# Patient Record
Sex: Male | Born: 1945 | Race: White | Hispanic: No | Marital: Married | Smoking: Former smoker
Health system: Southern US, Community
[De-identification: ages and names within clinical notes are randomized; demographics above are authoritative.]

## PROBLEM LIST (undated history)

## (undated) DIAGNOSIS — E785 Hyperlipidemia, unspecified: Secondary | ICD-10-CM

## (undated) DIAGNOSIS — R519 Headache, unspecified: Secondary | ICD-10-CM

## (undated) DIAGNOSIS — I10 Essential (primary) hypertension: Secondary | ICD-10-CM

## (undated) DIAGNOSIS — G8929 Other chronic pain: Secondary | ICD-10-CM

## (undated) DIAGNOSIS — G4700 Insomnia, unspecified: Secondary | ICD-10-CM

## (undated) DIAGNOSIS — N189 Chronic kidney disease, unspecified: Secondary | ICD-10-CM

## (undated) DIAGNOSIS — H269 Unspecified cataract: Secondary | ICD-10-CM

## (undated) DIAGNOSIS — C61 Malignant neoplasm of prostate: Secondary | ICD-10-CM

## (undated) DIAGNOSIS — M542 Cervicalgia: Secondary | ICD-10-CM

## (undated) DIAGNOSIS — B019 Varicella without complication: Secondary | ICD-10-CM

## (undated) DIAGNOSIS — M1990 Unspecified osteoarthritis, unspecified site: Secondary | ICD-10-CM

## (undated) DIAGNOSIS — M549 Dorsalgia, unspecified: Secondary | ICD-10-CM

## (undated) DIAGNOSIS — G709 Myoneural disorder, unspecified: Secondary | ICD-10-CM

## (undated) DIAGNOSIS — C3490 Malignant neoplasm of unspecified part of unspecified bronchus or lung: Secondary | ICD-10-CM

## (undated) DIAGNOSIS — R51 Headache: Secondary | ICD-10-CM

## (undated) DIAGNOSIS — Z87442 Personal history of urinary calculi: Secondary | ICD-10-CM

## (undated) HISTORY — DX: Headache: R51

## (undated) HISTORY — DX: Unspecified osteoarthritis, unspecified site: M19.90

## (undated) HISTORY — DX: Varicella without complication: B01.9

## (undated) HISTORY — DX: Unspecified cataract: H26.9

## (undated) HISTORY — DX: Insomnia, unspecified: G47.00

## (undated) HISTORY — DX: Other chronic pain: G89.29

## (undated) HISTORY — PX: PROSTATECTOMY: SHX69

## (undated) HISTORY — DX: Dorsalgia, unspecified: M54.9

## (undated) HISTORY — DX: Essential (primary) hypertension: I10

## (undated) HISTORY — DX: Personal history of urinary calculi: Z87.442

## (undated) HISTORY — DX: Cervicalgia: M54.2

## (undated) HISTORY — DX: Myoneural disorder, unspecified: G70.9

## (undated) HISTORY — DX: Hyperlipidemia, unspecified: E78.5

## (undated) HISTORY — DX: Chronic kidney disease, unspecified: N18.9

## (undated) HISTORY — DX: Malignant neoplasm of prostate: C61

---
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
## 1972-02-26 HISTORY — PX: BACK SURGERY: SHX140

## 1997-06-09 ENCOUNTER — Other Ambulatory Visit: Admission: RE | Admit: 1997-06-09 | Discharge: 1997-06-09 | Payer: Self-pay | Admitting: Urology

## 1997-07-13 ENCOUNTER — Inpatient Hospital Stay (HOSPITAL_COMMUNITY): Admission: RE | Admit: 1997-07-13 | Discharge: 1997-07-20 | Payer: Self-pay | Admitting: Urology

## 1997-09-22 ENCOUNTER — Ambulatory Visit (HOSPITAL_COMMUNITY): Admission: RE | Admit: 1997-09-22 | Discharge: 1997-09-22 | Payer: Self-pay | Admitting: Urology

## 2000-02-26 HISTORY — PX: NECK SURGERY: SHX720

## 2001-02-05 ENCOUNTER — Encounter: Payer: Self-pay | Admitting: Neurosurgery

## 2001-02-09 ENCOUNTER — Encounter: Payer: Self-pay | Admitting: Neurosurgery

## 2001-02-09 ENCOUNTER — Inpatient Hospital Stay (HOSPITAL_COMMUNITY): Admission: RE | Admit: 2001-02-09 | Discharge: 2001-02-10 | Payer: Self-pay | Admitting: Neurosurgery

## 2007-10-09 ENCOUNTER — Encounter: Admission: RE | Admit: 2007-10-09 | Discharge: 2007-10-09 | Payer: Self-pay | Admitting: Nephrology

## 2010-07-13 NOTE — Op Note (Signed)
[place]  Patient:    Jerry Wong, Jerry Wong Visit Number: [id number] MRN: [id number]          Service Type: SUR Location: [id number] Attending Physician:  Barton Fanny Dictated by:   Hewitt Shorts, M.D. Proc. Date: 02/09/01 Admit Date:  02/09/2001                             Operative Report  PREOPERATIVE DIAGNOSIS:  Cervical myelopathy, spondylosis, degenerative disk disease and disk herniation.  POSTOPERATIVE DIAGNOSIS:  Cervical myelopathy, spondylosis, degenerative disk disease and disk herniation.  OPERATION PERFORMED:  C4-5 anterior cervical diskectomy and arthrodesis with iliac crest allograft and Trinica cervical plating.  SURGEON:  Hewitt Shorts, M.D.  ASSISTANT:  Payton Doughty, M.D.  ANESTHESIA:  General endotracheal.  INDICATIONS FOR PROCEDURE:  The patient is a 65 year old man who presented with myelopathy, quadriparesis with hyperreflexia and clonus.  MRI revealed significant compression of the cord at the C4-5 level with increased signal ____________ in the cord.  A decision was made to proceed with a single level anterior cervical diskectomy and arthrodesis.  DESCRIPTION OF PROCEDURE:  The patient was brought to the operating room and placed under general endotracheal anesthesia.  The patient was placed in 10 pounds of halter traction and the neck was prepped with Betadine soap and solution and draped in sterile fashion.  A horizontal was made in the left side of the neck.  The line of the incision was infiltrated with local anesthetic with epinephrine.  Incision was made with a Shaw scalpel at a temperature of 120.  Dissection was carried down through subcutaneous tissues and platysma.  Dissection was carried down to an avascular plane leaving the sternocleidomastoid, carotid artery and jugular vein laterally and trachea and esophagus medially.  The ventral aspect of the vertebral column was identified and a  localizing x-ray was taken.  The C4-5 disk space was identified and anterior osteophytic overgrowth was removed and the disk space entered. n The microscope was draped and brought into the field to provide additional magnification, illumination and visualization.  The remainder of the procedure was performed using microdissection and microsurgical technique.  There was significant spondylitic degeneration of the disk space.  Using a Micromax drill, the cauterized end plates of the vertebral bodies were removed. Posterior osteophytic overgrowth was removed using a Micromax drill and then a 2 mm Kerrison punch with a thin foot plate and posterior longitudinal ligament was removed and in the end, good decompression of the thecal sac and foramina was achieved.  The wound was irrigated with bacitracin solution throughout the procedure.  Once the decompression was completed, we proceeded with the arthrodesis.  A 7 mm wedge of iliac crest allograft was cut and shaped to size and positioned in the intervertebral disk space and countersunk.  A 24 mm Trinica cervical plate was selected and positioned over the fusion construct and secured to the vertebra with a pair of 4.2 x 16 mm screws in each vertebra. Each screw hole was drilled, tapped and the screw placed.  Once all four screws were secured, the locking system was secured and then the wound was irrigated with bacitracin solution, checked for hemostasis once again.  An x-ray was taken which showed the graft in position, the plate and screws in position and the overall alignment was good.  We then proceeded with closure. The platysma was closed with interrupted inverted 2-0  undyed Vicryl sutures. The subcutaneous and subcuticular layer were closed with interrupted inverted 3-0 undyed Vicryl sutures and the skin was approximated with Dermabond.  The patient tolerated the procedure well. Estimated blood loss for this procedure was less than 50 cc.    Sponge, needle and instrument counts were correct. Following surgery the patient was placed in a soft cervical collar.  His traction had been discontinued. He was reversed from anesthetic, extubated and transferred to recovery room for further care where he was noted to be moving all four extremities. Dictated by:   Hewitt Shorts, M.D. Attending Physician:  Barton Fanny DD:  02/09/01 TD:  02/09/01 Job: [id number]

## 2012-08-06 ENCOUNTER — Other Ambulatory Visit: Payer: Self-pay | Admitting: Neurology

## 2012-08-21 ENCOUNTER — Ambulatory Visit (INDEPENDENT_AMBULATORY_CARE_PROVIDER_SITE_OTHER): Payer: Medicare Other | Admitting: Nurse Practitioner

## 2012-08-21 ENCOUNTER — Encounter: Payer: Self-pay | Admitting: Nurse Practitioner

## 2012-08-21 VITALS — BP 111/70 | HR 72 | Ht 69.0 in | Wt 207.0 lb

## 2012-08-21 DIAGNOSIS — G8929 Other chronic pain: Secondary | ICD-10-CM | POA: Insufficient documentation

## 2012-08-21 DIAGNOSIS — M4802 Spinal stenosis, cervical region: Secondary | ICD-10-CM

## 2012-08-21 DIAGNOSIS — G43109 Migraine with aura, not intractable, without status migrainosus: Secondary | ICD-10-CM

## 2012-08-21 DIAGNOSIS — M542 Cervicalgia: Secondary | ICD-10-CM | POA: Insufficient documentation

## 2012-08-21 MED ORDER — TIZANIDINE HCL 4 MG PO CAPS: 4.0000 mg | ORAL_CAPSULE | Freq: Two times a day (BID) | ORAL | Status: DC

## 2012-08-21 MED ORDER — AMITRIPTYLINE HCL 75 MG PO TABS: 75.0000 mg | ORAL_TABLET | Freq: Every day | ORAL | Status: DC

## 2012-08-21 NOTE — Patient Instructions (Addendum)
Continue tizanidine 4 mg twice daily Continue amitriptyline 75 daily Continue neck exercises Followup yearly and when necessary

## 2012-08-21 NOTE — Progress Notes (Signed)
HPI: He returns for followup after last visit 04/04/11.  He has a history of long-standing headaches which have responded well to amitriptyline and Tizanidine.    He has a history of high blood pressure and gout.  He has  attempted to wean himself off his medications and severe headaches would return. Has history of neck surgery and back surgery in the past. Has occasional tingling and numbness that is very intermittent, difficulty walking but he is not using any type of assistive device. Headaches are well controlled, needs refills  ROS:  Negative  Physical Exam General: well developed, well nourished, seated, in no evident distress Head: head normocephalic and atraumatic. Oropharynx benign Neck: supple with no carotid  bruits Cardiovascular: regular rate and rhythm, no murmurs  Neurologic Exam Mental Status: Awake and fully alert. Oriented to place and time. Follows all commands. Speech and language normal.   Cranial Nerves: . Pupils equal, briskly reactive to light. Extraocular movements full without nystagmus. Visual fields full to confrontation. Hearing intact and symmetric to finger snap. Facial sensation intact. Face, tongue, palate move normally and symmetrically. Neck flexion and extension normal.  Motor: Normal bulk and tone. Normal strength in all tested extremity muscles.No focal weakness Sensory.: intact to touch and pinprick and vibratory.  Coordination: Rapid alternating movements normal in all extremities. Finger-to-nose and heel-to-shin performed accurately bilaterally. Gait and Station: Arises from chair without difficulty. Stance is normal. Gait demonstrates normal stride length and balance . Able to heel, toe and tandem walk without difficulty.  Reflexes: 1+ and symmetric. Toes downgoing.     ASSESSMENT: Headache, DDD, spinal stenosis     PLAN: Continue tizanidine 4 mg twice daily Continue amitriptyline 75 daily Continue neck exercises Followup yearly and when  necessary Nilda Riggs, GNP-BC APRN

## 2012-10-02 ENCOUNTER — Other Ambulatory Visit: Payer: Self-pay

## 2012-10-02 MED ORDER — AMITRIPTYLINE HCL 75 MG PO TABS: 75.0000 mg | ORAL_TABLET | Freq: Every day | ORAL | Status: DC

## 2012-10-02 NOTE — Telephone Encounter (Signed)
Per pharmacy, patient requests 90 day Rx.  

## 2012-11-30 ENCOUNTER — Other Ambulatory Visit: Payer: Self-pay | Admitting: Urology

## 2012-11-30 ENCOUNTER — Ambulatory Visit
Admission: RE | Admit: 2012-11-30 | Discharge: 2012-11-30 | Disposition: A | Discharge status: Home / Self Care | Payer: Medicare Other | Source: Ambulatory Visit | Attending: Urology | Admitting: Urology

## 2012-11-30 DIAGNOSIS — Z8546 Personal history of malignant neoplasm of prostate: Secondary | ICD-10-CM

## 2013-05-11 DIAGNOSIS — G894 Chronic pain syndrome: Secondary | ICD-10-CM | POA: Diagnosis not present

## 2013-05-11 DIAGNOSIS — Z79899 Other long term (current) drug therapy: Secondary | ICD-10-CM | POA: Diagnosis not present

## 2013-05-11 DIAGNOSIS — M961 Postlaminectomy syndrome, not elsewhere classified: Secondary | ICD-10-CM | POA: Diagnosis not present

## 2013-07-06 DIAGNOSIS — M961 Postlaminectomy syndrome, not elsewhere classified: Secondary | ICD-10-CM | POA: Diagnosis not present

## 2013-07-06 DIAGNOSIS — G894 Chronic pain syndrome: Secondary | ICD-10-CM | POA: Diagnosis not present

## 2013-07-29 ENCOUNTER — Other Ambulatory Visit: Payer: Self-pay

## 2013-07-29 MED ORDER — TIZANIDINE HCL 4 MG PO CAPS: 4.0000 mg | ORAL_CAPSULE | Freq: Two times a day (BID) | ORAL | Status: DC

## 2013-08-09 DIAGNOSIS — C61 Malignant neoplasm of prostate: Secondary | ICD-10-CM | POA: Diagnosis not present

## 2013-08-09 DIAGNOSIS — R972 Elevated prostate specific antigen [PSA]: Secondary | ICD-10-CM | POA: Diagnosis not present

## 2013-08-09 DIAGNOSIS — N200 Calculus of kidney: Secondary | ICD-10-CM | POA: Diagnosis not present

## 2013-08-18 ENCOUNTER — Other Ambulatory Visit: Payer: Self-pay

## 2013-08-18 MED ORDER — AMITRIPTYLINE HCL 75 MG PO TABS: 75.0000 mg | ORAL_TABLET | Freq: Every day | ORAL | Status: DC

## 2013-08-24 ENCOUNTER — Encounter: Payer: Self-pay | Admitting: Neurology

## 2013-08-24 ENCOUNTER — Ambulatory Visit (INDEPENDENT_AMBULATORY_CARE_PROVIDER_SITE_OTHER): Payer: Medicare Other | Admitting: Neurology

## 2013-08-24 ENCOUNTER — Encounter (INDEPENDENT_AMBULATORY_CARE_PROVIDER_SITE_OTHER): Payer: Self-pay

## 2013-08-24 ENCOUNTER — Ambulatory Visit: Payer: Medicare Other | Admitting: Neurology

## 2013-08-24 VITALS — BP 122/68 | HR 72 | Resp 18 | Ht 67.75 in | Wt 201.0 lb

## 2013-08-24 DIAGNOSIS — R51 Headache: Secondary | ICD-10-CM | POA: Diagnosis not present

## 2013-08-24 DIAGNOSIS — Z5181 Encounter for therapeutic drug level monitoring: Secondary | ICD-10-CM | POA: Diagnosis not present

## 2013-08-24 DIAGNOSIS — R519 Headache, unspecified: Secondary | ICD-10-CM | POA: Insufficient documentation

## 2013-08-24 MED ORDER — AMITRIPTYLINE HCL 75 MG PO TABS: 75.0000 mg | ORAL_TABLET | Freq: Every day | ORAL | Status: DC

## 2013-08-24 MED ORDER — TIZANIDINE HCL 4 MG PO CAPS: 4.0000 mg | ORAL_CAPSULE | Freq: Two times a day (BID) | ORAL | Status: DC

## 2013-08-24 NOTE — Patient Instructions (Signed)
Spinal Stenosis Spinal stenosis is an abnormal narrowing of the canals of your spine (vertebrae). CAUSES  Spinal stenosis is caused by areas of bone pushing into the central canals of your vertebrae. This condition can be present at birth (congenital). It also may be caused by arthritic deterioration of your vertebrae (spinal degeneration).  SYMPTOMS   Pain that is generally worse with activities, particularly standing and walking.  Numbness, tingling, hot or cold sensations, weakness, or weariness in your legs.  Frequent episodes of falling.  A foot-slapping gait that leads to muscle weakness. DIAGNOSIS  Spinal stenosis is diagnosed with the use of magnetic resonance imaging (MRI) or computed tomography (CT). TREATMENT  Initial therapy for spinal stenosis focuses on the management of the pain and other symptoms associated with the condition. These therapies include:  Practicing postural changes to lessen pressure on your nerves.  Exercises to strengthen the core of your body.  Loss of excess body weight.  The use of nonsteroidal anti-inflammatory medications to reduce swelling and inflammation in your nerves. When therapies to manage pain are not successful, surgery to treat spinal stenosis may be recommended. This surgery involves removing excess bone, which puts pressure on your nerve roots. During this surgery (laminectomy), the posterior boney arch (lamina) and excess bone around the facet joints are removed. Document Released: 05/04/2003 Document Revised: 06/08/2012 Document Reviewed: 05/22/2012 [facility] Patient Information 2015 [place], [place]. This information is not intended to replace advice given to you by your health care provider. Make sure you discuss any questions you have with your health care provider.

## 2013-08-24 NOTE — Progress Notes (Signed)
HPI: He returns for followup after last visit 08/21/12 with Cecille Rubin, [place].  This caucasian, married, right handed male patient has a history of long-standing headaches which have responded well to amitriptyline and Tizanidine.     He has attempted to wean himself off his medications and severe headaches would return. Has history of neck surgery and back surgery in the past. He has a history of high blood pressure and gout- both controlled on medication.  He underwent additional tests for back pain in the last year, followed by his Urologist. This patient has prostate cancer.     ROS:  Has occasional tingling and numbness in feet fingers and hands, very intermittent, difficulty walking- neck surgery .  but he is not using any type of assistive device.   Headaches are well controlled, needs refills   Physical Exam General: well developed, well nourished, seated, in no evident distress Head: head normocephalic and atraumatic. Oropharynx benign Neck: supple with no carotid Bruits, neck circumference 14.5 inches. [place] 3  Cardiovascular: regular rate and rhythm, no murmurs.  Neurologic Exam Mental Status: Awake and fully alert. Oriented to place and time. Follows all commands. Speech and language normal.   Cranial Nerves: . Pupils equal, briskly reactive to light. Extraocular movements full without nystagmus. Visual fields full to confrontation. Hearing intact and symmetric to finger snap. Facial sensation intact. Face, tongue, palate move normally and symmetrically. Neck flexion and extension normal.  Motor: Normal bulk and tone. Normal strength in all tested extremity muscles.No focal weakness Sensory.: intact to touch and pinprick and vibratory.  Coordination: Rapid alternating movements normal in all extremities.  Finger-to-nose and heel-to-shin performed accurately bilaterally. Gait and Station: Arises from chair without difficulty. Stance is normal.  Gait demonstrates normal  stride length and balance . ty.  Reflexes: 1+ and symmetric. Toes downgoing.     ASSESSMENT: Headache, resolved on current medications- needs refills,  DDD, spinal stenosis     PLAN: Continue tizanidine 4 mg twice daily Continue amitriptyline 75 daily Continue neck exercises Followup yearly and when necessary Larey Seat, MD

## 2013-08-30 ENCOUNTER — Telehealth: Payer: Self-pay | Admitting: Neurology

## 2013-08-30 NOTE — Telephone Encounter (Signed)
I called back.  Got no answer.  Left message asking if his ins has a preferred drug listed that they will cover.

## 2013-08-30 NOTE — Telephone Encounter (Signed)
Please advise. Thanks.  

## 2013-08-30 NOTE — Telephone Encounter (Signed)
Patient called regarding his prescription of  tizanidine 4 mg twice daily - his insurance will not cover this med - $189 self pay. He would like something else called in. Please call to advise

## 2013-08-31 DIAGNOSIS — M961 Postlaminectomy syndrome, not elsewhere classified: Secondary | ICD-10-CM | POA: Diagnosis not present

## 2013-08-31 DIAGNOSIS — G894 Chronic pain syndrome: Secondary | ICD-10-CM | POA: Diagnosis not present

## 2013-08-31 DIAGNOSIS — Z79899 Other long term (current) drug therapy: Secondary | ICD-10-CM | POA: Diagnosis not present

## 2013-08-31 MED ORDER — TIZANIDINE HCL 4 MG PO TABS: 4.0000 mg | ORAL_TABLET | Freq: Two times a day (BID) | ORAL | Status: DC

## 2013-08-31 NOTE — Telephone Encounter (Signed)
Rx has been sent to [facility].  I called the patient back, got no answer at home.  Called cell and was told he was not at that number.  I called pharmacy and asked they contact the patient when Rx is ready for pick up.

## 2013-08-31 NOTE — Telephone Encounter (Signed)
Patient called and stated insurance will cover Tizanidine 4 mg in tablets only.  Rx was written for capsules.  Please send Rx to [facility] on [address].  Please call and advise

## 2013-10-25 DIAGNOSIS — N182 Chronic kidney disease, stage 2 (mild): Secondary | ICD-10-CM | POA: Diagnosis not present

## 2013-10-25 DIAGNOSIS — M109 Gout, unspecified: Secondary | ICD-10-CM | POA: Diagnosis not present

## 2013-10-25 DIAGNOSIS — E785 Hyperlipidemia, unspecified: Secondary | ICD-10-CM | POA: Diagnosis not present

## 2013-10-25 DIAGNOSIS — I129 Hypertensive chronic kidney disease with stage 1 through stage 4 chronic kidney disease, or unspecified chronic kidney disease: Secondary | ICD-10-CM | POA: Diagnosis not present

## 2013-10-26 DIAGNOSIS — Z79899 Other long term (current) drug therapy: Secondary | ICD-10-CM | POA: Diagnosis not present

## 2013-10-26 DIAGNOSIS — M961 Postlaminectomy syndrome, not elsewhere classified: Secondary | ICD-10-CM | POA: Diagnosis not present

## 2013-10-26 DIAGNOSIS — G894 Chronic pain syndrome: Secondary | ICD-10-CM | POA: Diagnosis not present

## 2013-12-21 DIAGNOSIS — M47816 Spondylosis without myelopathy or radiculopathy, lumbar region: Secondary | ICD-10-CM | POA: Diagnosis not present

## 2013-12-21 DIAGNOSIS — M47812 Spondylosis without myelopathy or radiculopathy, cervical region: Secondary | ICD-10-CM | POA: Diagnosis not present

## 2013-12-21 DIAGNOSIS — M961 Postlaminectomy syndrome, not elsewhere classified: Secondary | ICD-10-CM | POA: Diagnosis not present

## 2013-12-21 DIAGNOSIS — G894 Chronic pain syndrome: Secondary | ICD-10-CM | POA: Diagnosis not present

## 2013-12-27 DIAGNOSIS — N183 Chronic kidney disease, stage 3 (moderate): Secondary | ICD-10-CM | POA: Diagnosis not present

## 2014-02-15 DIAGNOSIS — M47816 Spondylosis without myelopathy or radiculopathy, lumbar region: Secondary | ICD-10-CM | POA: Diagnosis not present

## 2014-02-15 DIAGNOSIS — G894 Chronic pain syndrome: Secondary | ICD-10-CM | POA: Diagnosis not present

## 2014-02-15 DIAGNOSIS — M47812 Spondylosis without myelopathy or radiculopathy, cervical region: Secondary | ICD-10-CM | POA: Diagnosis not present

## 2014-02-15 DIAGNOSIS — M961 Postlaminectomy syndrome, not elsewhere classified: Secondary | ICD-10-CM | POA: Diagnosis not present

## 2014-02-28 DIAGNOSIS — R7309 Other abnormal glucose: Secondary | ICD-10-CM | POA: Diagnosis not present

## 2014-02-28 DIAGNOSIS — E782 Mixed hyperlipidemia: Secondary | ICD-10-CM | POA: Diagnosis not present

## 2014-02-28 DIAGNOSIS — Z23 Encounter for immunization: Secondary | ICD-10-CM | POA: Diagnosis not present

## 2014-02-28 DIAGNOSIS — N183 Chronic kidney disease, stage 3 (moderate): Secondary | ICD-10-CM | POA: Diagnosis not present

## 2014-02-28 DIAGNOSIS — C61 Malignant neoplasm of prostate: Secondary | ICD-10-CM | POA: Diagnosis not present

## 2014-02-28 DIAGNOSIS — M109 Gout, unspecified: Secondary | ICD-10-CM | POA: Diagnosis not present

## 2014-02-28 DIAGNOSIS — I129 Hypertensive chronic kidney disease with stage 1 through stage 4 chronic kidney disease, or unspecified chronic kidney disease: Secondary | ICD-10-CM | POA: Diagnosis not present

## 2014-02-28 DIAGNOSIS — Z1389 Encounter for screening for other disorder: Secondary | ICD-10-CM | POA: Diagnosis not present

## 2014-02-28 DIAGNOSIS — Z1211 Encounter for screening for malignant neoplasm of colon: Secondary | ICD-10-CM | POA: Diagnosis not present

## 2014-04-12 DIAGNOSIS — G894 Chronic pain syndrome: Secondary | ICD-10-CM | POA: Diagnosis not present

## 2014-04-12 DIAGNOSIS — M47816 Spondylosis without myelopathy or radiculopathy, lumbar region: Secondary | ICD-10-CM | POA: Diagnosis not present

## 2014-04-12 DIAGNOSIS — M47812 Spondylosis without myelopathy or radiculopathy, cervical region: Secondary | ICD-10-CM | POA: Diagnosis not present

## 2014-04-12 DIAGNOSIS — M961 Postlaminectomy syndrome, not elsewhere classified: Secondary | ICD-10-CM | POA: Diagnosis not present

## 2014-06-07 DIAGNOSIS — M47816 Spondylosis without myelopathy or radiculopathy, lumbar region: Secondary | ICD-10-CM | POA: Diagnosis not present

## 2014-06-07 DIAGNOSIS — M961 Postlaminectomy syndrome, not elsewhere classified: Secondary | ICD-10-CM | POA: Diagnosis not present

## 2014-06-07 DIAGNOSIS — M47812 Spondylosis without myelopathy or radiculopathy, cervical region: Secondary | ICD-10-CM | POA: Diagnosis not present

## 2014-06-07 DIAGNOSIS — G894 Chronic pain syndrome: Secondary | ICD-10-CM | POA: Diagnosis not present

## 2014-07-19 ENCOUNTER — Other Ambulatory Visit: Payer: Self-pay | Admitting: Neurology

## 2014-07-19 NOTE — Telephone Encounter (Signed)
Has appt scheduled.

## 2014-08-02 DIAGNOSIS — G894 Chronic pain syndrome: Secondary | ICD-10-CM | POA: Diagnosis not present

## 2014-08-02 DIAGNOSIS — Z79891 Long term (current) use of opiate analgesic: Secondary | ICD-10-CM | POA: Diagnosis not present

## 2014-08-02 DIAGNOSIS — M47812 Spondylosis without myelopathy or radiculopathy, cervical region: Secondary | ICD-10-CM | POA: Diagnosis not present

## 2014-08-02 DIAGNOSIS — M47816 Spondylosis without myelopathy or radiculopathy, lumbar region: Secondary | ICD-10-CM | POA: Diagnosis not present

## 2014-08-07 ENCOUNTER — Other Ambulatory Visit: Payer: Self-pay | Admitting: Neurology

## 2014-08-25 ENCOUNTER — Ambulatory Visit (INDEPENDENT_AMBULATORY_CARE_PROVIDER_SITE_OTHER): Payer: Medicare Other | Admitting: Nurse Practitioner

## 2014-08-25 ENCOUNTER — Encounter: Payer: Self-pay | Admitting: Nurse Practitioner

## 2014-08-25 VITALS — BP 117/70 | HR 64 | Ht 69.0 in | Wt 185.2 lb

## 2014-08-25 DIAGNOSIS — M4802 Spinal stenosis, cervical region: Secondary | ICD-10-CM

## 2014-08-25 DIAGNOSIS — G43109 Migraine with aura, not intractable, without status migrainosus: Secondary | ICD-10-CM

## 2014-08-25 MED ORDER — TIZANIDINE HCL 4 MG PO TABS: 4.0000 mg | ORAL_TABLET | Freq: Two times a day (BID) | ORAL | Status: DC

## 2014-08-25 MED ORDER — AMITRIPTYLINE HCL 75 MG PO TABS: ORAL_TABLET | ORAL | Status: DC

## 2014-08-25 NOTE — Progress Notes (Addendum)
[facility]  PATIENT: Jerry Wong DOB: [birth year]   REASON FOR VISIT: follow-up for history of headaches HISTORY FROM:Patient    HISTORY OF PRESENT ILLNESS:Jerry Wong, 69 year old male returns for follow-up.He has a history of long-standing headaches which have responded well to amitriptyline and Tizanidine.  He has attempted to wean himself off his medications and severe headaches would return.Has history of neck surgery and back surgery in the past. He has a history of high blood pressure and gout- both controlled on medication. He underwent additional tests for back pain in the last year.  He complains with his left eye drooping at times, he denies any double vision blurred vision. He wears glasses. I do not see any drooping of his eye todayThis patient has prostate cancer.    REVIEW OF SYSTEMS: Full 14 system review of systems performed and notable only for those listed, all others are neg:  Constitutional: neg  Cardiovascular: neg Ear/Nose/Throat: neg  Skin: neg Eyes: neg Respiratory: neg Gastroitestinal: neg  Hematology/Lymphatic: neg  Endocrine: neg Musculoskeletal:back pain, neck stiffness Allergy/Immunology: neg Neurological: neg Psychiatric: neg Sleep : neg   ALLERGIES: No Known Allergies  HOME MEDICATIONS: Outpatient Prescriptions Prior to Visit  Medication Sig Dispense Refill  . amitriptyline (ELAVIL) 75 MG tablet TAKE 1 TABLET (75 MG TOTAL) BY MOUTH AT BEDTIME. 30 tablet 0  . amLODipine (NORVASC) 5 MG tablet Take 5 mg by mouth daily.     . carvedilol (COREG) 25 MG tablet Take 25 mg by mouth 2 (two) times daily with a meal.     . hydrochlorothiazide (HYDRODIURIL) 25 MG tablet Take 25 mg by mouth daily.     Marland Kitchen oxyCODONE-acetaminophen (PERCOCET) 10-325 MG per tablet as needed.    Marland Kitchen tiZANidine (ZANAFLEX) 4 MG tablet TAKE 1 TABLET BY MOUTH TWICE A DAY 180 tablet 0   No facility-administered medications prior to visit.    PAST MEDICAL  HISTORY: Past Medical History  Diagnosis Date  . Headache(784.0)     PAST SURGICAL HISTORY: History reviewed. No pertinent past surgical history.  FAMILY HISTORY: History reviewed. No pertinent family history.  SOCIAL HISTORY: History   Social History  . Marital Status: Married    Spouse Name: [name]  . Number of Children: 3  . Years of Education: 12   Occupational History  . Not on file.   Social History Main Topics  . Smoking status: Former Research scientist (life sciences)  . Smokeless tobacco: Never Used  . Alcohol Use: No  . Drug Use: No  . Sexual Activity: Not on file   Other Topics Concern  . Not on file   Social History Narrative   Patient is married [name]) and lives at home with his wife and son.   Patient has three children.   Patient is retired.   Patient has a high school education.   Patient is right-handed.   Patient drinks one cup of coffee every morning.     PHYSICAL EXAM  Filed Vitals:   08/25/14 1454  BP: 117/70  Pulse: 64  Height: '5\' 9"'$  (1.753 m)  Weight: 185 lb 3.2 oz (84.006 kg)   Body mass index is 27.34 kg/(m^2). General: well developed, well nourished, seated, in no evident distress Head: head normocephalic and atraumatic. Oropharynx benign Neck: supple with no carotid Bruits,  Neurologic Exam Mental Status: Awake and fully alert. Oriented to place and time. Follows all commands. Speech and language normal.  Cranial Nerves: . Pupils equal, briskly reactive to light. Extraocular movements full without nystagmus.  Visual fields full to confrontation.No ptosis Hearing intact and symmetric to finger snap. Facial sensation intact. Face, tongue, palate move normally and symmetrically. Neck flexion and extension normal.  Motor: Normal bulk and tone. Normal strength in all tested extremity muscles.No focal weakness Sensory.: intact to touch and pinprick and vibratory.  Coordination: Rapid alternating movements normal in all extremities. Finger-to-nose and  heel-to-shin performed accurately bilaterally. Gait and Station: Arises from chair without difficulty. Stance is normal. Gait demonstrates normal stride length and balance .  Reflexes: 1+ and symmetric. Toes downgoing.     DIAGNOSTIC DATA (LABS, IMAGING, TESTING) - ASSESSMENT AND PLAN  69 y.o. year old male  has a past medical history of Headache(784.0). here to follow-up. His headaches are well controlled.The patient is a current patient of Dr. Brett Fairy  who is out of the office today . This note is sent to the work in doctor.     Continue amitriptyline daily will refill Continue tizanidine  twice daily will refill Follow-up yearly and when necessary Dennie Bible, [facility], [facility], APRN  [facility] [address], [place], [place] [address] [phone]  I reviewed the above note and documentation by the Nurse Practitioner and agree with the history, physical exam, assessment and plan as outlined above. I was immediately available for face-to-face consultation. Star Age, MD, PhD [facility])

## 2014-08-25 NOTE — Patient Instructions (Signed)
Headaches in good  Control Continue amitriptyline daily will refill Continue tizanidine  twice daily will refill Follow-up yearly and when necessary

## 2014-09-27 DIAGNOSIS — Z79891 Long term (current) use of opiate analgesic: Secondary | ICD-10-CM | POA: Diagnosis not present

## 2014-09-27 DIAGNOSIS — M47816 Spondylosis without myelopathy or radiculopathy, lumbar region: Secondary | ICD-10-CM | POA: Diagnosis not present

## 2014-09-27 DIAGNOSIS — M47812 Spondylosis without myelopathy or radiculopathy, cervical region: Secondary | ICD-10-CM | POA: Diagnosis not present

## 2014-09-27 DIAGNOSIS — G894 Chronic pain syndrome: Secondary | ICD-10-CM | POA: Diagnosis not present

## 2014-10-11 ENCOUNTER — Other Ambulatory Visit: Payer: Self-pay | Admitting: Neurology

## 2014-11-22 DIAGNOSIS — G894 Chronic pain syndrome: Secondary | ICD-10-CM | POA: Diagnosis not present

## 2014-11-22 DIAGNOSIS — M47812 Spondylosis without myelopathy or radiculopathy, cervical region: Secondary | ICD-10-CM | POA: Diagnosis not present

## 2014-11-22 DIAGNOSIS — Z79891 Long term (current) use of opiate analgesic: Secondary | ICD-10-CM | POA: Diagnosis not present

## 2014-11-22 DIAGNOSIS — M47816 Spondylosis without myelopathy or radiculopathy, lumbar region: Secondary | ICD-10-CM | POA: Diagnosis not present

## 2015-01-17 DIAGNOSIS — Z79891 Long term (current) use of opiate analgesic: Secondary | ICD-10-CM | POA: Diagnosis not present

## 2015-01-17 DIAGNOSIS — M47816 Spondylosis without myelopathy or radiculopathy, lumbar region: Secondary | ICD-10-CM | POA: Diagnosis not present

## 2015-01-17 DIAGNOSIS — G894 Chronic pain syndrome: Secondary | ICD-10-CM | POA: Diagnosis not present

## 2015-01-17 DIAGNOSIS — M47812 Spondylosis without myelopathy or radiculopathy, cervical region: Secondary | ICD-10-CM | POA: Diagnosis not present

## 2015-02-15 DIAGNOSIS — Z23 Encounter for immunization: Secondary | ICD-10-CM | POA: Diagnosis not present

## 2015-07-20 ENCOUNTER — Other Ambulatory Visit: Payer: Self-pay | Admitting: Nurse Practitioner

## 2015-07-28 ENCOUNTER — Encounter: Payer: Self-pay | Admitting: Nurse Practitioner

## 2015-08-24 ENCOUNTER — Ambulatory Visit: Payer: Medicare Other | Admitting: Nurse Practitioner

## 2015-10-05 ENCOUNTER — Encounter: Payer: Self-pay | Admitting: Nurse Practitioner

## 2015-10-05 ENCOUNTER — Ambulatory Visit (INDEPENDENT_AMBULATORY_CARE_PROVIDER_SITE_OTHER): Payer: Medicare Other | Admitting: Nurse Practitioner

## 2015-10-05 VITALS — BP 163/80 | HR 71 | Ht 69.0 in | Wt 192.8 lb

## 2015-10-05 DIAGNOSIS — M4802 Spinal stenosis, cervical region: Secondary | ICD-10-CM | POA: Diagnosis not present

## 2015-10-05 DIAGNOSIS — G43109 Migraine with aura, not intractable, without status migrainosus: Secondary | ICD-10-CM | POA: Diagnosis not present

## 2015-10-05 MED ORDER — AMITRIPTYLINE HCL 75 MG PO TABS: ORAL_TABLET | ORAL | 3 refills | Status: DC

## 2015-10-05 MED ORDER — TIZANIDINE HCL 4 MG PO TABS: ORAL_TABLET | ORAL | 3 refills | Status: DC

## 2015-10-05 NOTE — Patient Instructions (Signed)
Continue amitriptyline daily will refill Continue tizanidine  twice daily will refill Follow-up yearly and when necessary

## 2015-10-05 NOTE — Progress Notes (Signed)
[facility]  PATIENT: Jerry Wong DOB: 1945-11-15   REASON FOR VISIT: follow-up for history of headaches HISTORY FROM:Patient    HISTORY OF PRESENT ILLNESS:Jerry Wong, 70 year old male returns for follow-up.He has a history of long-standing headaches which have responded well to amitriptyline and Tizanidine. He has attempted to wean himself off his medications and severe headaches would return.Has history of neck surgery and back surgery in the past. He has a history of high blood pressure and gout- both controlled on medication. He has not taken his blood pressure medicine today when it is elevated in the office, 163/80.This patient has prostate cancer. He returns for reevaluation. He has no new neurologic complaints.   REVIEW OF SYSTEMS: Full 14 system review of systems performed and notable only for those listed, all others are neg:  Constitutional: neg  Cardiovascular: neg Ear/Nose/Throat: neg  Skin: neg Eyes: neg Respiratory: neg Gastroitestinal: neg  Hematology/Lymphatic: neg  Endocrine: neg Musculoskeletal:back pain, neck stiffness Allergy/Immunology: neg Neurological: neg Psychiatric: neg Sleep : neg   ALLERGIES: No Known Allergies  HOME MEDICATIONS: Outpatient Medications Prior to Visit  Medication Sig Dispense Refill  . allopurinol (ZYLOPRIM) 300 MG tablet Take by mouth daily.     Marland Kitchen amitriptyline (ELAVIL) 75 MG tablet TAKE 1 TABLET (75 MG TOTAL) BY MOUTH AT BEDTIME. 90 tablet 3  . amLODipine (NORVASC) 5 MG tablet Take 5 mg by mouth daily.     . carvedilol (COREG) 25 MG tablet Take 25 mg by mouth 2 (two) times daily with a meal.     . fenofibrate 160 MG tablet Take 160 mg by mouth daily.     . hydrochlorothiazide (HYDRODIURIL) 25 MG tablet Take 25 mg by mouth daily.     Marland Kitchen oxyCODONE-acetaminophen (PERCOCET) 10-325 MG per tablet as needed.    . simvastatin (ZOCOR) 20 MG tablet Take 20 mg by mouth daily at 6 PM.     . tiZANidine (ZANAFLEX) 4 MG  tablet TAKE 1 TABLET (4 MG TOTAL) BY MOUTH 2 (TWO) TIMES DAILY. 180 tablet 3  . zolpidem (AMBIEN) 10 MG tablet Take 10 mg by mouth at bedtime as needed.  4  . amitriptyline (ELAVIL) 75 MG tablet TAKE 1 TABLET BY MOUTH EVERY DAY AT BEDTIME 90 tablet 2   No facility-administered medications prior to visit.     PAST MEDICAL HISTORY: Past Medical History:  Diagnosis Date  . Headache(784.0)     PAST SURGICAL HISTORY: History reviewed. No pertinent surgical history.  FAMILY HISTORY: History reviewed. No pertinent family history.  SOCIAL HISTORY: Social History   Social History  . Marital status: Married    Spouse name: [name]  . Number of children: 3  . Years of education: 12   Occupational History  . Not on file.   Social History Main Topics  . Smoking status: Former Research scientist (life sciences)  . Smokeless tobacco: Never Used  . Alcohol use No  . Drug use: No  . Sexual activity: Not on file   Other Topics Concern  . Not on file   Social History Narrative   Patient is married [name]) and lives at home with his wife and son.   Patient has three children.   Patient is retired.   Patient has a high school education.   Patient is right-handed.   Patient drinks one cup of coffee every morning.     PHYSICAL EXAM  Vitals:   10/05/15 1620  BP: (!) 163/80  Pulse: 71  Weight: 192 lb 12.8 oz (87.5  kg)  Height: '5\' 9"'$  (1.753 m)   Body mass index is 28.47 kg/m. General: well developed, well nourished, seated, in no evident distress Head: head normocephalic and atraumatic. Oropharynx benign Neck: supple with no carotid Bruits,  Neurologic Exam Mental Status: Awake and fully alert. Oriented to place and time. Follows all commands. Speech and language normal.  Cranial Nerves: . Pupils equal, briskly reactive to light. Extraocular movements full without nystagmus. Visual fields full to confrontation.No ptosis Hearing intact and symmetric to finger snap. Facial sensation intact. Face, tongue,  palate move normally and symmetrically. Neck flexion and extension normal.  Motor: Normal bulk and tone. Normal strength in all tested extremity muscles.No focal weakness Sensory.: intact to touch and pinprick and vibratory.  Coordination: Rapid alternating movements normal in all extremities. Finger-to-nose and heel-to-shin performed accurately bilaterally. Gait and Station: Arises from chair without difficulty. Stance is normal. Gait demonstrates normal stride length and balance .  Reflexes: 1+ and symmetric. Toes downgoing.     DIAGNOSTIC DATA (LABS, IMAGING, TESTING) - ASSESSMENT AND PLAN  70 y.o. year old male  has a past medical history of Headache(784.0). here to follow-up. His headaches are well controlled.   PLAN: Continue amitriptyline daily will refill Continue tizanidine  twice daily will refill Follow-up yearly and when necessary Dennie Bible, [facility], APRN  [facility] [address], [place], [place] [address] [phone]

## 2015-10-09 NOTE — Progress Notes (Signed)
I agree with the assessment and plan as directed by NP .The patient is not known to me, his Primary neurologist was not mentioned.   Margaretha Mahan, MD

## 2016-04-15 ENCOUNTER — Other Ambulatory Visit: Payer: Self-pay | Admitting: Neurology

## 2016-06-03 ENCOUNTER — Ambulatory Visit (INDEPENDENT_AMBULATORY_CARE_PROVIDER_SITE_OTHER): Payer: Medicare Other | Admitting: Primary Care

## 2016-06-03 ENCOUNTER — Encounter: Payer: Self-pay | Admitting: Primary Care

## 2016-06-03 ENCOUNTER — Encounter (INDEPENDENT_AMBULATORY_CARE_PROVIDER_SITE_OTHER): Payer: Self-pay

## 2016-06-03 VITALS — BP 146/80 | HR 81 | Temp 98.0°F | Ht 68.25 in | Wt 208.8 lb

## 2016-06-03 DIAGNOSIS — Z8546 Personal history of malignant neoplasm of prostate: Secondary | ICD-10-CM

## 2016-06-03 DIAGNOSIS — E785 Hyperlipidemia, unspecified: Secondary | ICD-10-CM

## 2016-06-03 DIAGNOSIS — G8929 Other chronic pain: Secondary | ICD-10-CM

## 2016-06-03 DIAGNOSIS — I10 Essential (primary) hypertension: Secondary | ICD-10-CM | POA: Diagnosis not present

## 2016-06-03 DIAGNOSIS — R51 Headache: Secondary | ICD-10-CM | POA: Diagnosis not present

## 2016-06-03 DIAGNOSIS — M549 Dorsalgia, unspecified: Secondary | ICD-10-CM | POA: Diagnosis not present

## 2016-06-03 DIAGNOSIS — M542 Cervicalgia: Secondary | ICD-10-CM

## 2016-06-03 DIAGNOSIS — R519 Headache, unspecified: Secondary | ICD-10-CM

## 2016-06-03 LAB — COMPREHENSIVE METABOLIC PANEL
ALT: 13 U/L (ref 0–53)
AST: 15 U/L (ref 0–37)
Albumin: 4.3 g/dL (ref 3.5–5.2)
Alkaline Phosphatase: 76 U/L (ref 39–117)
BUN: 22 mg/dL (ref 6–23)
CO2: 26 mEq/L (ref 19–32)
Calcium: 9.5 mg/dL (ref 8.4–10.5)
Chloride: 106 mEq/L (ref 96–112)
Creatinine, Ser: 1.43 mg/dL (ref 0.40–1.50)
GFR: 52.16 mL/min — ABNORMAL LOW (ref >60.00)
Glucose, Bld: 120 mg/dL — ABNORMAL HIGH (ref 70–99)
Potassium: 5.4 mEq/L — ABNORMAL HIGH (ref 3.5–5.1)
Sodium: 138 mEq/L (ref 135–145)
Total Bilirubin: 0.4 mg/dL (ref 0.2–1.2)
Total Protein: 7.3 g/dL (ref 6.0–8.3)

## 2016-06-03 LAB — LIPID PANEL
Cholesterol: 209 mg/dL — ABNORMAL HIGH (ref 0–200)
HDL: 39 mg/dL — ABNORMAL LOW (ref >39.00)
NonHDL: 170.23
Total CHOL/HDL Ratio: 5
Triglycerides: 224 mg/dL — ABNORMAL HIGH (ref 0.0–149.0)
VLDL: 44.8 mg/dL — ABNORMAL HIGH (ref 0.0–40.0)

## 2016-06-03 LAB — PSA: PSA: 10.82 ng/mL — ABNORMAL HIGH (ref 0.10–4.00)

## 2016-06-03 LAB — LDL CHOLESTEROL, DIRECT: Direct LDL: 135 mg/dL

## 2016-06-03 MED ORDER — HYDROCHLOROTHIAZIDE 25 MG PO TABS: 25.0000 mg | ORAL_TABLET | Freq: Every day | ORAL | 0 refills | Status: DC

## 2016-06-03 MED ORDER — AMLODIPINE BESYLATE 5 MG PO TABS: 5.0000 mg | ORAL_TABLET | Freq: Every day | ORAL | 0 refills | Status: DC

## 2016-06-03 NOTE — Assessment & Plan Note (Signed)
Following with Neurology, feels well managed. Continue Tizanidine and and Amitriptyline.

## 2016-06-03 NOTE — Assessment & Plan Note (Signed)
Check lipids today, will refill meds once results received.

## 2016-06-03 NOTE — Assessment & Plan Note (Signed)
Slightly above goal on wife's Lisinopril. Will refill his Amlodipine and HCTZ and start with that. If BP above goal then will add in Carvedilol. Will call in 2 weeks for readings.

## 2016-06-03 NOTE — Assessment & Plan Note (Signed)
Following with pain management. Feels well managed.

## 2016-06-03 NOTE — Progress Notes (Signed)
Subjective:    Patient ID: Jerry Wong, male    DOB: 03-31-1945, 71 y.o.   MRN: [id number]  HPI  Jerry Wong is a 71 year old male who presents today to establish care and discuss the problems mentioned below. Will obtain old records.  1) Essential Hypertension: Currently prescribed Amlodipine 5 mg, carvedilol 25 mg BID, and HCTZ 25 mg. His BP in the office today is 146/80. He ran out of his BP medications 3 months ago and has been taking some of his wife's Lisinopril twice daily (not sure of the strength). Prior to starting his wife's Lisinopril his BP was running 150-160/'s/80's. His BP on the Lisinopril is 120's/80's with home readings. His BP when on his other medications was running 120's/80's. He was once on lisinopril but was removed by nephrology due to renal impairment.   2) Hyperlipidemia: Currently managed on fenofibrate 160 mg and simvastatin 20 mg. His last lipid panel was over 1 year ago. He is needing refills of his simvastatin as he ran out three months ago. He's not had his fenofibrate in over 1 year.  3) Chronic Headaches: Currently following with [facility] and is managed on tizanidine and amitriptyline. His last visit with neurology was in August 2017.   4) Chronic Gout: Previously managed on allopurinol 300 mg once daily. His last flare was several years ago, he stopped taking this medication several years ago.   5) Chronic Neck and Back Pain: Currently following with pain management and is managed on Lyrica 75 mg twice daily and Percocet PRN.  6) Insomnia: Currently managed on Ambien 10 mg for which he takes most every night of the week. He will experience difficulty sleeping secondary to chronic back and neck pain if he doesn't have this medication. Pain management currently prescribing.   Review of Systems  Constitutional: Negative for fatigue.  Eyes: Negative for visual disturbance.  Respiratory: Negative for shortness of breath and wheezing.   Musculoskeletal:  Positive for back pain and neck pain.  Neurological: Negative for dizziness.       Headaches stable  Psychiatric/Behavioral: Negative for sleep disturbance.       Past Medical History:  Diagnosis Date  . Arthritis   . Chickenpox   . Headache(784.0)   . History of kidney stones   . Hyperlipidemia   . Hypertension   . Prostate cancer [facility])      Social History   Social History  . Marital status: Married    Spouse name: Jerry Wong  . Number of children: 3  . Years of education: 12   Occupational History  . Not on file.   Social History Main Topics  . Smoking status: Former Research scientist (life sciences)  . Smokeless tobacco: Never Used  . Alcohol use No  . Drug use: No  . Sexual activity: Not on file   Other Topics Concern  . Not on file   Social History Narrative   Patient is married Jerry Wong) and lives at home with his wife and son.   Patient has three children.   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   Patient has a high school education.   Patient is right-handed.   Patient drinks one cup of coffee every morning.    Past Surgical History:  Procedure Laterality Date  . BACK SURGERY  1974  . NECK SURGERY  2002  . PROSTATECTOMY    . TONSILLECTOMY  1958    Family History  Problem Relation Age of Onset  .  Arthritis Mother   . Lung cancer Mother   . Arthritis Father   . Lung cancer Father   . Hyperlipidemia Father   . Hypertension Father   . Stroke Father   . Prostate cancer Brother     No Known Allergies  Current Outpatient Prescriptions on File Prior to Visit  Medication Sig Dispense Refill  . amitriptyline (ELAVIL) 75 MG tablet TAKE 1 TABLET (75 MG TOTAL) BY MOUTH AT BEDTIME. 90 tablet 3  . aspirin EC 81 MG tablet Take 81 mg by mouth daily.     . carvedilol (COREG) 25 MG tablet Take 25 mg by mouth 2 (two) times daily with a meal.     . simvastatin (ZOCOR) 20 MG tablet Take 20 mg by mouth daily at 6 PM.     . tiZANidine (ZANAFLEX) 4 MG tablet TAKE 1 TABLET (4 MG  TOTAL) BY MOUTH 2 (TWO) TIMES DAILY. 180 tablet 3  . zolpidem (AMBIEN) 10 MG tablet Take 10 mg by mouth at bedtime as needed.  4  . fenofibrate 160 MG tablet Take 160 mg by mouth daily.     Marland Kitchen oxyCODONE-acetaminophen (PERCOCET) 10-325 MG per tablet Take 1 tablet by mouth as needed for pain.      No current facility-administered medications on file prior to visit.     BP (!) 146/80   Pulse 81   Temp 98 F (36.7 C) (Oral)   Ht 5' 8.25" (1.734 m)   Wt 208 lb 12.8 oz (94.7 kg)   SpO2 98%   BMI 31.52 kg/m    Objective:   Physical Exam  Constitutional: He is oriented to person, place, and time. He appears well-nourished.  Neck: Neck supple.  Cardiovascular: Normal rate and regular rhythm.   Pulmonary/Chest: Effort normal and breath sounds normal. He has no wheezes. He has no rales.  Neurological: He is alert and oriented to person, place, and time.  Skin: Skin is warm and dry.  Psychiatric: He has a normal mood and affect.          Assessment & Plan:

## 2016-06-03 NOTE — Progress Notes (Signed)
Pre visit review using our clinic review tool, if applicable. No additional management support is needed unless otherwise documented below in the visit note. 

## 2016-06-03 NOTE — Patient Instructions (Signed)
Complete lab work prior to leaving today. I will notify you of your results once received.   I sent refills of hydrochlorothiazide 25 mg and amlodipine 5 mg to your pharmacy. Continue to monitor your blood pressure and I'll call you in 2 weeks for your readings.  I will send refills of simvastatin cholesterol medication once I receive the results of your cholesterol panel.  We will be in touch soon!  It was a pleasure to meet you today! Please don't hesitate to call me with any questions. Welcome to [facility]!

## 2016-06-04 ENCOUNTER — Encounter: Payer: Self-pay | Admitting: *Deleted

## 2016-06-04 ENCOUNTER — Other Ambulatory Visit: Payer: Self-pay | Admitting: Primary Care

## 2016-06-04 DIAGNOSIS — R739 Hyperglycemia, unspecified: Secondary | ICD-10-CM

## 2016-06-04 DIAGNOSIS — E785 Hyperlipidemia, unspecified: Secondary | ICD-10-CM

## 2016-06-04 MED ORDER — SIMVASTATIN 40 MG PO TABS: 40.0000 mg | ORAL_TABLET | Freq: Every evening | ORAL | 3 refills | Status: DC

## 2016-06-06 ENCOUNTER — Other Ambulatory Visit (INDEPENDENT_AMBULATORY_CARE_PROVIDER_SITE_OTHER): Payer: Medicare Other

## 2016-06-06 DIAGNOSIS — R739 Hyperglycemia, unspecified: Secondary | ICD-10-CM | POA: Diagnosis not present

## 2016-06-06 LAB — HEMOGLOBIN A1C: Hgb A1c MFr Bld: 6 % (ref 4.6–6.5)

## 2016-06-17 ENCOUNTER — Telehealth: Payer: Self-pay | Admitting: Primary Care

## 2016-06-17 NOTE — Telephone Encounter (Signed)
-----   Message from Pleas Koch, NP sent at 06/03/2016  2:31 PM EDT ----- Regarding: BP Please check on patient's BP readings since we added back HCTZ and Amlodipine.

## 2016-06-17 NOTE — Telephone Encounter (Signed)
Message left for patient to return my call.  

## 2016-06-18 NOTE — Telephone Encounter (Signed)
Spoken to patient. He stated that his blood pressures has been in th 155s to 145s over 90s to 80s.

## 2016-06-18 NOTE — Telephone Encounter (Signed)
Please have him continue to monitor and schedule him for an office visit for BP check in 2 weeks.

## 2016-06-18 NOTE — Telephone Encounter (Signed)
Message left for patient to return my call.  

## 2016-06-20 ENCOUNTER — Encounter: Payer: Self-pay | Admitting: Primary Care

## 2016-06-20 NOTE — Telephone Encounter (Signed)
Send patient a message through MyChart. 

## 2016-06-21 NOTE — Telephone Encounter (Signed)
Spoken to patient and follow up on 07/02/2016

## 2016-07-02 ENCOUNTER — Encounter: Payer: Self-pay | Admitting: Primary Care

## 2016-07-02 ENCOUNTER — Ambulatory Visit (INDEPENDENT_AMBULATORY_CARE_PROVIDER_SITE_OTHER): Payer: Medicare Other | Admitting: Primary Care

## 2016-07-02 VITALS — BP 136/84 | HR 82 | Temp 98.1°F | Ht 68.0 in | Wt 199.0 lb

## 2016-07-02 DIAGNOSIS — I10 Essential (primary) hypertension: Secondary | ICD-10-CM | POA: Diagnosis not present

## 2016-07-02 MED ORDER — AMLODIPINE BESYLATE 10 MG PO TABS: 10.0000 mg | ORAL_TABLET | Freq: Every day | ORAL | 1 refills | Status: DC

## 2016-07-02 NOTE — Progress Notes (Signed)
Pre visit review using our clinic review tool, if applicable. No additional management support is needed unless otherwise documented below in the visit note. 

## 2016-07-02 NOTE — Progress Notes (Signed)
Subjective:    Patient ID: Jerry Wong, male    DOB: May 08, 1945, 71 y.o.   MRN: [id number]  HPI  [name] is a 71 year old male who presents today for follow up of hypertension. He is currently managed on Amlodipine 5 mg and HCTZ 25 mg. His BP in the office today is 136/84. He's been checking his BP at home and is getting readings of 145-155/80's-90's. He denies dizziness, chest pain, shortness of breath.   He's been working on improvements in his diet by reducing portion sizes and has cut back on sugary food. He has noticed mild ankle edema bilaterally which began several weeks ago, this is not bothersome.  Wt Readings from Last 3 Encounters:  07/02/16 199 lb (90.3 kg)  06/03/16 208 lb 12.8 oz (94.7 kg)  10/05/15 192 lb 12.8 oz (87.5 kg)     Review of Systems  Eyes: Negative for visual disturbance.  Respiratory: Negative for shortness of breath.   Cardiovascular: Negative for chest pain.       Ankle edema  Neurological: Negative for dizziness and headaches.       Past Medical History:  Diagnosis Date  . Arthritis   . Chickenpox   . Chronic headaches   . Chronic neck and back pain   . History of kidney stones   . Hyperlipidemia   . Hypertension   . Insomnia   . Prostate cancer [facility])      Social History   Social History  . Marital status: Married    Spouse name: Jerry Wong  . Number of children: 3  . Years of education: 12   Occupational History  . Not on file.   Social History Main Topics  . Smoking status: Former Research scientist (life sciences)  . Smokeless tobacco: Never Used  . Alcohol use No  . Drug use: No  . Sexual activity: Not on file   Other Topics Concern  . Not on file   Social History Narrative   Patient is married Jerry Wong) and lives at home with his wife and son.   Patient has three children.   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   Patient has a high school education.   Patient is right-handed.   Patient drinks one cup of coffee every morning.    Past  Surgical History:  Procedure Laterality Date  . BACK SURGERY  1974  . NECK SURGERY  2002  . PROSTATECTOMY    . TONSILLECTOMY  1958    Family History  Problem Relation Age of Onset  . Arthritis Mother   . Lung cancer Mother   . Arthritis Father   . Lung cancer Father   . Hyperlipidemia Father   . Hypertension Father   . Stroke Father   . Prostate cancer Brother     No Known Allergies  Current Outpatient Prescriptions on File Prior to Visit  Medication Sig Dispense Refill  . amitriptyline (ELAVIL) 75 MG tablet TAKE 1 TABLET (75 MG TOTAL) BY MOUTH AT BEDTIME. 90 tablet 3  . aspirin EC 81 MG tablet Take 81 mg by mouth daily.     . hydrochlorothiazide (HYDRODIURIL) 25 MG tablet Take 1 tablet (25 mg total) by mouth daily. 90 tablet 0  . LYRICA 75 MG capsule Take 75 mg by mouth 2 (two) times daily.     Marland Kitchen oxyCODONE-acetaminophen (PERCOCET) 10-325 MG per tablet Take 1 tablet by mouth as needed for pain.     . simvastatin (ZOCOR) 40 MG  tablet Take 1 tablet (40 mg total) by mouth every evening. 90 tablet 3  . tiZANidine (ZANAFLEX) 4 MG tablet TAKE 1 TABLET (4 MG TOTAL) BY MOUTH 2 (TWO) TIMES DAILY. 180 tablet 3  . zolpidem (AMBIEN) 10 MG tablet Take 10 mg by mouth at bedtime as needed.  4   No current facility-administered medications on file prior to visit.     BP 136/84 (BP Location: Left Arm, Patient Position: Sitting, Cuff Size: Normal)   Pulse 82   Temp 98.1 F (36.7 C) (Oral)   Ht '5\' 8"'$  (1.727 m)   Wt 199 lb (90.3 kg)   SpO2 97%   BMI 30.26 kg/m    Objective:   Physical Exam  Constitutional: He appears well-nourished.  Neck: Neck supple.  Cardiovascular: Normal rate and regular rhythm.   Mild ankle edema bilaterally  Pulmonary/Chest: Effort normal and breath sounds normal.  Skin: Skin is warm and dry.          Assessment & Plan:

## 2016-07-02 NOTE — Patient Instructions (Addendum)
We've increased the dose of your Amlodipine from 5 mg to 10 mg. You may take two of the 5 mg tablets until your bottle is empty. I sent a prescription for Amlodipine 10 mg to your pharmacy, take one of these tablets when you pick this up.  Complete lab work prior to leaving today. I will notify you of your results once received.   Monitor your blood pressure and notify me if you get readings consistently above 140/90.  Please schedule a physical with me in 6 months. You may also schedule a lab only appointment 3-4 days prior. We will discuss your lab results in detail during your physical.  It was a pleasure to see you today!

## 2016-07-02 NOTE — Assessment & Plan Note (Signed)
Improved on Amlodipine 5 mg and HCTZ 25 mg. Home readings slightly above goal. Would like to see him below 135/90. Will increase Amlodipine to 10 mg once daily, continue HCTZ. He will update if ankle edema becomes bothersome. BMP pending today.

## 2016-07-03 LAB — BASIC METABOLIC PANEL
BUN: 21 mg/dL (ref 6–23)
CO2: 28 mEq/L (ref 19–32)
Calcium: 10.1 mg/dL (ref 8.4–10.5)
Chloride: 105 mEq/L (ref 96–112)
Creatinine, Ser: 1.19 mg/dL (ref 0.40–1.50)
GFR: 64.47 mL/min (ref >60.00)
Glucose, Bld: 115 mg/dL — ABNORMAL HIGH (ref 70–99)
Potassium: 4.2 mEq/L (ref 3.5–5.1)
Sodium: 140 mEq/L (ref 135–145)

## 2016-08-12 ENCOUNTER — Other Ambulatory Visit: Payer: Self-pay | Admitting: Nurse Practitioner

## 2016-08-14 ENCOUNTER — Other Ambulatory Visit: Payer: Self-pay | Admitting: Primary Care

## 2016-08-14 DIAGNOSIS — I10 Essential (primary) hypertension: Secondary | ICD-10-CM

## 2016-08-29 ENCOUNTER — Other Ambulatory Visit: Payer: Self-pay | Admitting: Primary Care

## 2016-08-29 DIAGNOSIS — I10 Essential (primary) hypertension: Secondary | ICD-10-CM

## 2016-10-07 ENCOUNTER — Ambulatory Visit: Payer: Medicare Other | Admitting: Nurse Practitioner

## 2016-10-15 NOTE — Progress Notes (Signed)
[facility]  PATIENT: Jerry Wong DOB: [birth year]   REASON FOR VISIT: follow-up for history of headaches HISTORY FROM:Patient    HISTORY OF PRESENT ILLNESS:Jerry Wong, 71 year old male returns for yearly  follow-up.He has a history of long-standing headaches which have responded well to amitriptyline and Tizanidine. He has attempted to wean himself off his medications and severe headaches would return.Has history of neck surgery and back surgery in the past. He is again complaining of lower back pain and sees a pain specialist however he is going to contact his primary care for referral to orthopedics. He has a history of high blood pressure and gout- both controlled on medication. He has not taken his blood pressure medicine today when it is elevated in the office, 152/87. He returns for reevaluation. He has no new neurologic complaints.   REVIEW OF SYSTEMS: Full 14 system review of systems performed and notable only for those listed, all others are neg:  Constitutional: neg  Cardiovascular: neg Ear/Nose/Throat: neg  Skin: neg Eyes: neg Respiratory: neg Gastroitestinal: neg  Hematology/Lymphatic: neg  Endocrine: neg Musculoskeletal:back pain, neck stiffness Allergy/Immunology: neg Neurological: Weakness, numbness Psychiatric: neg Sleep : neg   ALLERGIES: No Known Allergies  HOME MEDICATIONS: Outpatient Medications Prior to Visit  Medication Sig Dispense Refill  . amitriptyline (ELAVIL) 75 MG tablet TAKE 1 TABLET BY MOUTH AT BEDTIME 90 tablet 3  . amLODipine (NORVASC) 10 MG tablet Take 1 tablet (10 mg total) by mouth daily. 90 tablet 1  . aspirin EC 81 MG tablet Take 81 mg by mouth daily.     . hydrochlorothiazide (HYDRODIURIL) 25 MG tablet TAKE 1 TABLET BY MOUTH EVERY DAY 90 tablet 0  . LYRICA 75 MG capsule Take 75 mg by mouth 2 (two) times daily.     Marland Kitchen oxyCODONE-acetaminophen (PERCOCET) 10-325 MG per tablet Take 1 tablet by mouth as needed for pain.      . simvastatin (ZOCOR) 40 MG tablet Take 1 tablet (40 mg total) by mouth every evening. 90 tablet 3  . tiZANidine (ZANAFLEX) 4 MG tablet TAKE 1 TABLET (4 MG TOTAL) BY MOUTH 2 (TWO) TIMES DAILY. 180 tablet 3  . zolpidem (AMBIEN) 10 MG tablet Take 10 mg by mouth at bedtime as needed.  4   No facility-administered medications prior to visit.     PAST MEDICAL HISTORY: Past Medical History:  Diagnosis Date  . Arthritis   . Chickenpox   . Chronic headaches   . Chronic neck and back pain   . History of kidney stones   . Hyperlipidemia   . Hypertension   . Insomnia   . Prostate cancer ([place])     PAST SURGICAL HISTORY: Past Surgical History:  Procedure Laterality Date  . BACK SURGERY  1974  . NECK SURGERY  2002  . PROSTATECTOMY    . TONSILLECTOMY  1958    FAMILY HISTORY: Family History  Problem Relation Age of Onset  . Arthritis Mother   . Lung cancer Mother   . Arthritis Father   . Lung cancer Father   . Hyperlipidemia Father   . Hypertension Father   . Stroke Father   . Prostate cancer Brother     SOCIAL HISTORY: Social History   Social History  . Marital status: Married    Spouse name: Jerry Wong  . Number of children: 3  . Years of education: 12   Occupational History  . Not on file.   Social History Main Topics  . Smoking status: Former Research scientist (life sciences)  .  Smokeless tobacco: Never Used  . Alcohol use No  . Drug use: No  . Sexual activity: Not on file   Other Topics Concern  . Not on file   Social History Narrative   Patient is married Jerry Wong) and lives at home with his wife and son.   Patient has three children.   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   Patient has a high school education.   Patient is right-handed.   Patient drinks one cup of coffee every morning.     PHYSICAL EXAM  Vitals:   10/16/16 1442  BP: (!) 152/87  Pulse: 97  Weight: 196 lb 12.8 oz (89.3 kg)  Height: 5\' 8"  (1.727 m)   Body mass index is 29.92 kg/m. General: well  developed, well nourished, seated, in no evident distress Head: head normocephalic and atraumatic. Oropharynx benign Neck: supple with no carotid bruits,  Neurologic Exam Mental Status: Awake and fully alert. Oriented to place and time. Follows all commands. Speech and language normal.  Cranial Nerves: . Pupils equal, briskly reactive to light. Extraocular movements full without nystagmus. Visual fields full to confrontation.No ptosis Hearing intact and symmetric to finger snap. Facial sensation intact. Face, tongue, palate move normally and symmetrically. Neck flexion and extension normal.  Motor: Normal bulk and tone. Normal strength in all tested extremity muscles.No focal weakness Coordination: Rapid alternating movements normal in all extremities. Finger-to-nose and heel-to-shin performed accurately bilaterally. Gait and Station: Arises from chair without difficulty. Stance is normal. Gait demonstrates normal stride length and balance .  Reflexes: 1+ and symmetric. Toes downgoing.     DIAGNOSTIC DATA (LABS, IMAGING, TESTING) - ASSESSMENT AND PLAN  71 y.o. year old male  has a past medical history of Arthritis; Chickenpox; Chronic headaches; Chronic neck and back pain; History of kidney stones; Hyperlipidemia; Hypertension; Insomnia; and Prostate cancer ([place]). here to follow-up. His headaches are well controlled.   PLAN: Continue amitriptyline daily will refill Continue tizanidine  twice daily will refill Follow-up yearly and when necessary Call for worsening headaches Jerry Bible, [facility], [facility], APRN  [facility] [address], [place], [place] [address] [phone]

## 2016-10-16 ENCOUNTER — Ambulatory Visit (INDEPENDENT_AMBULATORY_CARE_PROVIDER_SITE_OTHER): Payer: Medicare Other | Admitting: Nurse Practitioner

## 2016-10-16 ENCOUNTER — Encounter: Payer: Self-pay | Admitting: Nurse Practitioner

## 2016-10-16 VITALS — BP 152/87 | HR 97 | Ht 68.0 in | Wt 196.8 lb

## 2016-10-16 DIAGNOSIS — R51 Headache: Secondary | ICD-10-CM | POA: Diagnosis not present

## 2016-10-16 DIAGNOSIS — R519 Headache, unspecified: Secondary | ICD-10-CM

## 2016-10-16 MED ORDER — AMITRIPTYLINE HCL 75 MG PO TABS: 75.0000 mg | ORAL_TABLET | Freq: Every day | ORAL | 3 refills | Status: DC

## 2016-10-16 MED ORDER — TIZANIDINE HCL 4 MG PO TABS: ORAL_TABLET | ORAL | 3 refills | Status: DC

## 2016-10-16 NOTE — Patient Instructions (Signed)
Continue amitriptyline daily will refill Continue tizanidine  twice daily will refill Follow-up yearly and when necessary Call for worsening headaches

## 2016-10-19 NOTE — Progress Notes (Signed)
I agree with the assessment and plan as directed by NP .The patient is known to me .   Jennine Peddy, MD  

## 2016-11-10 ENCOUNTER — Other Ambulatory Visit: Payer: Self-pay | Admitting: Primary Care

## 2016-11-10 DIAGNOSIS — I10 Essential (primary) hypertension: Secondary | ICD-10-CM

## 2016-12-02 ENCOUNTER — Other Ambulatory Visit: Payer: Self-pay | Admitting: Primary Care

## 2016-12-02 DIAGNOSIS — E785 Hyperlipidemia, unspecified: Secondary | ICD-10-CM

## 2016-12-02 DIAGNOSIS — I10 Essential (primary) hypertension: Secondary | ICD-10-CM

## 2016-12-02 DIAGNOSIS — R7303 Prediabetes: Secondary | ICD-10-CM

## 2016-12-02 DIAGNOSIS — R972 Elevated prostate specific antigen [PSA]: Secondary | ICD-10-CM

## 2016-12-02 DIAGNOSIS — Z1159 Encounter for screening for other viral diseases: Secondary | ICD-10-CM

## 2016-12-06 ENCOUNTER — Ambulatory Visit (INDEPENDENT_AMBULATORY_CARE_PROVIDER_SITE_OTHER): Payer: Medicare Other

## 2016-12-06 ENCOUNTER — Other Ambulatory Visit: Payer: Medicare Other

## 2016-12-06 VITALS — BP 130/82 | HR 99 | Temp 97.8°F | Ht 68.5 in | Wt 193.5 lb

## 2016-12-06 DIAGNOSIS — R7303 Prediabetes: Secondary | ICD-10-CM

## 2016-12-06 DIAGNOSIS — Z23 Encounter for immunization: Secondary | ICD-10-CM | POA: Diagnosis not present

## 2016-12-06 DIAGNOSIS — Z1159 Encounter for screening for other viral diseases: Secondary | ICD-10-CM | POA: Diagnosis not present

## 2016-12-06 DIAGNOSIS — I10 Essential (primary) hypertension: Secondary | ICD-10-CM | POA: Diagnosis not present

## 2016-12-06 DIAGNOSIS — Z0000 Encounter for general adult medical examination without abnormal findings: Secondary | ICD-10-CM | POA: Diagnosis not present

## 2016-12-06 DIAGNOSIS — E785 Hyperlipidemia, unspecified: Secondary | ICD-10-CM | POA: Diagnosis not present

## 2016-12-06 DIAGNOSIS — Z125 Encounter for screening for malignant neoplasm of prostate: Secondary | ICD-10-CM

## 2016-12-06 LAB — PSA: PSA: 11.67 ng/mL — ABNORMAL HIGH (ref 0.10–4.00)

## 2016-12-06 NOTE — Progress Notes (Signed)
Pre visit review using our clinic review tool, if applicable. No additional management support is needed unless otherwise documented below in the visit note. 

## 2016-12-06 NOTE — Progress Notes (Signed)
I reviewed health advisor's note, was available for consultation, and agree with documentation and plan.  

## 2016-12-06 NOTE — Patient Instructions (Signed)
Jerry Wong , Thank you for taking time to come for your Medicare Wellness Visit. I appreciate your ongoing commitment to your health goals. Please review the following plan we discussed and let me know if I can assist you in the future.   These are the goals we discussed: Goals    . Increase water intake          Starting 12/06/2016, I will continue to drink at least 8 hours of water daily.        This is a list of the screening recommended for you and due dates:  Health Maintenance  Topic Date Due  . Colon Cancer Screening  06/09/2026*  . Tetanus Vaccine  02/08/2017  . Flu Shot  Completed  .  Hepatitis C: One time screening is recommended by Center for Disease Control  (CDC) for  adults born from [birth year] through 1963.   Completed  . Pneumonia vaccines  Completed  *Topic was postponed. The date shown is not the original due date.   Preventive Care for Adults  A healthy lifestyle and preventive care can promote health and wellness. Preventive health guidelines for adults include the following key practices.  . A routine yearly physical is a good way to check with your health care provider about your health and preventive screening. It is a chance to share any concerns and updates on your health and to receive a thorough exam.  . Visit your dentist for a routine exam and preventive care every 6 months. Brush your teeth twice a day and floss once a day. Good oral hygiene prevents tooth decay and gum disease.  . The frequency of eye exams is based on your age, health, family medical history, use  of contact lenses, and other factors. Follow your health care provider's ecommendations for frequency of eye exams.  . Eat a healthy diet. Foods like vegetables, fruits, whole grains, low-fat dairy products, and lean protein foods contain the nutrients you need without too many calories. Decrease your intake of foods high in solid fats, added sugars, and salt. Eat the right amount of calories for  you. Get information about a proper diet from your health care provider, if necessary.  . Regular physical exercise is one of the most important things you can do for your health. Most adults should get at least 150 minutes of moderate-intensity exercise (any activity that increases your heart rate and causes you to sweat) each week. In addition, most adults need muscle-strengthening exercises on 2 or more days a week.  Silver Sneakers may be a benefit available to you. To determine eligibility, you may visit the website: www.silversneakers.com or contact program at 478-585-4302 Mon-Fri between 8AM-8PM.   . Maintain a healthy weight. The body mass index (BMI) is a screening tool to identify possible weight problems. It provides an estimate of body fat based on height and weight. Your health care provider can find your BMI and can help you achieve or maintain a healthy weight.   For adults 20 years and older: ? A BMI below 18.5 is considered underweight. ? A BMI of 18.5 to 24.9 is normal. ? A BMI of 25 to 29.9 is considered overweight. ? A BMI of 30 and above is considered obese.   . Maintain normal blood lipids and cholesterol levels by exercising and minimizing your intake of saturated fat. Eat a balanced diet with plenty of fruit and vegetables. Blood tests for lipids and cholesterol should begin at age 80 and be  repeated every 5 years. If your lipid or cholesterol levels are high, you are over 50, or you are at high risk for heart disease, you may need your cholesterol levels checked more frequently. Ongoing high lipid and cholesterol levels should be treated with medicines if diet and exercise are not working.  . If you smoke, find out from your health care provider how to quit. If you do not use tobacco, please do not start.  . If you choose to drink alcohol, please do not consume more than 2 drinks per day. One drink is considered to be 12 ounces (355 mL) of beer, 5 ounces (148 mL) of  wine, or 1.5 ounces (44 mL) of liquor.  . If you are 39-37 years old, ask your health care provider if you should take aspirin to prevent strokes.  . Use sunscreen. Apply sunscreen liberally and repeatedly throughout the day. You should seek shade when your shadow is shorter than you. Protect yourself by wearing long sleeves, pants, a wide-brimmed hat, and sunglasses year round, whenever you are outdoors.  . Once a month, do a whole body skin exam, using a mirror to look at the skin on your back. Tell your health care provider of new moles, moles that have irregular borders, moles that are larger than a pencil eraser, or moles that have changed in shape or color.

## 2016-12-06 NOTE — Progress Notes (Signed)
PCP notes:   Health maintenance:  Colonoscopy - PCP please address at next appt Hep C screening - complete Flu vaccine - administered PPSV23 - administered  Abnormal screenings:   Mini-Cog score: 18/20 Fall risk - multiple falls without injury Hearing- failed  Hearing Screening   125Hz  250Hz  500Hz  1000Hz  2000Hz  3000Hz  4000Hz  6000Hz  8000Hz   Right ear:   40 40 40  40    Left ear:   0 40 40  0     Patient concerns:   None  Nurse concerns:  PCP please consider patient for referral to Balance and Vestibular Rehabilitation program at [facility] due to falls related to numbness in feet  Next PCP appt:   12/10/16 @ 1445

## 2016-12-06 NOTE — Progress Notes (Signed)
Subjective:   Jerry Wong is a 71 y.o. male who presents for an Initial Medicare Annual Wellness Visit.  Review of Systems  N/A  Cardiac Risk Factors include: advanced age (>17men, >67 women);male gender;dyslipidemia;hypertension    Objective:    Today's Vitals   12/06/16 1519  BP: 130/82  Pulse: 99  Temp: 97.8 F (36.6 C)  TempSrc: Oral  SpO2: 98%  Weight: 193 lb 8 oz (87.8 kg)  Height: 5' 8.5" (1.74 m)  PainSc: 0-No pain   Body mass index is 28.99 kg/m.  Current Medications (verified) Outpatient Encounter Prescriptions as of 12/06/2016  Medication Sig  . amitriptyline (ELAVIL) 75 MG tablet Take 1 tablet (75 mg total) by mouth at bedtime.  Marland Kitchen amLODipine (NORVASC) 10 MG tablet Take 1 tablet (10 mg total) by mouth daily.  Marland Kitchen aspirin EC 81 MG tablet Take 81 mg by mouth daily.   . hydrochlorothiazide (HYDRODIURIL) 25 MG tablet TAKE 1 TABLET BY MOUTH EVERY DAY  . LYRICA 75 MG capsule Take 75 mg by mouth 2 (two) times daily.   Marland Kitchen oxyCODONE-acetaminophen (PERCOCET) 10-325 MG per tablet Take 1 tablet by mouth as needed for pain.   . simvastatin (ZOCOR) 40 MG tablet Take 1 tablet (40 mg total) by mouth every evening.  Marland Kitchen tiZANidine (ZANAFLEX) 4 MG tablet TAKE 1 TABLET (4 MG TOTAL) BY MOUTH 2 (TWO) TIMES DAILY.  Marland Kitchen zolpidem (AMBIEN) 10 MG tablet Take 10 mg by mouth at bedtime as needed.   No facility-administered encounter medications on file as of 12/06/2016.     Allergies (verified) Patient has no known allergies.   History: Past Medical History:  Diagnosis Date  . Arthritis   . Chickenpox   . Chronic headaches   . Chronic neck and back pain   . History of kidney stones   . Hyperlipidemia   . Hypertension   . Insomnia   . Prostate cancer [facility])    Past Surgical History:  Procedure Laterality Date  . BACK SURGERY  1974  . NECK SURGERY  2002  . PROSTATECTOMY    . TONSILLECTOMY  1958   Family History  Problem Relation Age of Onset  . Arthritis Mother   . Lung  cancer Mother   . Arthritis Father   . Lung cancer Father   . Hyperlipidemia Father   . Hypertension Father   . Stroke Father   . Prostate cancer Brother    Social History   Occupational History  . Not on file.   Social History Main Topics  . Smoking status: Former Research scientist (life sciences)  . Smokeless tobacco: Never Used  . Alcohol use No  . Drug use: No  . Sexual activity: Not on file   Tobacco Counseling Counseling given: No   Activities of Daily Living In your present state of health, do you have any difficulty performing the following activities: 12/06/2016  Hearing? N  Vision? N  Difficulty concentrating or making decisions? N  Walking or climbing stairs? N  Dressing or bathing? N  Doing errands, shopping? N  Preparing Food and eating ? N  Using the Toilet? N  In the past six months, have you accidently leaked urine? N  Do you have problems with loss of bowel control? N  Managing your Medications? N  Managing your Finances? N  Housekeeping or managing your Housekeeping? N  Some recent data might be hidden    Immunizations and Health Maintenance Immunization History  Administered Date(s) Administered  . Influenza, High Dose Seasonal  PF 12/06/2016  . Pneumococcal Conjugate-13 02/28/2014  . Pneumococcal Polysaccharide-23 12/06/2016  . Tdap 02/09/2007   There are no preventive care reminders to display for this patient.  Patient Care Team: Pleas Koch, NP as PCP - General (Internal Medicine)  Indicate any recent Medical Services you may have received from other than [facility] providers in the past year (date may be approximate).    Assessment:   This is a routine wellness examination for Jerry Wong.   Hearing/Vision screen  Hearing Screening   125Hz  250Hz  500Hz  1000Hz  2000Hz  3000Hz  4000Hz  6000Hz  8000Hz   Right ear:   40 40 40  40    Left ear:   0 40 40  0    Vision Screening Comments: Last vision exam in Oct 2017  Dietary issues and exercise activities  discussed: Current Exercise Habits: The patient does not participate in regular exercise at present (yard work daily 1-8 hours ), Exercise limited by: None identified  Goals    . Increase water intake          Starting 12/06/2016, I will continue to drink at least 8 hours of water daily.       Depression Screen PHQ 2/9 Scores 12/06/2016  PHQ - 2 Score 0  PHQ- 9 Score 0    Fall Risk Fall Risk  12/06/2016  Falls in the past year? Yes  Number falls in past yr: 2 or more  Injury with Fall? No  Risk Factor Category  High Fall Risk  Risk for fall due to : Other (Comment)  Risk for fall due to: Comment numbness in feet    Cognitive Function: MMSE - Mini Mental State Exam 12/06/2016  Orientation to time 5  Orientation to Place 5  Registration 3  Attention/ Calculation 0  Recall 2  Recall-comments unable to recall 1 of 3 words  Language- name 2 objects 0  Language- repeat 1  Language- follow 3 step command 2  Language- follow 3 step command-comments unable to complete 1 step of 3 step command  Language- read & follow direction 0  Write a sentence 0  Copy design 0  Total score 18     PLEASE NOTE: A Mini-Cog screen was completed. Maximum score is 20. A value of 0 denotes this part of [place]stein MMSE was not completed or the patient failed this part of the Mini-Cog screening.   Mini-Cog Screening Orientation to Time - Max 5 pts Orientation to Place - Max 5 pts Registration - Max 3 pts Recall - Max 3 pts Language Repeat - Max 1 pts Language Follow 3 Step Command - Max 3 pts     Screening Tests Health Maintenance  Topic Date Due  . COLONOSCOPY  06/09/2026 (Originally 06/09/2016)  . TETANUS/TDAP  02/08/2017  . INFLUENZA VACCINE  Completed  . Hepatitis C Screening  Completed  . PNA vac Low Risk Adult  Completed        Plan:     I have personally reviewed and addressed the Medicare Annual Wellness questionnaire and have noted the following in the patient's chart:   A. Medical and social history B. Use of alcohol, tobacco or illicit drugs  C. Current medications and supplements D. Functional ability and status E.  Nutritional status F.  Physical activity G. Advance directives H. List of other physicians I.  Hospitalizations, surgeries, and ER visits in previous 12 months J.  [place] to include hearing, vision, cognitive, depression L. Referrals and appointments - none  In  addition, I have reviewed and discussed with patient certain preventive protocols, quality metrics, and best practice recommendations. A written personalized care plan for preventive services as well as general preventive health recommendations were provided to patient.  See attached scanned questionnaire for additional information.   Signed,   Lindell Noe, MHA, BS, LPN Health Coach

## 2016-12-07 LAB — COMPREHENSIVE METABOLIC PANEL
AG Ratio: 1.6 (calc) (ref 1.0–2.5)
ALT: 13 U/L (ref 9–46)
AST: 17 U/L (ref 10–35)
Albumin: 4.5 g/dL (ref 3.6–5.1)
Alkaline phosphatase (APISO): 60 U/L (ref 40–115)
BUN: 22 mg/dL (ref 7–25)
CO2: 24 mmol/L (ref 20–32)
Calcium: 9.9 mg/dL (ref 8.6–10.3)
Chloride: 101 mmol/L (ref 98–110)
Creat: 1.22 mg/dL (ref 0.70–1.25)
Globulin: 2.8 g/dL (calc) (ref 1.9–3.7)
Glucose, Bld: 117 mg/dL — ABNORMAL HIGH (ref 65–99)
Potassium: 3.8 mmol/L (ref 3.5–5.3)
Sodium: 138 mmol/L (ref 135–146)
Total Bilirubin: 0.4 mg/dL (ref 0.2–1.2)
Total Protein: 7.3 g/dL (ref 6.1–8.1)

## 2016-12-07 LAB — HEMOGLOBIN A1C
Hgb A1c MFr Bld: 5.8 % of total Hgb — ABNORMAL HIGH (ref <5.7)
Mean Plasma Glucose: 120 (calc)
eAG (mmol/L): 6.6 (calc)

## 2016-12-07 LAB — HEPATITIS C ANTIBODY
Hepatitis C Ab: NONREACTIVE
SIGNAL TO CUT-OFF: 0.38 (ref <1.00)

## 2016-12-07 LAB — LIPID PANEL
Cholesterol: 140 mg/dL (ref <200)
HDL: 40 mg/dL — ABNORMAL LOW (ref >40)
LDL Cholesterol (Calc): 66 mg/dL (calc)
Non-HDL Cholesterol (Calc): 100 mg/dL (calc) (ref <130)
Total CHOL/HDL Ratio: 3.5 (calc) (ref <5.0)
Triglycerides: 257 mg/dL — ABNORMAL HIGH (ref <150)

## 2016-12-10 ENCOUNTER — Ambulatory Visit (INDEPENDENT_AMBULATORY_CARE_PROVIDER_SITE_OTHER): Payer: Medicare Other | Admitting: Primary Care

## 2016-12-10 VITALS — BP 132/70 | HR 97 | Temp 98.0°F | Ht 68.5 in | Wt 193.8 lb

## 2016-12-10 DIAGNOSIS — E785 Hyperlipidemia, unspecified: Secondary | ICD-10-CM | POA: Diagnosis not present

## 2016-12-10 DIAGNOSIS — G8929 Other chronic pain: Secondary | ICD-10-CM | POA: Diagnosis not present

## 2016-12-10 DIAGNOSIS — Z23 Encounter for immunization: Secondary | ICD-10-CM

## 2016-12-10 DIAGNOSIS — R51 Headache: Secondary | ICD-10-CM

## 2016-12-10 DIAGNOSIS — I10 Essential (primary) hypertension: Secondary | ICD-10-CM

## 2016-12-10 DIAGNOSIS — C61 Malignant neoplasm of prostate: Secondary | ICD-10-CM

## 2016-12-10 DIAGNOSIS — M549 Dorsalgia, unspecified: Secondary | ICD-10-CM

## 2016-12-10 DIAGNOSIS — Z0000 Encounter for general adult medical examination without abnormal findings: Secondary | ICD-10-CM

## 2016-12-10 DIAGNOSIS — M542 Cervicalgia: Secondary | ICD-10-CM | POA: Diagnosis not present

## 2016-12-10 DIAGNOSIS — R519 Headache, unspecified: Secondary | ICD-10-CM

## 2016-12-10 DIAGNOSIS — Z1211 Encounter for screening for malignant neoplasm of colon: Secondary | ICD-10-CM

## 2016-12-10 DIAGNOSIS — R972 Elevated prostate specific antigen [PSA]: Secondary | ICD-10-CM

## 2016-12-10 DIAGNOSIS — R7303 Prediabetes: Secondary | ICD-10-CM

## 2016-12-10 MED ORDER — FISH OIL 1000 MG PO CAPS: ORAL_CAPSULE | ORAL | 3 refills | Status: DC

## 2016-12-10 MED ORDER — ZOSTER VAC RECOMB ADJUVANTED 50 MCG/0.5ML IM SUSR: 0.5000 mL | Freq: Once | INTRAMUSCULAR | 1 refills | Status: AC

## 2016-12-10 NOTE — Assessment & Plan Note (Signed)
Previously following with Urology, no recent follow up in the last 2-3 years. PSA in April 2018 of 10.82 and 11.67 in October 2018. No urinary symptoms. Recommended he contact his Urologist, he agrees and will do this.

## 2016-12-10 NOTE — Assessment & Plan Note (Addendum)
Doing well on Percocet, Lyrica, and Tizanidine following with Dr. Hardin Negus through pain management. Also getting Ambien from pain management, using 3-4 nights weekly.

## 2016-12-10 NOTE — Assessment & Plan Note (Signed)
Trigs of 257, TC and LDL stable. Continue Simvastatin 40 mg. Will start Fish Oil 1000 mg once daily with meals. Discussed to increase with vegetables, fruit, whole grains.

## 2016-12-10 NOTE — Patient Instructions (Signed)
Take the Shingles vaccination to the pharmacy for administration. Repeat the vaccination within 2-6 months after the first dose.  Start Fish Oil once daily with meals.   You will be contacted regarding your referral to GI for the colonoscopy.  Please let us know if you have not heard back within one week.   Start exercising. You should be getting 150 minutes of moderate intensity exercise weekly.  Please call the Urologist regarding your PSA levels.  Follow up in 1 year for your annual exam or sooner if needed.  It was a pleasure to see you today!    Food Choices to Lower Your Triglycerides Triglycerides are a type of fat in your blood. High levels of triglycerides can increase the risk of heart disease and stroke. If your triglyceride levels are high, the foods you eat and your eating habits are very important. Choosing the right foods can help lower your triglycerides. What general guidelines do I need to follow?  Lose weight if you are overweight.  Limit or avoid alcohol.  Fill one half of your plate with vegetables and green salads.  Limit fruit to two servings a day. Choose fruit instead of juice.  Make one fourth of your plate whole grains. Look for the word "whole" as the first word in the ingredient list.  Fill one fourth of your plate with lean protein foods.  Enjoy fatty fish (such as salmon, mackerel, sardines, and tuna) three times a week.  Choose healthy fats.  Limit foods high in starch and sugar.  Eat more home-cooked food and less restaurant, buffet, and fast food.  Limit fried foods.  Cook foods using methods other than frying.  Limit saturated fats.  Check ingredient lists to avoid foods with partially hydrogenated oils (trans fats) in them. What foods can I eat? Grains Whole grains, such as whole wheat or whole grain breads, crackers, cereals, and pasta. Unsweetened oatmeal, bulgur, barley, quinoa, or brown rice. Corn or whole wheat flour  tortillas. Vegetables Fresh or frozen vegetables (raw, steamed, roasted, or grilled). Green salads. Fruits All fresh, canned (in natural juice), or frozen fruits. Meat and Other Protein Products Ground beef (85% or leaner), grass-fed beef, or beef trimmed of fat. Skinless chicken or [place]. Ground chicken or [place]. Pork trimmed of fat. All fish and seafood. Eggs. Dried beans, peas, or lentils. Unsalted nuts or seeds. Unsalted canned or dry beans. Dairy Low-fat dairy products, such as skim or 1% milk, 2% or reduced-fat cheeses, low-fat ricotta or cottage cheese, or plain low-fat yogurt. Fats and Oils Tub margarines without trans fats. Light or reduced-fat mayonnaise and salad dressings. Avocado. Safflower, olive, or canola oils. Natural peanut or almond butter. The items listed above may not be a complete list of recommended foods or beverages. Contact your dietitian for more options. What foods are not recommended? Grains White bread. White pasta. White rice. Cornbread. Bagels, pastries, and croissants. Crackers that contain trans fat. Vegetables White potatoes. Corn. Creamed or fried vegetables. Vegetables in a cheese sauce. Fruits Dried fruits. Canned fruit in light or heavy syrup. Fruit juice. Meat and Other Protein Products Fatty cuts of meat. Ribs, chicken wings, bacon, sausage, bologna, salami, chitterlings, fatback, hot dogs, bratwurst, and packaged luncheon meats. Dairy Whole or 2% milk, cream, half-and-half, and cream cheese. Whole-fat or sweetened yogurt. Full-fat cheeses. Nondairy creamers and whipped toppings. Processed cheese, cheese spreads, or cheese curds. Sweets and Desserts Corn syrup, sugars, honey, and molasses. Candy. Jam and jelly. Syrup. Sweetened cereals. Cookies, pies,  cakes, donuts, muffins, and ice cream. Fats and Oils Butter, stick margarine, lard, shortening, ghee, or bacon fat. Coconut, palm kernel, or palm oils. Beverages Alcohol. Sweetened drinks (such as  sodas, lemonade, and fruit drinks or punches). The items listed above may not be a complete list of foods and beverages to avoid. Contact your dietitian for more information. This information is not intended to replace advice given to you by your health care provider. Make sure you discuss any questions you have with your health care provider. Document Released: 11/30/2003 Document Revised: 07/20/2015 Document Reviewed: 12/16/2012 Elsevier Interactive Patient Education  2017 [employer].

## 2016-12-10 NOTE — Assessment & Plan Note (Signed)
Recent A1C of 5.8. History of prediabetes for 10 years. Stable. Continue monitoring.

## 2016-12-10 NOTE — Assessment & Plan Note (Addendum)
Doing well on Tizanidine and amitriptyline. Following with neurology annually.

## 2016-12-10 NOTE — Progress Notes (Signed)
Subjective:    Patient ID: Jerry Wong, male    DOB: [birth year], 71 y.o.   MRN: [id number]  HPI  [name] is a 71 year old male who presents today for complete physical.  Immunizations: -Tetanus: Completed in 2008 -Influenza: Completed this season -Pneumonia: Completed Prevnar in 2016, Pneumovax in 2018 -Shingles: Due.   Diet: He endorses a fair diet. Breakfast: Skips Lunch: Skips Dinner: Sandwich Snacks: Celery with cream cheese, peanuts Desserts: Occasionally  Beverages: Water, diet Coke, little alcohol consumption  Exercise: He is active but exercise. Eye exam: Completed in 2017 Dental exam: Has not completed recently Colonoscopy: Completed 10 years ago. Due. PSA: 11.67 Hep C Screen: Negative    Review of Systems  Constitutional: Negative for unexpected weight change.  HENT: Negative for rhinorrhea.   Respiratory: Negative for cough and shortness of breath.   Cardiovascular: Negative for chest pain.  Gastrointestinal: Negative for constipation and diarrhea.  Genitourinary: Negative for difficulty urinating.  Musculoskeletal: Negative for arthralgias and myalgias.  Skin: Negative for rash.  Allergic/Immunologic: Negative for environmental allergies.  Neurological: Negative for dizziness, numbness and headaches.  Psychiatric/Behavioral:       Doing well on PRN Ambien.       Past Medical History:  Diagnosis Date  . Arthritis   . Chickenpox   . Chronic headaches   . Chronic neck and back pain   . History of kidney stones   . Hyperlipidemia   . Hypertension   . Insomnia   . Prostate cancer Jerry Wong)      Social History   Social History  . Marital status: Married    Spouse name: Jerry Wong  . Number of children: 3  . Years of education: 12   Occupational History  . Not on file.   Social History Main Topics  . Smoking status: Former Research scientist (life sciences)  . Smokeless tobacco: Never Used  . Alcohol use No  . Drug use: No  . Sexual activity: Not on file   Other Topics  Concern  . Not on file   Social History Narrative   Patient is married Jerry Wong) and lives at home with his wife and son.   Patient has three children.   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   Patient has a high school education.   Patient is right-handed.   Patient drinks one cup of coffee every morning.    Past Surgical History:  Procedure Laterality Date  . BACK SURGERY  1974  . NECK SURGERY  2002  . PROSTATECTOMY    . TONSILLECTOMY  1958    Family History  Problem Relation Age of Onset  . Arthritis Mother   . Lung cancer Mother   . Arthritis Father   . Lung cancer Father   . Hyperlipidemia Father   . Hypertension Father   . Stroke Father   . Prostate cancer Brother     No Known Allergies  Current Outpatient Prescriptions on File Prior to Visit  Medication Sig Dispense Refill  . amitriptyline (ELAVIL) 75 MG tablet Take 1 tablet (75 mg total) by mouth at bedtime. 90 tablet 3  . amLODipine (NORVASC) 10 MG tablet Take 1 tablet (10 mg total) by mouth daily. 90 tablet 1  . aspirin EC 81 MG tablet Take 81 mg by mouth daily.     . hydrochlorothiazide (HYDRODIURIL) 25 MG tablet TAKE 1 TABLET BY MOUTH EVERY DAY 90 tablet 1  . oxyCODONE-acetaminophen (PERCOCET) 10-325 MG per tablet Take 1 tablet  by mouth as needed for pain.     . simvastatin (ZOCOR) 40 MG tablet Take 1 tablet (40 mg total) by mouth every evening. 90 tablet 3  . tiZANidine (ZANAFLEX) 4 MG tablet TAKE 1 TABLET (4 MG TOTAL) BY MOUTH 2 (TWO) TIMES DAILY. 180 tablet 3  . zolpidem (AMBIEN) 10 MG tablet Take 10 mg by mouth at bedtime as needed.  4   No current facility-administered medications on file prior to visit.     BP 132/70   Pulse 97   Temp 98 F (36.7 C) (Oral)   Ht 5' 8.5" (1.74 m)   Wt 193 lb 12.8 oz (87.9 kg)   SpO2 98%   BMI 29.04 kg/m    Objective:   Physical Exam  Constitutional: He is oriented to person, place, and time. He appears well-nourished.  HENT:  Right Ear:  Tympanic membrane and ear canal normal.  Left Ear: Tympanic membrane and ear canal normal.  Nose: Nose normal. Right sinus exhibits no maxillary sinus tenderness and no frontal sinus tenderness. Left sinus exhibits no maxillary sinus tenderness and no frontal sinus tenderness.  Mouth/Throat: Oropharynx is clear and moist.  Eyes: Pupils are equal, round, and reactive to light. Conjunctivae and EOM are normal.  Neck: Neck supple. Carotid bruit is not present. No thyromegaly present.  Cardiovascular: Normal rate, regular rhythm and normal heart sounds.   Pulmonary/Chest: Effort normal and breath sounds normal. He has no wheezes. He has no rales.  Abdominal: Soft. Bowel sounds are normal. There is no tenderness.  Musculoskeletal: Normal range of motion.  Neurological: He is alert and oriented to person, place, and time. He has normal reflexes. No cranial nerve deficit.  Skin: Skin is warm and dry.  Psychiatric: He has a normal mood and affect.          Assessment & Plan:

## 2016-12-10 NOTE — Assessment & Plan Note (Signed)
Stable in the office today, continue Amlodipine and HCTZ. BMP unremarkable.

## 2016-12-10 NOTE — Assessment & Plan Note (Signed)
Immunizations UTD. Shingrix Rx provided today. PSA elevated when compared to reading in April 2018. History of prostate cancer. Referral placed to Urology. Colonoscopy due, referral placed. Recommended to increase vegetables, fruit, whole grains, water. Exam unremarkable. Labs overall stable except for PSA and Trigs which have been addressed. Follow up in 1 year.

## 2016-12-13 ENCOUNTER — Encounter: Payer: Self-pay | Admitting: Internal Medicine

## 2016-12-29 ENCOUNTER — Other Ambulatory Visit: Payer: Self-pay | Admitting: Primary Care

## 2016-12-29 DIAGNOSIS — I10 Essential (primary) hypertension: Secondary | ICD-10-CM

## 2017-01-13 ENCOUNTER — Ambulatory Visit (AMBULATORY_SURGERY_CENTER): Payer: Self-pay

## 2017-01-13 ENCOUNTER — Other Ambulatory Visit: Payer: Self-pay

## 2017-01-13 VITALS — Ht 68.0 in | Wt 195.8 lb

## 2017-01-13 DIAGNOSIS — Z86010 Personal history of colonic polyps: Secondary | ICD-10-CM

## 2017-01-13 MED ORDER — NA SULFATE-K SULFATE-MG SULF 17.5-3.13-1.6 GM/177ML PO SOLN: 1.0000 | Freq: Once | ORAL | 0 refills | Status: AC

## 2017-01-13 NOTE — Progress Notes (Signed)
Denies allergies to eggs or soy products. Denies complication of anesthesia or sedation. Denies use of weight loss medication. Denies use of O2.   Emmi instructions declined.  

## 2017-01-15 ENCOUNTER — Encounter: Payer: Self-pay | Admitting: Internal Medicine

## 2017-01-27 ENCOUNTER — Ambulatory Visit (AMBULATORY_SURGERY_CENTER): Payer: Medicare Other | Admitting: Internal Medicine

## 2017-01-27 ENCOUNTER — Encounter: Payer: Self-pay | Admitting: Internal Medicine

## 2017-01-27 VITALS — BP 132/76 | HR 79 | Temp 99.1°F | Resp 16 | Ht 68.0 in | Wt 195.0 lb

## 2017-01-27 DIAGNOSIS — D125 Benign neoplasm of sigmoid colon: Secondary | ICD-10-CM

## 2017-01-27 DIAGNOSIS — K635 Polyp of colon: Secondary | ICD-10-CM

## 2017-01-27 DIAGNOSIS — Z860100 Personal history of colon polyps, unspecified: Secondary | ICD-10-CM

## 2017-01-27 MED ORDER — SODIUM CHLORIDE 0.9 % IV SOLN: 500.0000 mL | INTRAVENOUS | Status: DC

## 2017-01-27 NOTE — Patient Instructions (Signed)
YOU HAD AN ENDOSCOPIC PROCEDURE TODAY AT [facility]:   Refer to the procedure report that was given to you for any specific questions about what was found during the examination.  If the procedure report does not answer your questions, please call your gastroenterologist to clarify.  If you requested that your care partner not be given the details of your procedure findings, then the procedure report has been included in a sealed envelope for you to review at your convenience later.  YOU SHOULD EXPECT: Some feelings of bloating in the abdomen. Passage of more gas than usual.  Walking can help get rid of the air that was put into your GI tract during the procedure and reduce the bloating. If you had a lower endoscopy (such as a colonoscopy or flexible sigmoidoscopy) you may notice spotting of blood in your stool or on the toilet paper. If you underwent a bowel prep for your procedure, you may not have a normal bowel movement for a few days.  Please Note:  You might notice some irritation and congestion in your nose or some drainage.  This is from the oxygen used during your procedure.  There is no need for concern and it should clear up in a day or so.  SYMPTOMS TO REPORT IMMEDIATELY:   Following lower endoscopy (colonoscopy or flexible sigmoidoscopy):  Excessive amounts of blood in the stool  Significant tenderness or worsening of abdominal pains  Swelling of the abdomen that is new, acute  Fever of 100F or higher  For urgent or emergent issues, a gastroenterologist can be reached at any hour by calling [phone].   DIET:  We do recommend a small meal at first, but then you may proceed to your regular diet.  Drink plenty of fluids but you should avoid alcoholic beverages for 24 hours.  ACTIVITY:  You should plan to take it easy for the rest of today and you should NOT DRIVE or use heavy machinery until tomorrow (because of the sedation medicines used during the test).     FOLLOW UP: Our staff will call the number listed on your records the next business day following your procedure to check on you and address any questions or concerns that you may have regarding the information given to you following your procedure. If we do not reach you, we will leave a message.  However, if you are feeling well and you are not experiencing any problems, there is no need to return our call.  We will assume that you have returned to your regular daily activities without incident.  If any biopsies were taken you will be contacted by phone or by letter within the next 1-3 weeks.  Please call us at [phone] if you have not heard about the biopsies in 3 weeks.   Await for biopsy results to determine next repeat Colonoscopy screening Polyp (handout given) Diverticulosis (handout given)  SIGNATURES/CONFIDENTIALITY: You and/or your care partner have signed paperwork which will be entered into your electronic medical record.  These signatures attest to the fact that that the information above on your After Visit Summary has been reviewed and is understood.  Full responsibility of the confidentiality of this discharge information lies with you and/or your care-partner.

## 2017-01-27 NOTE — Progress Notes (Signed)
Pt's states no medical or surgical changes since previsit or office visit. maw 

## 2017-01-27 NOTE — Progress Notes (Signed)
Called to room to assist during endoscopic procedure.  Patient ID and intended procedure confirmed with present staff. Received instructions for my participation in the procedure from the performing physician.  

## 2017-01-27 NOTE — Progress Notes (Signed)
Report to PACU, RN, vss, BBS= Clear.  

## 2017-01-27 NOTE — Op Note (Signed)
[place] Patient Name: Jerry Wong Procedure Date: 01/27/2017 1:53 PM MRN: [id number] Endoscopist: Jerene Bears , MD Age: 71 Referring MD:  Date of Birth: 10-10-1945 Gender: Male Account #: [id number] Procedure:                Colonoscopy Indications:              Screening for colorectal malignant neoplasm, Last                            colonoscopy 10 years ago Medicines:                Monitored Anesthesia Care Procedure:                Pre-Anesthesia Assessment:                           - Prior to the procedure, a History and Physical                            was performed, and patient medications and                            allergies were reviewed. The patient's tolerance of                            previous anesthesia was also reviewed. The risks                            and benefits of the procedure and the sedation                            options and risks were discussed with the patient.                            All questions were answered, and informed consent                            was obtained. Prior Anticoagulants: The patient has                            taken no previous anticoagulant or antiplatelet                            agents. ASA Grade Assessment: II - A patient with                            mild systemic disease. After reviewing the risks                            and benefits, the patient was deemed in                            satisfactory condition to undergo the procedure.  After obtaining informed consent, the colonoscope                            was passed under direct vision. Throughout the                            procedure, the patient's blood pressure, pulse, and                            oxygen saturations were monitored continuously. The                            Model CF-HQ190L [id number]) scope was introduced                            through the anus and advanced to the the  cecum,                            identified by appendiceal orifice and ileocecal                            valve. The colonoscopy was performed without                            difficulty. The patient tolerated the procedure                            well. The quality of the bowel preparation was                            good. The ileocecal valve, appendiceal orifice, and                            rectum were photographed. Scope In: 1:59:39 PM Scope Out: 2:13:15 PM Scope Withdrawal Time: 0 hours 9 minutes 5 seconds  Total Procedure Duration: 0 hours 13 minutes 36 seconds  Findings:                 The digital rectal exam was normal.                           A 3 mm polyp was found in the sigmoid colon. The                            polyp was sessile. The polyp was removed with a                            cold snare. Resection and retrieval were complete.                           Multiple small-mouthed diverticula were found in                            the sigmoid colon, transverse colon and ascending  colon.                           A small post polypectomy versus hemorrhoidectomy                            scar was found in the distal rectum. The scar                            tissue was healthy in appearance.                           No additional abnormalities were found on                            retroflexion. Complications:            No immediate complications. Estimated Blood Loss:     Estimated blood loss was minimal. Impression:               - One 3 mm polyp in the sigmoid colon, removed with                            a cold snare. Resected and retrieved.                           - Mild diverticulosis in the sigmoid colon, in the                            transverse colon and in the ascending colon. Recommendation:           - Patient has a contact number available for                            emergencies. The signs and  symptoms of potential                            delayed complications were discussed with the                            patient. Return to normal activities tomorrow.                            Written discharge instructions were provided to the                            patient.                           - Resume previous diet.                           - Continue present medications.                           - Await pathology results.                           -  Repeat colonoscopy is recommended. The                            colonoscopy date will be determined after pathology                            results from today's exam become available for                            review. Jerene Bears, MD 01/27/2017 2:16:23 PM This report has been signed electronically.

## 2017-01-28 ENCOUNTER — Telehealth: Payer: Self-pay

## 2017-01-28 ENCOUNTER — Telehealth: Payer: Self-pay | Admitting: *Deleted

## 2017-01-28 NOTE — Telephone Encounter (Signed)
  Follow up Call-  Call back number 01/27/2017  Post procedure Call Back phone  # [phone] hm speak with wife- Jerry Wong  Permission to leave phone message Yes  Some recent data might be hidden     Left message we will return call later today [place]

## 2017-01-28 NOTE — Telephone Encounter (Signed)
  Follow up Call-  Call back number 01/27/2017  Post procedure Call Back phone  # [phone] hm speak with wife- [name]  Permission to leave phone message Yes  Some recent data might be hidden     Patient questions:  Do you have a fever, pain , or abdominal swelling? No. Pain Score  0 *  Have you tolerated food without any problems? Yes.    Have you been able to return to your normal activities? Yes.    Do you have any questions about your discharge instructions: Diet   No. Medications  No. Follow up visit  No.  Do you have questions or concerns about your Care? No.  Actions: * If pain score is 4 or above: No action needed, pain <4.

## 2017-01-30 ENCOUNTER — Encounter: Payer: Self-pay | Admitting: Internal Medicine

## 2017-04-08 ENCOUNTER — Other Ambulatory Visit: Payer: Self-pay | Admitting: Primary Care

## 2017-04-08 DIAGNOSIS — E785 Hyperlipidemia, unspecified: Secondary | ICD-10-CM

## 2017-04-14 ENCOUNTER — Telehealth: Payer: Self-pay | Admitting: Primary Care

## 2017-04-14 NOTE — Telephone Encounter (Signed)
Please notify patient that I received a surgical clearance form, looks like he needs an appointment for surgical clearance including labs and ECG. Please schedule. Surgery date is 04/30/17

## 2017-04-14 NOTE — Telephone Encounter (Signed)
scheduled appt on 04/15/2017

## 2017-04-15 ENCOUNTER — Ambulatory Visit (INDEPENDENT_AMBULATORY_CARE_PROVIDER_SITE_OTHER): Payer: Medicare Other | Admitting: Primary Care

## 2017-04-15 ENCOUNTER — Encounter: Payer: Self-pay | Admitting: Primary Care

## 2017-04-15 VITALS — BP 134/76 | HR 72 | Temp 98.0°F | Ht 68.0 in | Wt 192.5 lb

## 2017-04-15 DIAGNOSIS — Z01818 Encounter for other preprocedural examination: Secondary | ICD-10-CM

## 2017-04-15 NOTE — Progress Notes (Signed)
Subjective:    Patient ID: Jerry Wong, male    DOB: 29-Apr-1945, 72 y.o.   MRN: [id number]  HPI  Mr. Junkin is a 72 year old male who presents today for surgical clearance. He will undergo L3-4 laminectomy and fusion with pedicle screws and TLIF, possible allograft of spine. Surgery is estimated to last 3 hours.   BP Readings from Last 3 Encounters:  04/15/17 134/76  01/27/17 132/76  12/10/16 132/70    He denies chest pain, dizziness, shortness of breath, weakness. He does continue to experience chronic back pain.   Review of Systems  Constitutional: Negative for fever and unexpected weight change.  HENT: Negative for congestion and rhinorrhea.   Respiratory: Negative for shortness of breath.   Cardiovascular: Negative for chest pain.  Gastrointestinal: Negative for abdominal pain.  Musculoskeletal: Positive for back pain.  Neurological: Negative for dizziness, weakness and headaches.       Past Medical History:  Diagnosis Date  . Arthritis   . Cataract   . Chickenpox   . Chronic headaches   . Chronic kidney disease   . Chronic neck and back pain   . History of kidney stones   . Hyperlipidemia   . Hypertension   . Insomnia   . Neuromuscular disorder ([place])   . Prostate cancer [facility])      Social History   Socioeconomic History  . Marital status: Married    Spouse name: [name]  . Number of children: 3  . Years of education: 41  . Highest education level: Not on file  Social Needs  . Financial resource strain: Not on file  . Food insecurity - worry: Not on file  . Food insecurity - inability: Not on file  . Transportation needs - medical: Not on file  . Transportation needs - non-medical: Not on file  Occupational History  . Not on file  Tobacco Use  . Smoking status: Former Research scientist (life sciences)  . Smokeless tobacco: Never Used  . Tobacco comment: quit 35-40 years ago per pt  Substance and Sexual Activity  . Alcohol use: Yes    Alcohol/week: 0.0 oz    Types: 6 - 8 Cans of  beer per week    Comment: Occasional alcohol  . Drug use: No  . Sexual activity: Not on file  Other Topics Concern  . Not on file  Social History Narrative   Patient is married [name]) and lives at home with his wife and son.   Patient has three children.   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   Patient has a high school education.   Patient is right-handed.   Patient drinks one cup of coffee every morning.    Past Surgical History:  Procedure Laterality Date  . BACK SURGERY  1974  . NECK SURGERY  2002  . PROSTATECTOMY    . TONSILLECTOMY  1958    Family History  Problem Relation Age of Onset  . Arthritis Mother   . Lung cancer Mother   . Arthritis Father   . Lung cancer Father   . Hyperlipidemia Father   . Hypertension Father   . Stroke Father   . Prostate cancer Brother   . Stomach cancer Maternal Grandmother   . Colon cancer Neg Hx   . Esophageal cancer Neg Hx   . Pancreatic cancer Neg Hx   . Rectal cancer Neg Hx     No Known Allergies  Current Outpatient Medications on File Prior to Visit  Medication Sig Dispense Refill  . amitriptyline (ELAVIL) 75 MG tablet Take 1 tablet (75 mg total) by mouth at bedtime. 90 tablet 3  . amLODipine (NORVASC) 10 MG tablet TAKE 1 TABLET BY MOUTH ONCE DAILY 90 tablet 1  . aspirin EC 81 MG tablet Take 81 mg by mouth daily.     . hydrochlorothiazide (HYDRODIURIL) 25 MG tablet TAKE 1 TABLET BY MOUTH EVERY DAY 90 tablet 1  . LYRICA 100 MG capsule TAKE 1 CAPSULE BY MOUTH TWICE A DAY AS DIRECTED  2  . Omega-3 Fatty Acids (FISH OIL) 1000 MG CAPS Take 1 capsule by mouth once daily with a meal. 90 capsule 3  . oxyCODONE-acetaminophen (PERCOCET) 10-325 MG per tablet Take 1 tablet by mouth as needed for pain.     . simvastatin (ZOCOR) 40 MG tablet TAKE 1 TABLET BY MOUTH EVERY EVENING 90 tablet 1  . tiZANidine (ZANAFLEX) 4 MG tablet TAKE 1 TABLET (4 MG TOTAL) BY MOUTH 2 (TWO) TIMES DAILY. 180 tablet 3  . zolpidem (AMBIEN) 10 MG  tablet Take 10 mg by mouth at bedtime as needed.  4   No current facility-administered medications on file prior to visit.     BP 134/76   Pulse 72   Temp 98 F (36.7 C) (Oral)   Ht 5\' 8"  (1.727 m)   Wt 192 lb 8 oz (87.3 kg)   SpO2 98%   BMI 29.27 kg/m    Objective:   Physical Exam  Constitutional: He is oriented to person, place, and time. He appears well-nourished.  HENT:  Right Ear: Tympanic membrane and ear canal normal.  Left Ear: Tympanic membrane and ear canal normal.  Nose: Nose normal. Right sinus exhibits no maxillary sinus tenderness and no frontal sinus tenderness. Left sinus exhibits no maxillary sinus tenderness and no frontal sinus tenderness.  Mouth/Throat: Oropharynx is clear and moist.  Eyes: Conjunctivae and EOM are normal. Pupils are equal, round, and reactive to light.  Neck: Neck supple. Carotid bruit is not present. No thyromegaly present.  Cardiovascular: Normal rate, regular rhythm and normal heart sounds.  Pulmonary/Chest: Effort normal and breath sounds normal. He has no wheezes. He has no rales.  Abdominal: Soft. Bowel sounds are normal. There is no tenderness.  Neurological: He is alert and oriented to person, place, and time. He has normal reflexes. No cranial nerve deficit.  Skin: Skin is warm and dry.  Psychiatric: He has a normal mood and affect.          Assessment & Plan:  Pre operative clearance:  Scheduled for L3-4 laminectomy and fusion with pedicle screws and TLIF, also possible bone marrow aspiration on April 30, 2017. Exam today unremarkable. ROS negative. Labs pending.  ECG today with NSR with rate of 81, no acute ST elevation/depression, no T-wave inversion or irregular beats. Will fax ECG with labs to surgical center.  Pleas Koch, NP

## 2017-04-15 NOTE — Patient Instructions (Addendum)
Stop by the lab prior to leaving today. I will notify you of your results once received. We will also fax the results to the spine center.  It was a pleasure to see you today!

## 2017-04-16 LAB — LIPID PANEL
Cholesterol: 138 mg/dL (ref 0–200)
HDL: 47.4 mg/dL (ref >39.00)
LDL Cholesterol: 67 mg/dL (ref 0–99)
NonHDL: 90.75
Total CHOL/HDL Ratio: 3
Triglycerides: 120 mg/dL (ref 0.0–149.0)
VLDL: 24 mg/dL (ref 0.0–40.0)

## 2017-04-16 LAB — BASIC METABOLIC PANEL
BUN: 18 mg/dL (ref 6–23)
CO2: 28 mEq/L (ref 19–32)
Calcium: 9.9 mg/dL (ref 8.4–10.5)
Chloride: 100 mEq/L (ref 96–112)
Creatinine, Ser: 1.06 mg/dL (ref 0.40–1.50)
GFR: 73.51 mL/min (ref >60.00)
Glucose, Bld: 99 mg/dL (ref 70–99)
Potassium: 3.8 mEq/L (ref 3.5–5.1)
Sodium: 137 mEq/L (ref 135–145)

## 2017-04-16 LAB — HEMOGLOBIN A1C: Hgb A1c MFr Bld: 6 % (ref 4.6–6.5)

## 2017-04-26 ENCOUNTER — Other Ambulatory Visit: Payer: Self-pay | Admitting: Primary Care

## 2017-04-26 DIAGNOSIS — I10 Essential (primary) hypertension: Secondary | ICD-10-CM

## 2017-06-11 ENCOUNTER — Other Ambulatory Visit: Payer: Self-pay | Admitting: Primary Care

## 2017-06-11 DIAGNOSIS — I10 Essential (primary) hypertension: Secondary | ICD-10-CM

## 2017-09-12 ENCOUNTER — Other Ambulatory Visit: Payer: Self-pay | Admitting: Primary Care

## 2017-09-12 DIAGNOSIS — E785 Hyperlipidemia, unspecified: Secondary | ICD-10-CM

## 2017-09-29 ENCOUNTER — Other Ambulatory Visit: Payer: Self-pay | Admitting: Nurse Practitioner

## 2017-10-19 NOTE — Progress Notes (Signed)
[facility]  PATIENT: Jerry Wong DOB: 1945/07/18   REASON FOR VISIT: follow-up for history of headaches HISTORY FROM:Patient    HISTORY OF PRESENT ILLNESS:Mr.Jerry Wong, 72 year old male returns for yearly  follow-up.He has a history of long-standing headaches which have responded well to amitriptyline and Tizanidine. He has attempted to wean himself off his medications and severe headaches would return.Has history of neck surgery and back surgery in the past.  He has a history of high blood pressure and gout- both controlled on medication.  He returns for reevaluation. He has no new neurologic complaints.   REVIEW OF SYSTEMS: Full 14 system review of systems performed and notable only for those listed, all others are neg:  Constitutional: neg  Cardiovascular: neg Ear/Nose/Throat: neg  Skin: neg Eyes: neg Respiratory: neg Gastroitestinal: neg  Hematology/Lymphatic: neg  Endocrine: neg Musculoskeletal:neg Allergy/Immunology: neg Neurological: Migraine headaches Psychiatric: neg Sleep : neg   ALLERGIES: No Known Allergies  HOME MEDICATIONS: Outpatient Medications Prior to Visit  Medication Sig Dispense Refill  . amitriptyline (ELAVIL) 75 MG tablet Take 1 tablet (75 mg total) by mouth at bedtime. 90 tablet 3  . amLODipine (NORVASC) 10 MG tablet TAKE 1 TABLET BY MOUTH ONCE DAILY 90 tablet 1  . aspirin EC 81 MG tablet Take 81 mg by mouth daily.     . hydrochlorothiazide (HYDRODIURIL) 25 MG tablet TAKE 1 TABLET BY MOUTH EVERY DAY 90 tablet 1  . LYRICA 100 MG capsule TAKE 1 CAPSULE BY MOUTH TWICE A DAY AS DIRECTED  2  . Omega-3 Fatty Acids (FISH OIL) 1000 MG CAPS Take 1 capsule by mouth once daily with a meal. 90 capsule 3  . oxyCODONE-acetaminophen (PERCOCET) 10-325 MG per tablet Take 1 tablet by mouth as needed for pain.     . simvastatin (ZOCOR) 40 MG tablet TAKE 1 TABLET BY MOUTH EVERY EVENING 90 tablet 1  . tiZANidine (ZANAFLEX) 4 MG tablet TAKE 1 TABLET  BY MOUTH TWICE A DAY 180 tablet 3  . zolpidem (AMBIEN) 10 MG tablet Take 10 mg by mouth at bedtime as needed.  4   No facility-administered medications prior to visit.     PAST MEDICAL HISTORY: Past Medical History:  Diagnosis Date  . Arthritis   . Cataract   . Chickenpox   . Chronic headaches   . Chronic kidney disease   . Chronic neck and back pain   . History of kidney stones   . Hyperlipidemia   . Hypertension   . Insomnia   . Neuromuscular disorder ([place])   . Prostate cancer ([place])     PAST SURGICAL HISTORY: Past Surgical History:  Procedure Laterality Date  . BACK SURGERY  1974  . NECK SURGERY  2002  . PROSTATECTOMY    . TONSILLECTOMY  1958    FAMILY HISTORY: Family History  Problem Relation Age of Onset  . Arthritis Mother   . Lung cancer Mother   . Arthritis Father   . Lung cancer Father   . Hyperlipidemia Father   . Hypertension Father   . Stroke Father   . Prostate cancer Brother   . Stomach cancer Maternal Grandmother   . Colon cancer Neg Hx   . Esophageal cancer Neg Hx   . Pancreatic cancer Neg Hx   . Rectal cancer Neg Hx     SOCIAL HISTORY: Social History   Socioeconomic History  . Marital status: Married    Spouse name: [name]  . Number of children: 3  . Years  of education: 53  . Highest education level: Not on file  Occupational History  . Not on file  Social Needs  . Financial resource strain: Not on file  . Food insecurity:    Worry: Not on file    Inability: Not on file  . Transportation needs:    Medical: Not on file    Non-medical: Not on file  Tobacco Use  . Smoking status: Former Research scientist (life sciences)  . Smokeless tobacco: Never Used  . Tobacco comment: quit 35-40 years ago per pt  Substance and Sexual Activity  . Alcohol use: Yes    Alcohol/week: 6.0 - 8.0 standard drinks    Types: 6 - 8 Cans of beer per week    Comment: Occasional alcohol  . Drug use: No  . Sexual activity: Not on file  Lifestyle  . Physical activity:    Days per  week: Not on file    Minutes per session: Not on file  . Stress: Not on file  Relationships  . Social connections:    Talks on phone: Not on file    Gets together: Not on file    Attends religious service: Not on file    Active member of club or organization: Not on file    Attends meetings of clubs or organizations: Not on file    Relationship status: Not on file  . Intimate partner violence:    Fear of current or ex partner: Not on file    Emotionally abused: Not on file    Physically abused: Not on file    Forced sexual activity: Not on file  Other Topics Concern  . Not on file  Social History Narrative   Patient is married [name]) and lives at home with his wife and son.   Patient has three children.   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   Patient has a high school education.   Patient is right-handed.   Patient drinks one cup of coffee every morning.     PHYSICAL EXAM  Vitals:   10/20/17 1419  BP: 122/64  Pulse: 76  Weight: 208 lb (94.3 kg)  Height: 5\' 8"  (1.727 m)   Body mass index is 31.63 kg/m. General: well developed, well nourished, seated, in no evident distress Head: head normocephalic and atraumatic. Oropharynx benign Neck: supple ,  Neurologic Exam Mental Status: Awake and fully alert. Oriented to place and time. Follows all commands. Speech and language normal.  Cranial Nerves: . Pupils equal, briskly reactive to light. Extraocular movements full without nystagmus. Visual fields full to confrontation.No ptosis Hearing intact and symmetric to finger snap. Facial sensation intact. Face, tongue, palate move normally and symmetrically. Neck flexion and extension normal.  Motor: Normal bulk and tone. Normal strength in all tested extremity muscles.No focal weakness Coordination: Rapid alternating movements normal in all extremities. Finger-to-nose and heel-to-shin performed accurately bilaterally. Gait and Station: Arises from chair without  difficulty. Stance is normal. Gait demonstrates normal stride length and balance .  Reflexes: 1+ and symmetric. Toes downgoing.     DIAGNOSTIC DATA (LABS, IMAGING, TESTING) - ASSESSMENT AND PLAN  72 y.o. year old male  has a past medical history of here to follow-up for his migraine headaches which are in excellent control.   PLAN: Continue amitriptyline daily will refill Continue tizanidine  twice daily will refill Follow-up yearly and when necessary Call for worsening headaches Dennie Bible, [facility], [facility], APRN  [facility] [address], [address]  [place], [place] [address] [phone]

## 2017-10-20 ENCOUNTER — Ambulatory Visit: Payer: Medicare Other | Admitting: Nurse Practitioner

## 2017-10-20 ENCOUNTER — Encounter: Payer: Self-pay | Admitting: Nurse Practitioner

## 2017-10-20 VITALS — BP 122/64 | HR 76 | Ht 68.0 in | Wt 208.0 lb

## 2017-10-20 DIAGNOSIS — R519 Headache, unspecified: Secondary | ICD-10-CM

## 2017-10-20 DIAGNOSIS — R51 Headache: Secondary | ICD-10-CM

## 2017-10-20 MED ORDER — TIZANIDINE HCL 4 MG PO TABS: ORAL_TABLET | ORAL | 3 refills | Status: DC

## 2017-10-20 MED ORDER — AMITRIPTYLINE HCL 75 MG PO TABS: 75.0000 mg | ORAL_TABLET | Freq: Every day | ORAL | 3 refills | Status: DC

## 2017-10-20 NOTE — Patient Instructions (Signed)
Continue amitriptyline daily will refill Continue tizanidine  twice daily will refill Follow-up yearly and when necessary Call for worsening headaches

## 2017-10-28 ENCOUNTER — Other Ambulatory Visit: Payer: Self-pay | Admitting: Primary Care

## 2017-10-28 DIAGNOSIS — I10 Essential (primary) hypertension: Secondary | ICD-10-CM

## 2017-11-12 ENCOUNTER — Encounter: Payer: Medicare Other | Admitting: Primary Care

## 2017-11-20 HISTORY — PX: TRIGGER FINGER RELEASE: SHX641

## 2017-11-22 ENCOUNTER — Other Ambulatory Visit: Payer: Self-pay | Admitting: Primary Care

## 2017-11-22 DIAGNOSIS — I10 Essential (primary) hypertension: Secondary | ICD-10-CM

## 2017-12-19 ENCOUNTER — Other Ambulatory Visit: Payer: Self-pay | Admitting: Primary Care

## 2017-12-19 DIAGNOSIS — R7303 Prediabetes: Secondary | ICD-10-CM

## 2017-12-19 DIAGNOSIS — E785 Hyperlipidemia, unspecified: Secondary | ICD-10-CM

## 2017-12-19 DIAGNOSIS — I10 Essential (primary) hypertension: Secondary | ICD-10-CM

## 2017-12-22 ENCOUNTER — Telehealth: Payer: Self-pay | Admitting: Primary Care

## 2017-12-22 ENCOUNTER — Ambulatory Visit (INDEPENDENT_AMBULATORY_CARE_PROVIDER_SITE_OTHER): Payer: Medicare Other

## 2017-12-22 VITALS — BP 152/90 | HR 82 | Temp 97.3°F | Ht 69.0 in | Wt 205.5 lb

## 2017-12-22 DIAGNOSIS — Z0000 Encounter for general adult medical examination without abnormal findings: Secondary | ICD-10-CM | POA: Diagnosis not present

## 2017-12-22 DIAGNOSIS — I10 Essential (primary) hypertension: Secondary | ICD-10-CM

## 2017-12-22 DIAGNOSIS — C61 Malignant neoplasm of prostate: Secondary | ICD-10-CM | POA: Diagnosis not present

## 2017-12-22 DIAGNOSIS — Z23 Encounter for immunization: Secondary | ICD-10-CM | POA: Diagnosis not present

## 2017-12-22 DIAGNOSIS — E785 Hyperlipidemia, unspecified: Secondary | ICD-10-CM

## 2017-12-22 DIAGNOSIS — R7303 Prediabetes: Secondary | ICD-10-CM

## 2017-12-22 LAB — COMPREHENSIVE METABOLIC PANEL
ALT: 12 U/L (ref 0–53)
AST: 17 U/L (ref 0–37)
Albumin: 4.4 g/dL (ref 3.5–5.2)
Alkaline Phosphatase: 61 U/L (ref 39–117)
BUN: 20 mg/dL (ref 6–23)
CO2: 30 mEq/L (ref 19–32)
Calcium: 9.5 mg/dL (ref 8.4–10.5)
Chloride: 102 mEq/L (ref 96–112)
Creatinine, Ser: 1.1 mg/dL (ref 0.40–1.50)
GFR: 70.29 mL/min (ref >60.00)
Glucose, Bld: 113 mg/dL — ABNORMAL HIGH (ref 70–99)
Potassium: 3.9 mEq/L (ref 3.5–5.1)
Sodium: 140 mEq/L (ref 135–145)
Total Bilirubin: 0.5 mg/dL (ref 0.2–1.2)
Total Protein: 7 g/dL (ref 6.0–8.3)

## 2017-12-22 LAB — HEMOGLOBIN A1C: Hgb A1c MFr Bld: 6.1 % (ref 4.6–6.5)

## 2017-12-22 LAB — LIPID PANEL
Cholesterol: 161 mg/dL (ref 0–200)
HDL: 46.9 mg/dL (ref >39.00)
NonHDL: 114.53
Total CHOL/HDL Ratio: 3
Triglycerides: 309 mg/dL — ABNORMAL HIGH (ref 0.0–149.0)
VLDL: 61.8 mg/dL — ABNORMAL HIGH (ref 0.0–40.0)

## 2017-12-22 LAB — LDL CHOLESTEROL, DIRECT: Direct LDL: 89 mg/dL

## 2017-12-22 LAB — PSA: PSA: 12.32 ng/mL — ABNORMAL HIGH (ref 0.10–4.00)

## 2017-12-22 NOTE — Patient Instructions (Signed)
Jerry Wong , Thank you for taking time to come for your Medicare Wellness Visit. I appreciate your ongoing commitment to your health goals. Please review the following plan we discussed and let me know if I can assist you in the future.   These are the goals we discussed: Goals    . Increase water intake     Starting 12/22/2017, I will continue to drink at least 8 glasses of water daily.        This is a list of the screening recommended for you and due dates:  Health Maintenance  Topic Date Due  . Tetanus Vaccine  02/25/2019*  . Colon Cancer Screening  01/28/2027  . Flu Shot  Completed  .  Hepatitis C: One time screening is recommended by Center for Disease Control  (CDC) for  adults born from [birth year] through 1963.   Completed  . Pneumonia vaccines  Completed  *Topic was postponed. The date shown is not the original due date.   Preventive Care for Adults  A healthy lifestyle and preventive care can promote health and wellness. Preventive health guidelines for adults include the following key practices.  . A routine yearly physical is a good way to check with your health care provider about your health and preventive screening. It is a chance to share any concerns and updates on your health and to receive a thorough exam.  . Visit your dentist for a routine exam and preventive care every 6 months. Brush your teeth twice a day and floss once a day. Good oral hygiene prevents tooth decay and gum disease.  . The frequency of eye exams is based on your age, health, family medical history, use  of contact lenses, and other factors. Follow your health care provider's recommendations for frequency of eye exams.  . Eat a healthy diet. Foods like vegetables, fruits, whole grains, low-fat dairy products, and lean protein foods contain the nutrients you need without too many calories. Decrease your intake of foods high in solid fats, added sugars, and salt. Eat the right amount of calories for you.  Get information about a proper diet from your health care provider, if necessary.  . Regular physical exercise is one of the most important things you can do for your health. Most adults should get at least 150 minutes of moderate-intensity exercise (any activity that increases your heart rate and causes you to sweat) each week. In addition, most adults need muscle-strengthening exercises on 2 or more days a week.  Silver Sneakers may be a benefit available to you. To determine eligibility, you may visit the website: www.silversneakers.com or contact program at 731-587-4236 Mon-Fri between 8AM-8PM.   . Maintain a healthy weight. The body mass index (BMI) is a screening tool to identify possible weight problems. It provides an estimate of body fat based on height and weight. Your health care provider can find your BMI and can help you achieve or maintain a healthy weight.   For adults 20 years and older: ? A BMI below 18.5 is considered underweight. ? A BMI of 18.5 to 24.9 is normal. ? A BMI of 25 to 29.9 is considered overweight. ? A BMI of 30 and above is considered obese.   . Maintain normal blood lipids and cholesterol levels by exercising and minimizing your intake of saturated fat. Eat a balanced diet with plenty of fruit and vegetables. Blood tests for lipids and cholesterol should begin at age 24 and be repeated every 5 years. If  your lipid or cholesterol levels are high, you are over 50, or you are at high risk for heart disease, you may need your cholesterol levels checked more frequently. Ongoing high lipid and cholesterol levels should be treated with medicines if diet and exercise are not working.  . If you smoke, find out from your health care provider how to quit. If you do not use tobacco, please do not start.  . If you choose to drink alcohol, please do not consume more than 2 drinks per day. One drink is considered to be 12 ounces (355 mL) of beer, 5 ounces (148 mL) of wine, or  1.5 ounces (44 mL) of liquor.  . If you are 67-28 years old, ask your health care provider if you should take aspirin to prevent strokes.  . Use sunscreen. Apply sunscreen liberally and repeatedly throughout the day. You should seek shade when your shadow is shorter than you. Protect yourself by wearing long sleeves, pants, a wide-brimmed hat, and sunglasses year round, whenever you are outdoors.  . Once a month, do a whole body skin exam, using a mirror to look at the skin on your back. Tell your health care provider of new moles, moles that have irregular borders, moles that are larger than a pencil eraser, or moles that have changed in shape or color.

## 2017-12-22 NOTE — Progress Notes (Signed)
PCP notes:   Health maintenance:  Flu vaccine - administered  Abnormal screenings:   Fall risk  - hx of multiple falls (avg 1 fall every 2 weeks) Fall Risk  12/22/2017 12/06/2016  Falls in the past year? Yes Yes  Number falls in past yr: 2 or more 2 or more  Injury with Fall? No No  Risk Factor Category  High Fall Risk High Fall Risk  Risk for fall due to : Impaired mobility;Impaired balance/gait;History of fall(s) Other (Comment)  Risk for fall due to: Comment - numbness in feet   Patient concerns:   None  Nurse concerns:  None  Next PCP appt:   12/29/2017 @ 1520

## 2017-12-22 NOTE — Telephone Encounter (Signed)
Copied from [place] [id number]. Topic: Quick Communication - See Telephone Encounter >> Dec 22, 2017  1:45 PM Rutherford Nail, [place] wrote: CRM for notification. See Telephone encounter for: 12/22/17. Lorriane Shire with [facility] calling and would like to know the name of the tetanus shot that is being given. Would like to know if an eligibility check for tetanus shots could be done? Provider line CB#: [phone]  Would like a follow up to the patient as well

## 2017-12-22 NOTE — Telephone Encounter (Signed)
Due to HIPPA, cannot give this information to [place]. Will discuss with patient on his appt on 12/29/2017

## 2017-12-22 NOTE — Progress Notes (Signed)
I reviewed health advisor's note, was available for consultation, and agree with documentation and plan.  

## 2017-12-22 NOTE — Progress Notes (Signed)
Subjective:   Jerry Wong is a 72 y.o. male who presents for Medicare Annual/Subsequent preventive examination.  Review of Systems:  N/A Cardiac Risk Factors include: advanced age (>20men, >84 women);male gender;dyslipidemia;hypertension     Objective:    Vitals: BP (!) 152/90 (BP Location: Left Arm, Patient Position: Sitting, Cuff Size: Normal)   Pulse 82   Temp (!) 97.3 F (36.3 C) (Oral)   Ht 5\' 9"  (1.753 m) Comment: shoes  Wt 205 lb 8 oz (93.2 kg)   SpO2 97%   BMI 30.35 kg/m   Body mass index is 30.35 kg/m.  Advanced Directives 12/22/2017 12/06/2016  Does Patient Have a Medical Advance Directive? Yes Yes  Type of Paramedic of [place];Living will [place];Living will  Copy of [place] in Chart? No - copy requested No - copy requested    Tobacco Social History   Tobacco Use  Smoking Status Former Smoker  Smokeless Tobacco Never Used  Tobacco Comment   quit 35-40 years ago per pt     Counseling given: No Comment: quit 35-40 years ago per pt   Clinical Intake:  Pre-visit preparation completed: Yes  Pain : No/denies pain Pain Score: 0-No pain     Nutritional Status: BMI 25 -29 Overweight Nutritional Risks: None Diabetes: No  How often do you need to have someone help you when you read instructions, pamphlets, or other written materials from your doctor or pharmacy?: 1 - Never What is the last grade level you completed in school?: 12th grade  Interpreter Needed?: No  Comments: pt lives with spouse Information entered by :: LPinson, LPN  Past Medical History:  Diagnosis Date  . Arthritis   . Cataract   . Chickenpox   . Chronic headaches   . Chronic kidney disease   . Chronic neck and back pain   . History of kidney stones   . Hyperlipidemia   . Hypertension   . Insomnia   . Neuromuscular disorder ([place])   . Prostate cancer [facility])    Past Surgical History:  Procedure Laterality  Date  . BACK SURGERY  1974  . NECK SURGERY  2002  . PROSTATECTOMY    . TONSILLECTOMY  1958  . TRIGGER FINGER RELEASE Left 11/20/2017   left thumb/Dr. Apolonio Schneiders   Family History  Problem Relation Age of Onset  . Arthritis Mother   . Lung cancer Mother   . Arthritis Father   . Lung cancer Father   . Hyperlipidemia Father   . Hypertension Father   . Stroke Father   . Prostate cancer Brother   . Stomach cancer Maternal Grandmother   . Colon cancer Neg Hx   . Esophageal cancer Neg Hx   . Pancreatic cancer Neg Hx   . Rectal cancer Neg Hx    Social History   Socioeconomic History  . Marital status: Married    Spouse name: Jerry Wong  . Number of children: 3  . Years of education: 20  . Highest education level: Not on file  Occupational History  . Not on file  Social Needs  . Financial resource strain: Not on file  . Food insecurity:    Worry: Not on file    Inability: Not on file  . Transportation needs:    Medical: Not on file    Non-medical: Not on file  Tobacco Use  . Smoking status: Former Research scientist (life sciences)  . Smokeless tobacco: Never Used  . Tobacco comment: quit 35-40  years ago per pt  Substance and Sexual Activity  . Alcohol use: Not Currently  . Drug use: No  . Sexual activity: Not Currently  Lifestyle  . Physical activity:    Days per week: Not on file    Minutes per session: Not on file  . Stress: Not on file  Relationships  . Social connections:    Talks on phone: Not on file    Gets together: Not on file    Attends religious service: Not on file    Active member of club or organization: Not on file    Attends meetings of clubs or organizations: Not on file    Relationship status: Not on file  Other Topics Concern  . Not on file  Social History Narrative   Patient is married Jerry Wong) and lives at home with his wife and son.   Patient has three children.   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   Patient has a high school education.   Patient is  right-handed.   Patient drinks one cup of coffee every morning.    Outpatient Encounter Medications as of 12/22/2017  Medication Sig  . amitriptyline (ELAVIL) 75 MG tablet Take 1 tablet (75 mg total) by mouth at bedtime.  Marland Kitchen amLODipine (NORVASC) 10 MG tablet TAKE 1 TABLET BY MOUTH ONCE DAILY  . aspirin EC 81 MG tablet Take 81 mg by mouth daily.   . hydrochlorothiazide (HYDRODIURIL) 25 MG tablet TAKE 1 TABLET BY MOUTH EVERY DAY  . LYRICA 100 MG capsule TAKE 1 CAPSULE BY MOUTH TWICE A DAY AS DIRECTED  . Omega-3 Fatty Acids (FISH OIL) 1000 MG CAPS Take 1 capsule by mouth once daily with a meal.  . oxyCODONE-acetaminophen (PERCOCET) 10-325 MG per tablet Take 1 tablet by mouth as needed for pain.   . simvastatin (ZOCOR) 40 MG tablet TAKE 1 TABLET BY MOUTH EVERY EVENING  . tiZANidine (ZANAFLEX) 4 MG tablet TAKE 1 TABLET BY MOUTH TWICE A DAY  . zolpidem (AMBIEN) 10 MG tablet Take 10 mg by mouth at bedtime as needed.   No facility-administered encounter medications on file as of 12/22/2017.     Activities of Daily Living In your present state of health, do you have any difficulty performing the following activities: 12/22/2017  Hearing? N  Vision? N  Difficulty concentrating or making decisions? N  Walking or climbing stairs? N  Dressing or bathing? N  Doing errands, shopping? N  Preparing Food and eating ? N  Using the Toilet? N  In the past six months, have you accidently leaked urine? N  Do you have problems with loss of bowel control? N  Managing your Medications? N  Managing your Finances? N  Housekeeping or managing your Housekeeping? N  Some recent data might be hidden    Patient Care Team: Pleas Koch, NP as PCP - General (Internal Medicine)   Assessment:   This is a routine wellness examination for Jerry Wong.   Hearing Screening   125Hz  250Hz  500Hz  1000Hz  2000Hz  3000Hz  4000Hz  6000Hz  8000Hz   Right ear:   40 40 40  40    Left ear:   40 40 40  40    Vision Screening  Comments: Vision exam in Winter 2018  Exercise Activities and Dietary recommendations Current Exercise Habits: The patient does not participate in regular exercise at present, Exercise limited by: None identified  Goals    . Increase water intake     Starting 12/22/2017, I  will continue to drink at least 8 glasses of water daily.        Fall Risk Fall Risk  12/22/2017 12/06/2016  Falls in the past year? Yes Yes  Number falls in past yr: 2 or more 2 or more  Injury with Fall? No No  Risk Factor Category  High Fall Risk High Fall Risk  Risk for fall due to : Impaired mobility;Impaired balance/gait;History of fall(s) Other (Comment)  Risk for fall due to: Comment - numbness in feet   Depression Screen PHQ 2/9 Scores 12/22/2017 12/06/2016  PHQ - 2 Score 0 0  PHQ- 9 Score 0 0    Cognitive Function MMSE - Mini Mental State Exam 12/22/2017 12/06/2016  Orientation to time 5 5  Orientation to Place 5 5  Registration 3 3  Attention/ Calculation 0 0  Recall 3 2  Recall-comments - unable to recall 1 of 3 words  Language- name 2 objects 0 0  Language- repeat 1 1  Language- follow 3 step command 3 2  Language- follow 3 step command-comments - unable to complete 1 step of 3 step command  Language- read & follow direction 0 0  Write a sentence 0 0  Copy design 0 0  Total score 20 18     PLEASE NOTE: A Mini-Cog screen was completed. Maximum score is 20. A value of 0 denotes this part of [place] MMSE was not completed or the patient failed this part of the Mini-Cog screening.   Mini-Cog Screening Orientation to Time - Max 5 pts Orientation to Place - Max 5 pts Registration - Max 3 pts Recall - Max 3 pts Language Repeat - Max 1 pts Language Follow 3 Step Command - Max 3 pts     Immunization History  Administered Date(s) Administered  . Influenza, High Dose Seasonal PF 12/06/2016, 12/22/2017  . Influenza-Unspecified 12/21/2017  . Pneumococcal Conjugate-13 02/28/2014  .  Pneumococcal Polysaccharide-23 12/06/2016  . Tdap 02/09/2007    Screening Tests Health Maintenance  Topic Date Due  . TETANUS/TDAP  02/25/2019 (Originally 02/08/2017)  . COLONOSCOPY  01/28/2027  . INFLUENZA VACCINE  Completed  . Hepatitis C Screening  Completed  . PNA vac Low Risk Adult  Completed       Plan:     I have personally reviewed, addressed, and noted the following in the patient's chart:  A. Medical and social history B. Use of alcohol, tobacco or illicit drugs  C. Current medications and supplements D. Functional ability and status E.  Nutritional status F.  Physical activity G. Advance directives H. List of other physicians I.  Hospitalizations, surgeries, and ER visits in previous 12 months J.  [place] to include hearing, vision, cognitive, depression L. Referrals and appointments - none  In addition, I have reviewed and discussed with patient certain preventive protocols, quality metrics, and best practice recommendations. A written personalized care plan for preventive services as well as general preventive health recommendations were provided to patient.  See attached scanned questionnaire for additional information.   Signed,   Lindell Noe, MHA, BS, LPN Health Coach

## 2017-12-29 ENCOUNTER — Ambulatory Visit (INDEPENDENT_AMBULATORY_CARE_PROVIDER_SITE_OTHER): Payer: Medicare Other | Admitting: Primary Care

## 2017-12-29 ENCOUNTER — Encounter: Payer: Self-pay | Admitting: Primary Care

## 2017-12-29 VITALS — BP 126/76 | HR 86 | Temp 98.3°F | Ht 69.0 in | Wt 205.5 lb

## 2017-12-29 DIAGNOSIS — G8929 Other chronic pain: Secondary | ICD-10-CM | POA: Insufficient documentation

## 2017-12-29 DIAGNOSIS — G4701 Insomnia due to medical condition: Secondary | ICD-10-CM | POA: Insufficient documentation

## 2017-12-29 DIAGNOSIS — Z23 Encounter for immunization: Secondary | ICD-10-CM | POA: Diagnosis not present

## 2017-12-29 DIAGNOSIS — R7303 Prediabetes: Secondary | ICD-10-CM

## 2017-12-29 DIAGNOSIS — G4700 Insomnia, unspecified: Secondary | ICD-10-CM

## 2017-12-29 DIAGNOSIS — M549 Dorsalgia, unspecified: Secondary | ICD-10-CM

## 2017-12-29 DIAGNOSIS — I10 Essential (primary) hypertension: Secondary | ICD-10-CM

## 2017-12-29 DIAGNOSIS — R51 Headache: Secondary | ICD-10-CM | POA: Diagnosis not present

## 2017-12-29 DIAGNOSIS — Z0000 Encounter for general adult medical examination without abnormal findings: Secondary | ICD-10-CM | POA: Diagnosis not present

## 2017-12-29 DIAGNOSIS — E785 Hyperlipidemia, unspecified: Secondary | ICD-10-CM

## 2017-12-29 DIAGNOSIS — C61 Malignant neoplasm of prostate: Secondary | ICD-10-CM | POA: Diagnosis not present

## 2017-12-29 DIAGNOSIS — M542 Cervicalgia: Secondary | ICD-10-CM

## 2017-12-29 DIAGNOSIS — R519 Headache, unspecified: Secondary | ICD-10-CM

## 2017-12-29 MED ORDER — ZOSTER VAC RECOMB ADJUVANTED 50 MCG/0.5ML IM SUSR: 0.5000 mL | Freq: Once | INTRAMUSCULAR | 1 refills | Status: AC

## 2017-12-29 MED ORDER — ATORVASTATIN CALCIUM 20 MG PO TABS: 20.0000 mg | ORAL_TABLET | Freq: Every day | ORAL | 1 refills | Status: DC

## 2017-12-29 NOTE — Assessment & Plan Note (Signed)
Stable in the office today, continue amlodipine 10 mg and HCTZ 25 mg. BMP unremarkable.

## 2017-12-29 NOTE — Assessment & Plan Note (Signed)
Following with Urology. Recent PSA with level of 12.

## 2017-12-29 NOTE — Addendum Note (Signed)
Addended by: Jacqualin Combes on: 12/29/2017 05:44 PM   Modules accepted: Orders

## 2017-12-29 NOTE — Assessment & Plan Note (Signed)
Doing well on Ambien, continue same.

## 2017-12-29 NOTE — Progress Notes (Signed)
Subjective:    Patient ID: Jerry Wong, male    DOB: Jun 15, 1945, 72 y.o.   MRN: [id number]  HPI  Mr. Punch is a 72 year old male who presents today for complete physical. He saw our wellness nurse last week.   Immunizations: -Tetanus: Completed in 2008. Due today. -Influenza: Completed this season -Pneumonia: Completed last in 2018 -Shingles: Never completed, drug store is out of stock  Diet: He endorses a healthy diet Breakfast: Skips, occasional muffin Lunch: Skips Dinner: Canned beans, vegetables, meat, starch Snacks: None Desserts: Rarely  Beverages: Water, diet soda  Exercise: He is not exercising Eye exam: Completed several years ago Dental exam: Completed in 2019 Colonoscopy: Completed in 2018 PSA: Elevated in 2019 Hep C Screen: Completed in 2018  BP Readings from Last 3 Encounters:  12/29/17 126/76  12/22/17 (!) 152/90  10/20/17 122/64      Review of Systems  Constitutional: Negative for unexpected weight change.  HENT: Negative for rhinorrhea.   Respiratory: Negative for cough and shortness of breath.   Cardiovascular: Negative for chest pain.  Gastrointestinal: Negative for constipation and diarrhea.  Genitourinary: Negative for difficulty urinating.  Musculoskeletal: Positive for arthralgias and back pain.  Skin: Negative for rash.  Allergic/Immunologic: Negative for environmental allergies.  Neurological: Negative for dizziness, numbness and headaches.  Psychiatric/Behavioral:       Doing well on Ambien.       Past Medical History:  Diagnosis Date  . Arthritis   . Cataract   . Chickenpox   . Chronic headaches   . Chronic kidney disease   . Chronic neck and back pain   . History of kidney stones   . Hyperlipidemia   . Hypertension   . Insomnia   . Neuromuscular disorder ([place])   . Prostate cancer [facility])      Social History   Socioeconomic History  . Marital status: Married    Spouse name: [name]  . Number of children: 3  . Years of  education: 39  . Highest education level: Not on file  Occupational History  . Not on file  Social Needs  . Financial resource strain: Not on file  . Food insecurity:    Worry: Not on file    Inability: Not on file  . Transportation needs:    Medical: Not on file    Non-medical: Not on file  Tobacco Use  . Smoking status: Former Research scientist (life sciences)  . Smokeless tobacco: Never Used  . Tobacco comment: quit 35-40 years ago per pt  Substance and Sexual Activity  . Alcohol use: Not Currently  . Drug use: No  . Sexual activity: Not Currently  Lifestyle  . Physical activity:    Days per week: Not on file    Minutes per session: Not on file  . Stress: Not on file  Relationships  . Social connections:    Talks on phone: Not on file    Gets together: Not on file    Attends religious service: Not on file    Active member of club or organization: Not on file    Attends meetings of clubs or organizations: Not on file    Relationship status: Not on file  . Intimate partner violence:    Fear of current or ex partner: Not on file    Emotionally abused: Not on file    Physically abused: Not on file    Forced sexual activity: Not on file  Other Topics Concern  . Not on file  Social History Narrative   Patient is married [name]) and lives at home with his wife and son.   Patient has three children.   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   Patient has a high school education.   Patient is right-handed.   Patient drinks one cup of coffee every morning.    Past Surgical History:  Procedure Laterality Date  . BACK SURGERY  1974  . NECK SURGERY  2002  . PROSTATECTOMY    . TONSILLECTOMY  1958  . TRIGGER FINGER RELEASE Left 11/20/2017   left thumb/Dr. Apolonio Schneiders    Family History  Problem Relation Age of Onset  . Arthritis Mother   . Lung cancer Mother   . Arthritis Father   . Lung cancer Father   . Hyperlipidemia Father   . Hypertension Father   . Stroke Father   . Prostate  cancer Brother   . Stomach cancer Maternal Grandmother   . Colon cancer Neg Hx   . Esophageal cancer Neg Hx   . Pancreatic cancer Neg Hx   . Rectal cancer Neg Hx     No Known Allergies  Current Outpatient Medications on File Prior to Visit  Medication Sig Dispense Refill  . amitriptyline (ELAVIL) 75 MG tablet Take 1 tablet (75 mg total) by mouth at bedtime. 90 tablet 3  . amLODipine (NORVASC) 10 MG tablet TAKE 1 TABLET BY MOUTH ONCE DAILY 90 tablet 1  . aspirin EC 81 MG tablet Take 81 mg by mouth daily.     . hydrochlorothiazide (HYDRODIURIL) 25 MG tablet TAKE 1 TABLET BY MOUTH EVERY DAY 90 tablet 1  . LYRICA 100 MG capsule TAKE 1 CAPSULE BY MOUTH TWICE A DAY AS DIRECTED  2  . Omega-3 Fatty Acids (FISH OIL) 1000 MG CAPS Take 1 capsule by mouth once daily with a meal. 90 capsule 3  . oxyCODONE-acetaminophen (PERCOCET) 10-325 MG per tablet Take 1 tablet by mouth as needed for pain.     . simvastatin (ZOCOR) 40 MG tablet TAKE 1 TABLET BY MOUTH EVERY EVENING 90 tablet 1  . tiZANidine (ZANAFLEX) 4 MG tablet TAKE 1 TABLET BY MOUTH TWICE A DAY 180 tablet 3  . zolpidem (AMBIEN) 10 MG tablet Take 10 mg by mouth at bedtime as needed.  4   No current facility-administered medications on file prior to visit.     BP 126/76   Pulse 86   Temp 98.3 F (36.8 C) (Oral)   Ht 5\' 9"  (1.753 m)   Wt 205 lb 8 oz (93.2 kg)   SpO2 96%   BMI 30.35 kg/m    Objective:   Physical Exam  Constitutional: He is oriented to person, place, and time. He appears well-nourished.  HENT:  Mouth/Throat: No oropharyngeal exudate.  Eyes: Pupils are equal, round, and reactive to light. EOM are normal.  Neck: Neck supple. No thyromegaly present.  Cardiovascular: Normal rate and regular rhythm.  Respiratory: Effort normal and breath sounds normal.  GI: Soft. Bowel sounds are normal. There is no tenderness.  Musculoskeletal: Normal range of motion.  Neurological: He is alert and oriented to person, place, and time.    Skin: Skin is warm and dry.  Psychiatric: He has a normal mood and affect.           Assessment & Plan:

## 2017-12-29 NOTE — Assessment & Plan Note (Signed)
A1C stable over the last several years. Discussed to start some form of exercise, continue to work on diet. Continue to monitor annually.

## 2017-12-29 NOTE — Assessment & Plan Note (Signed)
Following with pain management, continue Percocet and Lyrica.

## 2017-12-29 NOTE — Assessment & Plan Note (Signed)
Doing well on amitriptyline nightly and tizanidine PRN. Following with neurology. No recent headaches.

## 2017-12-29 NOTE — Patient Instructions (Signed)
Stop simvastatin 40 mg tablets for cholesterol. Start atorvastatin 20 mg tablets for cholesterol. Take one tablet once daily.  It's important to improve your diet by increase consumption of fresh vegetables and fruits, whole grains, water.  Ensure you are drinking 64 ounces of water daily.  Start exercising. You should be getting 150 minutes of exercise weekly.  Schedule a lab only appointment to return in 6 weeks for choleserol check. Make sure you are fasting 4 hours prior. You may have water and black coffee.   We will see you next year for your annual exam or sooner if needed.  It was a pleasure to see you today!   Preventive Care 69 Years and Older, Male Preventive care refers to lifestyle choices and visits with your health care provider that can promote health and wellness. What does preventive care include?  A yearly physical exam. This is also called an annual well check.  Dental exams once or twice a year.  Routine eye exams. Ask your health care provider how often you should have your eyes checked.  Personal lifestyle choices, including: ? Daily care of your teeth and gums. ? Regular physical activity. ? Eating a healthy diet. ? Avoiding tobacco and drug use. ? Limiting alcohol use. ? Practicing safe sex. ? Taking low doses of aspirin every day. ? Taking vitamin and mineral supplements as recommended by your health care provider. What happens during an annual well check? The services and screenings done by your health care provider during your annual well check will depend on your age, overall health, lifestyle risk factors, and family history of disease. Counseling Your health care provider may ask you questions about your:  Alcohol use.  Tobacco use.  Drug use.  Emotional well-being.  Home and relationship well-being.  Sexual activity.  Eating habits.  History of falls.  Memory and ability to understand (cognition).  Work and work  Statistician.  Screening You may have the following tests or measurements:  Height, weight, and BMI.  Blood pressure.  Lipid and cholesterol levels. These may be checked every 5 years, or more frequently if you are over 35 years old.  Skin check.  Lung cancer screening. You may have this screening every year starting at age 12 if you have a 30-pack-year history of smoking and currently smoke or have quit within the past 15 years.  Fecal occult blood test (FOBT) of the stool. You may have this test every year starting at age 50.  Flexible sigmoidoscopy or colonoscopy. You may have a sigmoidoscopy every 5 years or a colonoscopy every 10 years starting at age 90.  Prostate cancer screening. Recommendations will vary depending on your family history and other risks.  Hepatitis C blood test.  Hepatitis B blood test.  Sexually transmitted disease (STD) testing.  Diabetes screening. This is done by checking your blood sugar (glucose) after you have not eaten for a while (fasting). You may have this done every 1-3 years.  Abdominal aortic aneurysm (AAA) screening. You may need this if you are a current or former smoker.  Osteoporosis. You may be screened starting at age 19 if you are at high risk.  Talk with your health care provider about your test results, treatment options, and if necessary, the need for more tests. Vaccines Your health care provider may recommend certain vaccines, such as:  Influenza vaccine. This is recommended every year.  Tetanus, diphtheria, and acellular pertussis (Tdap, Td) vaccine. You may need a Td booster every 10  years.  Varicella vaccine. You may need this if you have not been vaccinated.  Zoster vaccine. You may need this after age 32.  Measles, mumps, and rubella (MMR) vaccine. You may need at least one dose of MMR if you were born in 1957 or later. You may also need a second dose.  Pneumococcal 13-valent conjugate (PCV13) vaccine. One dose is  recommended after age 71.  Pneumococcal polysaccharide (PPSV23) vaccine. One dose is recommended after age 33.  Meningococcal vaccine. You may need this if you have certain conditions.  Hepatitis A vaccine. You may need this if you have certain conditions or if you travel or work in places where you may be exposed to hepatitis A.  Hepatitis B vaccine. You may need this if you have certain conditions or if you travel or work in places where you may be exposed to hepatitis B.  Haemophilus influenzae type b (Hib) vaccine. You may need this if you have certain risk factors.  Talk to your health care provider about which screenings and vaccines you need and how often you need them. This information is not intended to replace advice given to you by your health care provider. Make sure you discuss any questions you have with your health care provider. Document Released: 03/10/2015 Document Revised: 11/01/2015 Document Reviewed: 12/13/2014 Elsevier Interactive Patient Education  [employer].

## 2017-12-29 NOTE — Assessment & Plan Note (Signed)
Tetanus due, provided today. Rx for shingrix printed. PSA UTD, following with Urology. Colonoscopy UTD Encouraged regular exercise, healthy diet. Exam stable. Labs reviewed. Follow up in 1 year for CPE.

## 2017-12-29 NOTE — Assessment & Plan Note (Signed)
Compliant to simvastatin and Fish Oil. Trigs in the 300's, LDL of 89. Will switch from Simvastatin to atorvastatin 20 mg. Repeat lipids in 6 weeks.

## 2018-01-06 ENCOUNTER — Encounter: Payer: Self-pay | Admitting: *Deleted

## 2018-02-05 ENCOUNTER — Other Ambulatory Visit: Payer: Self-pay | Admitting: Primary Care

## 2018-02-05 DIAGNOSIS — E785 Hyperlipidemia, unspecified: Secondary | ICD-10-CM

## 2018-02-06 ENCOUNTER — Other Ambulatory Visit: Payer: Self-pay | Admitting: Primary Care

## 2018-02-06 DIAGNOSIS — E785 Hyperlipidemia, unspecified: Secondary | ICD-10-CM

## 2018-02-09 ENCOUNTER — Other Ambulatory Visit (INDEPENDENT_AMBULATORY_CARE_PROVIDER_SITE_OTHER): Payer: Medicare Other

## 2018-02-09 DIAGNOSIS — E785 Hyperlipidemia, unspecified: Secondary | ICD-10-CM

## 2018-02-09 LAB — HEPATIC FUNCTION PANEL
ALT: 14 U/L (ref 0–53)
AST: 19 U/L (ref 0–37)
Albumin: 4.4 g/dL (ref 3.5–5.2)
Alkaline Phosphatase: 59 U/L (ref 39–117)
Bilirubin, Direct: 0.1 mg/dL (ref 0.0–0.3)
Total Bilirubin: 0.6 mg/dL (ref 0.2–1.2)
Total Protein: 6.9 g/dL (ref 6.0–8.3)

## 2018-02-09 LAB — LIPID PANEL
Cholesterol: 128 mg/dL (ref 0–200)
HDL: 40.3 mg/dL (ref >39.00)
LDL Cholesterol: 51 mg/dL (ref 0–99)
NonHDL: 87.92
Total CHOL/HDL Ratio: 3
Triglycerides: 185 mg/dL — ABNORMAL HIGH (ref 0.0–149.0)
VLDL: 37 mg/dL (ref 0.0–40.0)

## 2018-03-06 ENCOUNTER — Other Ambulatory Visit: Payer: Self-pay | Admitting: Primary Care

## 2018-03-06 DIAGNOSIS — E785 Hyperlipidemia, unspecified: Secondary | ICD-10-CM

## 2018-04-04 ENCOUNTER — Other Ambulatory Visit: Payer: Self-pay | Admitting: Primary Care

## 2018-04-04 DIAGNOSIS — I10 Essential (primary) hypertension: Secondary | ICD-10-CM

## 2018-04-14 NOTE — Telephone Encounter (Signed)
Charted in error/duplicate.

## 2018-05-01 ENCOUNTER — Other Ambulatory Visit: Payer: Self-pay | Admitting: Primary Care

## 2018-05-01 DIAGNOSIS — I10 Essential (primary) hypertension: Secondary | ICD-10-CM

## 2018-06-12 ENCOUNTER — Other Ambulatory Visit: Payer: Self-pay | Admitting: Primary Care

## 2018-06-12 DIAGNOSIS — E785 Hyperlipidemia, unspecified: Secondary | ICD-10-CM

## 2018-09-09 ENCOUNTER — Other Ambulatory Visit: Payer: Self-pay | Admitting: Primary Care

## 2018-09-09 DIAGNOSIS — I10 Essential (primary) hypertension: Secondary | ICD-10-CM

## 2018-09-29 ENCOUNTER — Other Ambulatory Visit: Payer: Self-pay | Admitting: *Deleted

## 2018-09-29 MED ORDER — AMITRIPTYLINE HCL 75 MG PO TABS: 75.0000 mg | ORAL_TABLET | Freq: Every day | ORAL | 3 refills | Status: DC

## 2018-09-29 MED ORDER — TIZANIDINE HCL 4 MG PO TABS: ORAL_TABLET | ORAL | 3 refills | Status: DC

## 2018-10-21 ENCOUNTER — Other Ambulatory Visit: Payer: Self-pay | Admitting: Primary Care

## 2018-10-21 DIAGNOSIS — I10 Essential (primary) hypertension: Secondary | ICD-10-CM

## 2018-10-26 ENCOUNTER — Other Ambulatory Visit: Payer: Self-pay

## 2018-10-26 ENCOUNTER — Ambulatory Visit (INDEPENDENT_AMBULATORY_CARE_PROVIDER_SITE_OTHER): Payer: Medicare Other | Admitting: Neurology

## 2018-10-26 ENCOUNTER — Encounter: Payer: Self-pay | Admitting: Neurology

## 2018-10-26 VITALS — BP 138/78 | HR 86 | Temp 98.2°F | Ht 69.0 in | Wt 217.0 lb

## 2018-10-26 DIAGNOSIS — G44229 Chronic tension-type headache, not intractable: Secondary | ICD-10-CM | POA: Diagnosis not present

## 2018-10-26 DIAGNOSIS — G8929 Other chronic pain: Secondary | ICD-10-CM

## 2018-10-26 DIAGNOSIS — M25472 Effusion, left ankle: Secondary | ICD-10-CM

## 2018-10-26 DIAGNOSIS — M25471 Effusion, right ankle: Secondary | ICD-10-CM

## 2018-10-26 DIAGNOSIS — G4701 Insomnia due to medical condition: Secondary | ICD-10-CM

## 2018-10-26 MED ORDER — AMITRIPTYLINE HCL 75 MG PO TABS: 75.0000 mg | ORAL_TABLET | Freq: Every day | ORAL | 3 refills | Status: AC

## 2018-10-26 MED ORDER — ZOLPIDEM TARTRATE 10 MG PO TABS: 10.0000 mg | ORAL_TABLET | Freq: Every evening | ORAL | 4 refills | Status: DC | PRN

## 2018-10-26 MED ORDER — TIZANIDINE HCL 4 MG PO TABS: ORAL_TABLET | ORAL | 3 refills | Status: AC

## 2018-10-26 NOTE — Progress Notes (Signed)
Jerry Wong  PATIENT: Jerry Wong DOB: May 27, 1945   REASON FOR VISIT: follow-up for history of headaches HISTORY FROM:Patient    HISTORY OF PRESENT ILLNESS:  Interval history 10-26-2018,  I have the pleasure of meeting today on 26 October 2018 with Jerry Wong, who has been a long established patient.  For the last 4 years he has seen Cecille Rubin nurse practitioner who has meanwhile retired.  The patient is a former smoker quit over 40 years ago, he developed chronic tension and vascular headaches and responded to amitriptyline.  He has not had migraine attacks with nausea, photophobia, no vertigo and no diplopia.  His headaches are considered well controlled.    Jerry Wong, 73 year old male returns for yearly  follow-up.He has a history of long-standing headaches which have responded well to amitriptyline and Tizanidine. He has attempted to wean himself off his medications and severe headaches would return.Has history of neck surgery and back surgery in the past.  He has a history of high blood pressure and gout- both controlled on medication.  He returns for reevaluation. He has no new neurologic complaints.   REVIEW OF SYSTEMS: Full 14 system review of systems performed and notable only for those listed, all others are   headaches now resolved.   ALLERGIES: No Known Allergies  HOME MEDICATIONS: Outpatient Medications Prior to Visit  Medication Sig Dispense Refill  . amitriptyline (ELAVIL) 75 MG tablet Take 1 tablet (75 mg total) by mouth at bedtime. 90 tablet 3  . amLODipine (NORVASC) 10 MG tablet TAKE 1 TABLET BY MOUTH EVERY DAY 90 tablet 1  . aspirin EC 81 MG tablet Take 81 mg by mouth daily.     Marland Kitchen atorvastatin (LIPITOR) 20 MG tablet TAKE 1 TABLET BY MOUTH EVERY DAY 90 tablet 1  . hydrochlorothiazide (HYDRODIURIL) 25 MG tablet TAKE 1 TABLET BY MOUTH EVERY DAY FOR BLOOD PRESSURE 90 tablet 1  . LYRICA 100 MG capsule TAKE 1 CAPSULE BY MOUTH TWICE  A DAY AS DIRECTED  2  . Omega-3 Fatty Acids ([employer] FISH OIL) 1000 MG CAPS TAKE 1 CAPSULE BY MOUTH EVERY DAY WITH A MEAL 90 capsule 3  . oxyCODONE-acetaminophen (PERCOCET) 10-325 MG per tablet Take 1 tablet by mouth as needed for pain.     . simvastatin (ZOCOR) 40 MG tablet TAKE 1 TABLET BY MOUTH EVERY EVENING 90 tablet 1  . tiZANidine (ZANAFLEX) 4 MG tablet TAKE 1 TABLET BY MOUTH TWICE A DAY 180 tablet 3  . zolpidem (AMBIEN) 10 MG tablet Take 10 mg by mouth at bedtime as needed.  4   No facility-administered medications prior to visit.     PAST MEDICAL HISTORY: Past Medical History:  Diagnosis Date  . Arthritis   . Cataract   . Chickenpox   . Chronic headaches   . Chronic kidney disease   . Chronic neck and back pain   . History of kidney stones   . Hyperlipidemia   . Hypertension   . Insomnia   . Neuromuscular disorder ([place])   . Prostate cancer ([place])     PAST SURGICAL HISTORY: Past Surgical History:  Procedure Laterality Date  . BACK SURGERY  1974  . NECK SURGERY  2002  . PROSTATECTOMY    . TONSILLECTOMY  1958  . TRIGGER FINGER RELEASE Left 11/20/2017   left thumb/Dr. Apolonio Schneiders    FAMILY HISTORY: Family History  Problem Relation Age of Onset  . Arthritis Mother   . Lung cancer Mother   .  Arthritis Father   . Lung cancer Father   . Hyperlipidemia Father   . Hypertension Father   . Stroke Father   . Prostate cancer Brother   . Stomach cancer Maternal Grandmother   . Colon cancer Neg Hx   . Esophageal cancer Neg Hx   . Pancreatic cancer Neg Hx   . Rectal cancer Neg Hx     SOCIAL HISTORY: Social History   Socioeconomic History  . Marital status: Married    Spouse name: [name]  . Number of children: 3  . Years of education: 14  . Highest education level: Not on file  Occupational History  . Not on file  Social Needs  . Financial resource strain: Not on file  . Food insecurity    Worry: Not on file    Inability: Not on file  . Transportation needs     Medical: Not on file    Non-medical: Not on file  Tobacco Use  . Smoking status: Former Research scientist (life sciences)  . Smokeless tobacco: Never Used  . Tobacco comment: quit 35-40 years ago per pt  Substance and Sexual Activity  . Alcohol use: Not Currently  . Drug use: No  . Sexual activity: Not Currently  Lifestyle  . Physical activity    Days per week: Not on file    Minutes per session: Not on file  . Stress: Not on file  Relationships  . Social Herbalist on phone: Not on file    Gets together: Not on file    Attends religious service: Not on file    Active member of club or organization: Not on file    Attends meetings of clubs or organizations: Not on file    Relationship status: Not on file  . Intimate partner violence    Fear of current or ex partner: Not on file    Emotionally abused: Not on file    Physically abused: Not on file    Forced sexual activity: Not on file  Other Topics Concern  . Not on file  Social History Narrative   Patient is married [name]) and lives at home with his wife and son.   Patient has three children, all healthy, no grandchildren.  Lives with 1 dog and 5 chickens.    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   Patient has a high school education.   Patient is right-handed.   Patient drinks one cup of coffee every morning.     PHYSICAL EXAM  Vitals:   10/26/18 1419  BP: 138/78  Pulse: 86  Temp: 98.2 F (36.8 C)  Weight: 217 lb (98.4 kg)  Height: 5\' 9"  (1.753 m)   Body mass index is 32.05 kg/m. General: well developed, well nourished, seated, in no evident distress Head: head normocephalic and atraumatic. Oropharynx benign Neck: supple , able to breath through his nose.   Ankle edema bilaterally- since using LYRICA.  Neurologic Exam:  Mental Status: Awake and fully alert. Oriented to place and time.  Follows all commands. Speech and language normal.   Cranial Nerves: Pupils equal, briskly reactive to light.   Extraocular movements full without nystagmus. Visual fields full to confrontation.No ptosis Hearing intact and symmetric to finger snap. Facial sensation intact. Face, tongue, palate move normally and symmetrically. Neck flexion and extension normal.  Motor: Normal bulk and tone. Normal strength in all tested extremity muscles. Slight pronator-drift on the left, dysmetria on the left, good grip strength.  Coordination:  Rapid alternating movements normal in all extremities.  Finger-to-nose  performed accurately bilaterally- mild dysmetria on finger to nose with the left . He is right hand dominant. . Gait and Station: Arises from chair without difficulty. Stance is normal. Gait demonstrates normal stride length and balance .  Reflexes: 1+ and symmetric, no clonus. Toes downgoing.    No tremors, no dry mouth and no dry eyes on amitriptyline. No memory concerns.  - ASSESSMENT AND PLAN  73 y.o. year old male  has a past medical history of here to follow-up for his migraine headaches which are in excellent control. A couple of years ago he tried to get off medications and had return of his headaches every day for 14 days, he resumed his regimen and is headache free since.     PLAN: Continue amitriptyline daily at current dose- will refill Continue tizanidine twice daily,  will refill Follow-up yearly and when necessary. Call for worsening headaches.   Larey Seat, MD   [facility] [address], [place], [place] [address] [phone]

## 2018-12-04 ENCOUNTER — Other Ambulatory Visit: Payer: Self-pay | Admitting: Primary Care

## 2018-12-04 DIAGNOSIS — E785 Hyperlipidemia, unspecified: Secondary | ICD-10-CM

## 2018-12-14 ENCOUNTER — Telehealth: Payer: Self-pay | Admitting: Primary Care

## 2018-12-14 DIAGNOSIS — E785 Hyperlipidemia, unspecified: Secondary | ICD-10-CM

## 2018-12-14 DIAGNOSIS — I10 Essential (primary) hypertension: Secondary | ICD-10-CM

## 2018-12-14 DIAGNOSIS — R7303 Prediabetes: Secondary | ICD-10-CM

## 2018-12-14 DIAGNOSIS — C61 Malignant neoplasm of prostate: Secondary | ICD-10-CM

## 2018-12-14 NOTE — Telephone Encounter (Signed)
Noted, labs ordered and PSA included.

## 2018-12-14 NOTE — Telephone Encounter (Signed)
Patient has a lab appointment on 12/25/18.  Patient wanted to make sure that his PSA is checked.

## 2018-12-18 ENCOUNTER — Other Ambulatory Visit: Payer: Medicare Other

## 2018-12-25 ENCOUNTER — Other Ambulatory Visit (INDEPENDENT_AMBULATORY_CARE_PROVIDER_SITE_OTHER): Payer: Medicare Other

## 2018-12-25 ENCOUNTER — Ambulatory Visit: Payer: Medicare Other

## 2018-12-25 ENCOUNTER — Ambulatory Visit (INDEPENDENT_AMBULATORY_CARE_PROVIDER_SITE_OTHER): Payer: Medicare Other

## 2018-12-25 DIAGNOSIS — Z0000 Encounter for general adult medical examination without abnormal findings: Secondary | ICD-10-CM | POA: Diagnosis not present

## 2018-12-25 DIAGNOSIS — I10 Essential (primary) hypertension: Secondary | ICD-10-CM

## 2018-12-25 DIAGNOSIS — C61 Malignant neoplasm of prostate: Secondary | ICD-10-CM

## 2018-12-25 DIAGNOSIS — E785 Hyperlipidemia, unspecified: Secondary | ICD-10-CM | POA: Diagnosis not present

## 2018-12-25 DIAGNOSIS — R7303 Prediabetes: Secondary | ICD-10-CM | POA: Diagnosis not present

## 2018-12-25 LAB — HEMOGLOBIN A1C: Hgb A1c MFr Bld: 6.2 % (ref 4.6–6.5)

## 2018-12-25 LAB — LIPID PANEL
Cholesterol: 127 mg/dL (ref 0–200)
HDL: 33.6 mg/dL — ABNORMAL LOW (ref >39.00)
LDL Cholesterol: 68 mg/dL (ref 0–99)
NonHDL: 93
Total CHOL/HDL Ratio: 4
Triglycerides: 125 mg/dL (ref 0.0–149.0)
VLDL: 25 mg/dL (ref 0.0–40.0)

## 2018-12-25 LAB — COMPREHENSIVE METABOLIC PANEL
ALT: 15 U/L (ref 0–53)
AST: 20 U/L (ref 0–37)
Albumin: 4.3 g/dL (ref 3.5–5.2)
Alkaline Phosphatase: 81 U/L (ref 39–117)
BUN: 19 mg/dL (ref 6–23)
CO2: 30 mEq/L (ref 19–32)
Calcium: 9.6 mg/dL (ref 8.4–10.5)
Chloride: 102 mEq/L (ref 96–112)
Creatinine, Ser: 1.26 mg/dL (ref 0.40–1.50)
GFR: 56.38 mL/min — ABNORMAL LOW (ref >60.00)
Glucose, Bld: 131 mg/dL — ABNORMAL HIGH (ref 70–99)
Potassium: 3.7 mEq/L (ref 3.5–5.1)
Sodium: 140 mEq/L (ref 135–145)
Total Bilirubin: 0.5 mg/dL (ref 0.2–1.2)
Total Protein: 6.9 g/dL (ref 6.0–8.3)

## 2018-12-25 LAB — CBC
HCT: 40.3 % (ref 39.0–52.0)
Hemoglobin: 13.4 g/dL (ref 13.0–17.0)
MCHC: 33.3 g/dL (ref 30.0–36.0)
MCV: 85.5 fl (ref 78.0–100.0)
Platelets: 256 10*3/uL (ref 150.0–400.0)
RBC: 4.71 Mil/uL (ref 4.22–5.81)
RDW: 14.4 % (ref 11.5–15.5)
WBC: 10.3 10*3/uL (ref 4.0–10.5)

## 2018-12-25 LAB — PSA: PSA: 22.86 ng/mL — ABNORMAL HIGH (ref 0.10–4.00)

## 2018-12-25 NOTE — Patient Instructions (Signed)
Jerry Wong , Thank you for taking time to come for your Medicare Wellness Visit. I appreciate your ongoing commitment to your health goals. Please review the following plan we discussed and let me know if I can assist you in the future.   Screening recommendations/referrals: Colonoscopy: up to date, completed 01/27/2017 Recommended yearly ophthalmology/optometry visit for glaucoma screening and checkup Recommended yearly dental visit for hygiene and checkup  Vaccinations: Influenza vaccine: will get at next office visit  Pneumococcal vaccine: Completed series Tdap vaccine: up to date, completed 12/29/2017 Shingles vaccine: will discuss with provider    Advanced directives: Please bring a copy of your POA (Power of [place]) and/or Living Will to your next appointment.   Conditions/risks identified: hypertension, hyperlipidemia  Next appointment: 01/01/2019 @ 3:20 pm   Preventive Care 71 Years and Older, Male Preventive care refers to lifestyle choices and visits with your health care provider that can promote health and wellness. What does preventive care include?  A yearly physical exam. This is also called an annual well check.  Dental exams once or twice a year.  Routine eye exams. Ask your health care provider how often you should have your eyes checked.  Personal lifestyle choices, including:  Daily care of your teeth and gums.  Regular physical activity.  Eating a healthy diet.  Avoiding tobacco and drug use.  Limiting alcohol use.  Practicing safe sex.  Taking low doses of aspirin every day.  Taking vitamin and mineral supplements as recommended by your health care provider. What happens during an annual well check? The services and screenings done by your health care provider during your annual well check will depend on your age, overall health, lifestyle risk factors, and family history of disease. Counseling  Your health care provider may ask you questions  about your:  Alcohol use.  Tobacco use.  Drug use.  Emotional well-being.  Home and relationship well-being.  Sexual activity.  Eating habits.  History of falls.  Memory and ability to understand (cognition).  Work and work Statistician. Screening  You may have the following tests or measurements:  Height, weight, and BMI.  Blood pressure.  Lipid and cholesterol levels. These may be checked every 5 years, or more frequently if you are over 28 years old.  Skin check.  Lung cancer screening. You may have this screening every year starting at age 43 if you have a 30-pack-year history of smoking and currently smoke or have quit within the past 15 years.  Fecal occult blood test (FOBT) of the stool. You may have this test every year starting at age 6.  Flexible sigmoidoscopy or colonoscopy. You may have a sigmoidoscopy every 5 years or a colonoscopy every 10 years starting at age 61.  Prostate cancer screening. Recommendations will vary depending on your family history and other risks.  Hepatitis C blood test.  Hepatitis B blood test.  Sexually transmitted disease (STD) testing.  Diabetes screening. This is done by checking your blood sugar (glucose) after you have not eaten for a while (fasting). You may have this done every 1-3 years.  Abdominal aortic aneurysm (AAA) screening. You may need this if you are a current or former smoker.  Osteoporosis. You may be screened starting at age 100 if you are at high risk. Talk with your health care provider about your test results, treatment options, and if necessary, the need for more tests. Vaccines  Your health care provider may recommend certain vaccines, such as:  Influenza vaccine. This  is recommended every year.  Tetanus, diphtheria, and acellular pertussis (Tdap, Td) vaccine. You may need a Td booster every 10 years.  Zoster vaccine. You may need this after age 10.  Pneumococcal 13-valent conjugate (PCV13)  vaccine. One dose is recommended after age 54.  Pneumococcal polysaccharide (PPSV23) vaccine. One dose is recommended after age 44. Talk to your health care provider about which screenings and vaccines you need and how often you need them. This information is not intended to replace advice given to you by your health care provider. Make sure you discuss any questions you have with your health care provider. Document Released: 03/10/2015 Document Revised: 11/01/2015 Document Reviewed: 12/13/2014 Elsevier Interactive Patient Education  2017 [place] Prevention in the Home Falls can cause injuries. They can happen to people of all ages. There are many things you can do to make your home safe and to help prevent falls. What can I do on the outside of my home?  Regularly fix the edges of walkways and driveways and fix any cracks.  Remove anything that might make you trip as you walk through a door, such as a raised step or threshold.  Trim any bushes or trees on the path to your home.  Use bright outdoor lighting.  Clear any walking paths of anything that might make someone trip, such as rocks or tools.  Regularly check to see if handrails are loose or broken. Make sure that both sides of any steps have handrails.  Any raised decks and porches should have guardrails on the edges.  Have any leaves, snow, or ice cleared regularly.  Use sand or salt on walking paths during winter.  Clean up any spills in your garage right away. This includes oil or grease spills. What can I do in the bathroom?  Use night lights.  Install grab bars by the toilet and in the tub and shower. Do not use towel bars as grab bars.  Use non-skid mats or decals in the tub or shower.  If you need to sit down in the shower, use a plastic, non-slip stool.  Keep the floor dry. Clean up any water that spills on the floor as soon as it happens.  Remove soap buildup in the tub or shower regularly.   Attach bath mats securely with double-sided non-slip rug tape.  Do not have throw rugs and other things on the floor that can make you trip. What can I do in the bedroom?  Use night lights.  Make sure that you have a light by your bed that is easy to reach.  Do not use any sheets or blankets that are too big for your bed. They should not hang down onto the floor.  Have a firm chair that has side arms. You can use this for support while you get dressed.  Do not have throw rugs and other things on the floor that can make you trip. What can I do in the kitchen?  Clean up any spills right away.  Avoid walking on wet floors.  Keep items that you use a lot in easy-to-reach places.  If you need to reach something above you, use a strong step stool that has a grab bar.  Keep electrical cords out of the way.  Do not use floor polish or wax that makes floors slippery. If you must use wax, use non-skid floor wax.  Do not have throw rugs and other things on the floor that can make you  trip. What can I do with my stairs?  Do not leave any items on the stairs.  Make sure that there are handrails on both sides of the stairs and use them. Fix handrails that are broken or loose. Make sure that handrails are as long as the stairways.  Check any carpeting to make sure that it is firmly attached to the stairs. Fix any carpet that is loose or worn.  Avoid having throw rugs at the top or bottom of the stairs. If you do have throw rugs, attach them to the floor with carpet tape.  Make sure that you have a light switch at the top of the stairs and the bottom of the stairs. If you do not have them, ask someone to add them for you. What else can I do to help prevent falls?  Wear shoes that:  Do not have high heels.  Have rubber bottoms.  Are comfortable and fit you well.  Are closed at the toe. Do not wear sandals.  If you use a stepladder:  Make sure that it is fully opened. Do not climb  a closed stepladder.  Make sure that both sides of the stepladder are locked into place.  Ask someone to hold it for you, if possible.  Clearly mark and make sure that you can see:  Any grab bars or handrails.  First and last steps.  Where the edge of each step is.  Use tools that help you move around (mobility aids) if they are needed. These include:  Canes.  Walkers.  Scooters.  Crutches.  Turn on the lights when you go into a dark area. Replace any light bulbs as soon as they burn out.  Set up your furniture so you have a clear path. Avoid moving your furniture around.  If any of your floors are uneven, fix them.  If there are any pets around you, be aware of where they are.  Review your medicines with your doctor. Some medicines can make you feel dizzy. This can increase your chance of falling. Ask your doctor what other things that you can do to help prevent falls. This information is not intended to replace advice given to you by your health care provider. Make sure you discuss any questions you have with your health care provider. Document Released: 12/08/2008 Document Revised: 07/20/2015 Document Reviewed: 03/18/2014 Elsevier Interactive Patient Education  2017 [employer].

## 2018-12-25 NOTE — Progress Notes (Signed)
Subjective:   Jacobo Moncrief is a 73 y.o. male who presents for Medicare Annual/Subsequent preventive examination.  Review of Systems:    This visit is being conducted through telemedicine via telephone at the nurse health advisor's home address due to the COVID-19 pandemic. This patient has given me verbal consent via doximity to conduct this visit, patient states they are participating from their home address. Patient and myself are on the telephone call. There is no referral for this visit. Some vital signs may be absent or patient reported.    Patient identification: identified by name, DOB, and current address   Cardiac Risk Factors include: advanced age (>31men, >29 women);hypertension;dyslipidemia;male gender     Objective:    Vitals: There were no vitals taken for this visit.  There is no height or weight on file to calculate BMI.  Advanced Directives 12/25/2018 12/22/2017 12/06/2016  Does Patient Have a Medical Advance Directive? Yes Yes Yes  Type of Paramedic of [place];Living will [place];Living will [place];Living will  Copy of [place] in Chart? No - copy requested No - copy requested No - copy requested    Tobacco Social History   Tobacco Use  Smoking Status Former Smoker  Smokeless Tobacco Never Used  Tobacco Comment   quit 35-40 years ago per pt     Counseling given: Not Answered Comment: quit 35-40 years ago per pt   Clinical Intake:  Pre-visit preparation completed: Yes  Pain : No/denies pain     Nutritional Risks: None Diabetes: No  How often do you need to have someone help you when you read instructions, pamphlets, or other written materials from your doctor or pharmacy?: 1 - Never What is the last grade level you completed in school?: 12th  Interpreter Needed?: No  Information entered by :: CJohnson, LPN  Past Medical History:  Diagnosis Date  .  Arthritis   . Cataract   . Chickenpox   . Chronic headaches   . Chronic kidney disease   . Chronic neck and back pain   . History of kidney stones   . Hyperlipidemia   . Hypertension   . Insomnia   . Neuromuscular disorder ([place])   . Prostate cancer [facility])    Past Surgical History:  Procedure Laterality Date  . BACK SURGERY  1974  . NECK SURGERY  2002  . PROSTATECTOMY    . TONSILLECTOMY  1958  . TRIGGER FINGER RELEASE Left 11/20/2017   left thumb/Dr. Apolonio Schneiders   Family History  Problem Relation Age of Onset  . Arthritis Mother   . Lung cancer Mother   . Arthritis Father   . Lung cancer Father   . Hyperlipidemia Father   . Hypertension Father   . Stroke Father   . Prostate cancer Brother   . Stomach cancer Maternal Grandmother   . Colon cancer Neg Hx   . Esophageal cancer Neg Hx   . Pancreatic cancer Neg Hx   . Rectal cancer Neg Hx    Social History   Socioeconomic History  . Marital status: Married    Spouse name: [name]  . Number of children: 3  . Years of education: 35  . Highest education level: Not on file  Occupational History  . Not on file  Social Needs  . Financial resource strain: Not hard at all  . Food insecurity    Worry: Never true    Inability: Never true  .  Transportation needs    Medical: No    Non-medical: No  Tobacco Use  . Smoking status: Former Research scientist (life sciences)  . Smokeless tobacco: Never Used  . Tobacco comment: quit 35-40 years ago per pt  Substance and Sexual Activity  . Alcohol use: Not Currently  . Drug use: No  . Sexual activity: Not Currently  Lifestyle  . Physical activity    Days per week: 0 days    Minutes per session: 0 min  . Stress: Not at all  Relationships  . Social Herbalist on phone: Not on file    Gets together: Not on file    Attends religious service: Not on file    Active member of club or organization: Not on file    Attends meetings of clubs or organizations: Not on file    Relationship status: Not on  file  Other Topics Concern  . Not on file  Social History Narrative   Patient is married [name]) and lives at home with his wife and son.   Patient has three children.   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   Patient has a high school education.   Patient is right-handed.   Patient drinks one cup of coffee every morning.    Outpatient Encounter Medications as of 12/25/2018  Medication Sig  . amitriptyline (ELAVIL) 75 MG tablet Take 1 tablet (75 mg total) by mouth at bedtime.  Marland Kitchen amLODipine (NORVASC) 10 MG tablet TAKE 1 TABLET BY MOUTH EVERY DAY  . aspirin EC 81 MG tablet Take 81 mg by mouth daily.   Marland Kitchen atorvastatin (LIPITOR) 20 MG tablet TAKE 1 TABLET BY MOUTH EVERY DAY  . hydrochlorothiazide (HYDRODIURIL) 25 MG tablet TAKE 1 TABLET BY MOUTH EVERY DAY FOR BLOOD PRESSURE  . LYRICA 100 MG capsule TAKE 1 CAPSULE BY MOUTH TWICE A DAY AS DIRECTED  . Omega-3 Fatty Acids ([employer]S FISH OIL) 1000 MG CAPS TAKE 1 CAPSULE BY MOUTH EVERY DAY WITH A MEAL  . oxyCODONE-acetaminophen (PERCOCET) 10-325 MG per tablet Take 1 tablet by mouth as needed for pain.   . simvastatin (ZOCOR) 40 MG tablet TAKE 1 TABLET BY MOUTH EVERY EVENING  . tiZANidine (ZANAFLEX) 4 MG tablet TAKE 1 TABLET BY MOUTH TWICE A DAY  . zolpidem (AMBIEN) 10 MG tablet Take 1 tablet (10 mg total) by mouth at bedtime as needed.   No facility-administered encounter medications on file as of 12/25/2018.     Activities of Daily Living In your present state of health, do you have any difficulty performing the following activities: 12/25/2018  Hearing? N  Vision? N  Difficulty concentrating or making decisions? N  Walking or climbing stairs? N  Dressing or bathing? N  Doing errands, shopping? N  Preparing Food and eating ? N  Using the Toilet? N  In the past six months, have you accidently leaked urine? N  Do you have problems with loss of bowel control? N  Managing your Medications? N  Managing your Finances? N   Housekeeping or managing your Housekeeping? N  Some recent data might be hidden    Patient Care Team: Pleas Koch, NP as PCP - General (Internal Medicine)   Assessment:   This is a routine wellness examination for [place].  Exercise Activities and Dietary recommendations Current Exercise Habits: The patient does not participate in regular exercise at present, Exercise limited by: orthopedic condition(s)(spinal cord condition and patient falls a lot)  Goals    .  Increase water intake     Starting 12/22/2017, I will continue to drink at least 8 glasses of water daily.     . Patient Stated     12/25/2018, I will maintain and continue medications as prescribed.        Fall Risk Fall Risk  12/25/2018 12/22/2017 12/06/2016  Falls in the past year? 1 Yes Yes  Number falls in past yr: 1 2 or more 2 or more  Injury with Fall? 0 No No  Risk Factor Category  - High Fall Risk High Fall Risk  Risk for fall due to : History of fall(s);Medication side effect;Impaired balance/gait;Impaired mobility Impaired mobility;Impaired balance/gait;History of fall(s) Other (Comment)  Risk for fall due to: Comment - - numbness in feet  Follow up Falls evaluation completed;Falls prevention discussed - -   Is the patient's home free of loose throw rugs in walkways, pet beds, electrical cords, etc?   yes      Grab bars in the bathroom? no      Handrails on the stairs?   no      Adequate lighting?   yes  Timed Get Up and Go Performed: N/A  Depression Screen PHQ 2/9 Scores 12/25/2018 12/22/2017 12/06/2016  PHQ - 2 Score 0 0 0  PHQ- 9 Score 0 0 0    Cognitive Function MMSE - Mini Mental State Exam 12/25/2018 12/22/2017 12/06/2016  Orientation to time 5 5 5   Orientation to Place 5 5 5   Registration 3 3 3   Attention/ Calculation 5 0 0  Recall 3 3 2   Recall-comments - - unable to recall 1 of 3 words  Language- name 2 objects - 0 0  Language- repeat 1 1 1   Language- follow 3 step command - 3 2   Language- follow 3 step command-comments - - unable to complete 1 step of 3 step command  Language- read & follow direction - 0 0  Write a sentence - 0 0  Copy design - 0 0  Total score - 20 18  Mini Cog  Mini-Cog screen was completed. Maximum score is 22. A value of 0 denotes this part of the MMSE was not completed or the patient failed this part of the Mini-Cog screening.       Immunization History  Administered Date(s) Administered  . Influenza, High Dose Seasonal PF 12/06/2016, 12/22/2017  . Influenza-Unspecified 12/21/2017  . Pneumococcal Conjugate-13 02/28/2014  . Pneumococcal Polysaccharide-23 12/06/2016  . Td 12/29/2017  . Tdap 02/09/2007    Qualifies for Shingles Vaccine? Yes  Screening Tests Health Maintenance  Topic Date Due  . INFLUENZA VACCINE  09/26/2018  . COLONOSCOPY  01/28/2027  . TETANUS/TDAP  12/30/2027  . Hepatitis C Screening  Completed  . PNA vac Low Risk Adult  Completed   Cancer Screenings: Lung: Low Dose CT Chest recommended if Age 83-80 years, 30 pack-year currently smoking OR have quit w/in 15years. Patient does not qualify. Colorectal: completed 01/27/2017  Additional Screenings:  Hepatitis C Screening: 12/06/2016      Plan:   Patient will maintain and continue medications as prescribed.  I have personally reviewed and noted the following in the patient's chart:   . Medical and social history . Use of alcohol, tobacco or illicit drugs  . Current medications and supplements . Functional ability and status . Nutritional status . Physical activity . Advanced directives . List of other physicians . Hospitalizations, surgeries, and ER visits in previous 12 months . Vitals . Screenings to include  cognitive, depression, and falls . Referrals and appointments  In addition, I have reviewed and discussed with patient certain preventive protocols, quality metrics, and best practice recommendations. A written personalized care plan for  preventive services as well as general preventive health recommendations were provided to patient.     Jakie, Debow, LPN  [id number]

## 2018-12-25 NOTE — Progress Notes (Signed)
PCP notes:  Health Maintenance: needs flu vaccine, wants to get Shingrix vaccine if available    Abnormal Screenings: none    Patient concerns: Elevated blood pressure, last reading 2 days ago was 138/88.    Nurse concerns: none    Next PCP appt.: 01/01/2019 @ 3:20 pm

## 2018-12-28 ENCOUNTER — Other Ambulatory Visit: Payer: Medicare Other

## 2019-01-01 ENCOUNTER — Encounter: Payer: Medicare Other | Admitting: Primary Care

## 2019-01-15 ENCOUNTER — Encounter: Payer: Self-pay | Admitting: Primary Care

## 2019-01-15 ENCOUNTER — Ambulatory Visit (INDEPENDENT_AMBULATORY_CARE_PROVIDER_SITE_OTHER): Payer: Medicare Other | Admitting: Primary Care

## 2019-01-15 ENCOUNTER — Other Ambulatory Visit: Payer: Self-pay

## 2019-01-15 VITALS — BP 130/82 | HR 90 | Temp 97.3°F | Ht 69.0 in | Wt 212.2 lb

## 2019-01-15 DIAGNOSIS — G8929 Other chronic pain: Secondary | ICD-10-CM | POA: Diagnosis not present

## 2019-01-15 DIAGNOSIS — R7303 Prediabetes: Secondary | ICD-10-CM

## 2019-01-15 DIAGNOSIS — I10 Essential (primary) hypertension: Secondary | ICD-10-CM

## 2019-01-15 DIAGNOSIS — R519 Headache, unspecified: Secondary | ICD-10-CM

## 2019-01-15 DIAGNOSIS — C61 Malignant neoplasm of prostate: Secondary | ICD-10-CM

## 2019-01-15 DIAGNOSIS — G44229 Chronic tension-type headache, not intractable: Secondary | ICD-10-CM | POA: Diagnosis not present

## 2019-01-15 DIAGNOSIS — Z0000 Encounter for general adult medical examination without abnormal findings: Secondary | ICD-10-CM

## 2019-01-15 DIAGNOSIS — E785 Hyperlipidemia, unspecified: Secondary | ICD-10-CM

## 2019-01-15 DIAGNOSIS — M549 Dorsalgia, unspecified: Secondary | ICD-10-CM

## 2019-01-15 DIAGNOSIS — Z23 Encounter for immunization: Secondary | ICD-10-CM

## 2019-01-15 DIAGNOSIS — G4701 Insomnia due to medical condition: Secondary | ICD-10-CM

## 2019-01-15 DIAGNOSIS — N289 Disorder of kidney and ureter, unspecified: Secondary | ICD-10-CM

## 2019-01-15 DIAGNOSIS — M542 Cervicalgia: Secondary | ICD-10-CM

## 2019-01-15 MED ORDER — ZOSTER VAC RECOMB ADJUVANTED 50 MCG/0.5ML IM SUSR: 0.5000 mL | Freq: Once | INTRAMUSCULAR | 1 refills | Status: AC

## 2019-01-15 NOTE — Assessment & Plan Note (Signed)
Recent LDL stable. Continue atorvastatin.

## 2019-01-15 NOTE — Patient Instructions (Signed)
Take the Shingles vaccine to your pharmacy.  Start exercising. You should be getting 150 minutes of exercise weekly.  It's important to improve your diet by reducing consumption of fast food, fried food, processed snack foods, sugary drinks. Increase consumption of fresh vegetables and fruits, whole grains, water.  Ensure you are drinking 64 ounces of water daily.  Call your Urologist for an appointment, your PSA is too high.  It was a pleasure to see you today!

## 2019-01-15 NOTE — Assessment & Plan Note (Signed)
Decrease in function since last year. Today he endorses that he was once following with Nephrology. Repeat BMP today.

## 2019-01-15 NOTE — Assessment & Plan Note (Signed)
Recent A1C of 6.2. Chronic for years, continue to monitor.

## 2019-01-15 NOTE — Assessment & Plan Note (Signed)
Doing well on Ambien for which he uses 2-3 times weekly.  Continue same.

## 2019-01-15 NOTE — Assessment & Plan Note (Signed)
Influenza vaccination provided today. Rx for Shingrix provided. Other immunizations UTD. PSA UTD, following with Urology and will make another appointment. Colonoscopy UTD, due in 2028. Encouraged a healthy diet with regular exercise. Exam today unremarkable. Labs reviewed.

## 2019-01-15 NOTE — Assessment & Plan Note (Signed)
Following with neurology. Continue Tizanidine and amitriptyline.

## 2019-01-15 NOTE — Assessment & Plan Note (Signed)
Recent increase in PSA of 22, strongly advised he schedule a visit with is Urologist, he verbalized understanding.

## 2019-01-15 NOTE — Assessment & Plan Note (Signed)
Doing well on amitriptyline and tizanidine. Following with neurology.  Continue current regimen.

## 2019-01-15 NOTE — Progress Notes (Signed)
Subjective:    Patient ID: Jerry Wong, male    DOB: [birth year], 73 y.o.   MRN: [id number]  HPI  Jerry Wong is a 73 year old male who presents today for complete physical.  Immunizations: -Tetanus: Completed in 2019 -Influenza: Due today -Shingles: Never completed -Pneumonia: Completed Prevar in 2016, Pneumovax in 2018  Diet: He endorses a fair diet. Exercise: He is active at home, not exercising regularly due to back pain.  Eye exam: Completed years ago Dental exam: Completes annually  Colonoscopy: Completed in 2018, due in 2028 PSA: 22 in 2020, 12 in 2019 Hep C Screen: Negative in 2018  BP Readings from Last 3 Encounters:  01/15/19 130/82  10/26/18 138/78  12/29/17 126/76      Review of Systems  Constitutional: Negative for unexpected weight change.  HENT: Negative for rhinorrhea.   Respiratory: Negative for cough and shortness of breath.   Cardiovascular: Negative for chest pain.  Gastrointestinal: Negative for constipation and diarrhea.  Genitourinary: Negative for difficulty urinating.  Musculoskeletal: Positive for arthralgias and back pain.  Skin: Negative for rash.  Allergic/Immunologic: Negative for environmental allergies.  Neurological: Negative for dizziness, numbness and headaches.  Psychiatric/Behavioral: The patient is not nervous/anxious.        Past Medical History:  Diagnosis Date  . Arthritis   . Cataract   . Chickenpox   . Chronic headaches   . Chronic kidney disease   . Chronic neck and back pain   . History of kidney stones   . Hyperlipidemia   . Hypertension   . Insomnia   . Neuromuscular disorder ([place])   . Prostate cancer [facility])      Social History   Socioeconomic History  . Marital status: Married    Spouse name: [name]  . Number of children: 3  . Years of education: 109  . Highest education level: Not on file  Occupational History  . Not on file  Social Needs  . Financial resource strain: Not hard at all  . Food  insecurity    Worry: Never true    Inability: Never true  . Transportation needs    Medical: No    Non-medical: No  Tobacco Use  . Smoking status: Former Research scientist (life sciences)  . Smokeless tobacco: Never Used  . Tobacco comment: quit 35-40 years ago per pt  Substance and Sexual Activity  . Alcohol use: Not Currently  . Drug use: No  . Sexual activity: Not Currently  Lifestyle  . Physical activity    Days per week: 0 days    Minutes per session: 0 min  . Stress: Not at all  Relationships  . Social Herbalist on phone: Not on file    Gets together: Not on file    Attends religious service: Not on file    Active member of club or organization: Not on file    Attends meetings of clubs or organizations: Not on file    Relationship status: Not on file  . Intimate partner violence    Fear of current or ex partner: No    Emotionally abused: No    Physically abused: No    Forced sexual activity: No  Other Topics Concern  . Not on file  Social History Narrative   Patient is married [name]) and lives at home with his wife and son.   Patient has three children.   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   Patient has a high  school education.   Patient is right-handed.   Patient drinks one cup of coffee every morning.    Past Surgical History:  Procedure Laterality Date  . BACK SURGERY  1974  . NECK SURGERY  2002  . PROSTATECTOMY    . TONSILLECTOMY  1958  . TRIGGER FINGER RELEASE Left 11/20/2017   left thumb/Dr. Apolonio Schneiders    Family History  Problem Relation Age of Onset  . Arthritis Mother   . Lung cancer Mother   . Arthritis Father   . Lung cancer Father   . Hyperlipidemia Father   . Hypertension Father   . Stroke Father   . Prostate cancer Brother   . Stomach cancer Maternal Grandmother   . Colon cancer Neg Hx   . Esophageal cancer Neg Hx   . Pancreatic cancer Neg Hx   . Rectal cancer Neg Hx     No Known Allergies  Current Outpatient Medications on File  Prior to Visit  Medication Sig Dispense Refill  . amitriptyline (ELAVIL) 75 MG tablet Take 1 tablet (75 mg total) by mouth at bedtime. 90 tablet 3  . amLODipine (NORVASC) 10 MG tablet TAKE 1 TABLET BY MOUTH EVERY DAY 90 tablet 1  . aspirin EC 81 MG tablet Take 81 mg by mouth daily.     Marland Kitchen atorvastatin (LIPITOR) 20 MG tablet TAKE 1 TABLET BY MOUTH EVERY DAY 90 tablet 1  . hydrochlorothiazide (HYDRODIURIL) 25 MG tablet TAKE 1 TABLET BY MOUTH EVERY DAY FOR BLOOD PRESSURE 90 tablet 1  . LYRICA 100 MG capsule TAKE 1 CAPSULE BY MOUTH TWICE A DAY AS DIRECTED  2  . Omega-3 Fatty Acids ([employer]S FISH OIL) 1000 MG CAPS TAKE 1 CAPSULE BY MOUTH EVERY DAY WITH A MEAL 90 capsule 3  . oxyCODONE-acetaminophen (PERCOCET) 10-325 MG per tablet Take 1 tablet by mouth as needed for pain.     Marland Kitchen tiZANidine (ZANAFLEX) 4 MG tablet TAKE 1 TABLET BY MOUTH TWICE A DAY 180 tablet 3  . zolpidem (AMBIEN) 10 MG tablet Take 1 tablet (10 mg total) by mouth at bedtime as needed. 30 tablet 4   No current facility-administered medications on file prior to visit.     BP 130/82   Pulse 90   Temp (!) 97.3 F (36.3 C) (Temporal)   Ht 5\' 9"  (1.753 m)   Wt 212 lb 4 oz (96.3 kg)   SpO2 98%   BMI 31.34 kg/m    Objec tive:   Physical Exam  Constitutional: He is oriented to person, place, and time. He appears well-nourished.  HENT:  Right Ear: Tympanic membrane and ear canal normal.  Left Ear: Tympanic membrane and ear canal normal.  Mouth/Throat: Oropharynx is clear and moist.  Eyes: Pupils are equal, round, and reactive to light. EOM are normal.  Neck: Neck supple.  Cardiovascular: Normal rate and regular rhythm.  Respiratory: Effort normal and breath sounds normal.  GI: Soft. Bowel sounds are normal. There is no abdominal tenderness.  Musculoskeletal: Normal range of motion.     Comments: Generalized slight decrease in ROM to lower back, chronic. Ambulates well.   Neurological: He is alert and oriented to person, place, and  time. No cranial nerve deficit.  Reflex Scores:      Patellar reflexes are 2+ on the right side and 2+ on the left side. Skin: Skin is warm and dry.  Psychiatric: He has a normal mood and affect.           Assessment &  Plan:

## 2019-01-15 NOTE — Assessment & Plan Note (Signed)
Following with pain management, continue Percocet and Lyrica.

## 2019-01-15 NOTE — Assessment & Plan Note (Signed)
Stable in the office today, continue Amlodipine and HCTZ. CMP reviewed.

## 2019-01-16 LAB — BASIC METABOLIC PANEL
BUN/Creatinine Ratio: 20 (calc) (ref 6–22)
BUN: 24 mg/dL (ref 7–25)
CO2: 26 mmol/L (ref 20–32)
Calcium: 10.2 mg/dL (ref 8.6–10.3)
Chloride: 104 mmol/L (ref 98–110)
Creat: 1.21 mg/dL — ABNORMAL HIGH (ref 0.70–1.18)
Glucose, Bld: 117 mg/dL — ABNORMAL HIGH (ref 65–99)
Potassium: 3.8 mmol/L (ref 3.5–5.3)
Sodium: 142 mmol/L (ref 135–146)

## 2019-02-15 ENCOUNTER — Other Ambulatory Visit: Payer: Self-pay | Admitting: Primary Care

## 2019-02-15 DIAGNOSIS — I10 Essential (primary) hypertension: Secondary | ICD-10-CM

## 2019-04-13 ENCOUNTER — Other Ambulatory Visit: Payer: Self-pay | Admitting: Primary Care

## 2019-04-13 DIAGNOSIS — I10 Essential (primary) hypertension: Secondary | ICD-10-CM

## 2019-04-29 ENCOUNTER — Ambulatory Visit: Payer: Medicare Other | Attending: Internal Medicine

## 2019-04-29 DIAGNOSIS — Z23 Encounter for immunization: Secondary | ICD-10-CM | POA: Insufficient documentation

## 2019-04-29 NOTE — Progress Notes (Signed)
   Covid-19 Vaccination Clinic  Name:  Jerry Wong    MRN: [id number] DOB: 09-16-1945  04/29/2019  [name] was observed post Covid-19 immunization for 15 minutes without incident. He was provided with Vaccine Information Sheet and instruction to access the V-Safe system.   [name] was instructed to call 911 with any severe reactions post vaccine: Marland Kitchen Difficulty breathing  . Swelling of face and throat  . A fast heartbeat  . A bad rash all over body  . Dizziness and weakness

## 2019-05-17 DIAGNOSIS — M47816 Spondylosis without myelopathy or radiculopathy, lumbar region: Secondary | ICD-10-CM | POA: Diagnosis not present

## 2019-05-17 DIAGNOSIS — G894 Chronic pain syndrome: Secondary | ICD-10-CM | POA: Diagnosis not present

## 2019-05-17 DIAGNOSIS — M47812 Spondylosis without myelopathy or radiculopathy, cervical region: Secondary | ICD-10-CM | POA: Diagnosis not present

## 2019-05-17 DIAGNOSIS — Z79891 Long term (current) use of opiate analgesic: Secondary | ICD-10-CM | POA: Diagnosis not present

## 2019-05-20 ENCOUNTER — Ambulatory Visit: Payer: Medicare Other | Attending: Internal Medicine

## 2019-05-20 DIAGNOSIS — Z23 Encounter for immunization: Secondary | ICD-10-CM

## 2019-05-20 NOTE — Progress Notes (Signed)
   Covid-19 Vaccination Clinic  Name:  [name]    MRN: [id number] DOB: 1945/08/16  05/20/2019  Mr. Ackert was observed post Covid-19 immunization for 15 minutes without incident. He was provided with Vaccine Information Sheet and instruction to access the V-Safe system.   Mr. Hanners was instructed to call 911 with any severe reactions post vaccine: Marland Kitchen Difficulty breathing  . Swelling of face and throat  . A fast heartbeat  . A bad rash all over body  . Dizziness and weakness   Immunizations Administered    Name Date Dose VIS Date Route   Pfizer COVID-19 Vaccine 05/20/2019 11:23 AM 0.3 mL 02/05/2019 Intramuscular   Manufacturer: [place]   Lot: [id number]   [place]: [id number]

## 2019-05-26 ENCOUNTER — Other Ambulatory Visit: Payer: Self-pay | Admitting: Primary Care

## 2019-05-26 DIAGNOSIS — E785 Hyperlipidemia, unspecified: Secondary | ICD-10-CM

## 2019-07-12 DIAGNOSIS — M47816 Spondylosis without myelopathy or radiculopathy, lumbar region: Secondary | ICD-10-CM | POA: Diagnosis not present

## 2019-07-12 DIAGNOSIS — M47812 Spondylosis without myelopathy or radiculopathy, cervical region: Secondary | ICD-10-CM | POA: Diagnosis not present

## 2019-07-12 DIAGNOSIS — G894 Chronic pain syndrome: Secondary | ICD-10-CM | POA: Diagnosis not present

## 2019-07-12 DIAGNOSIS — Z79891 Long term (current) use of opiate analgesic: Secondary | ICD-10-CM | POA: Diagnosis not present

## 2019-08-12 ENCOUNTER — Encounter: Payer: Self-pay | Admitting: Family Medicine

## 2019-08-12 ENCOUNTER — Telehealth (INDEPENDENT_AMBULATORY_CARE_PROVIDER_SITE_OTHER): Payer: Medicare Other | Admitting: Family Medicine

## 2019-08-12 ENCOUNTER — Other Ambulatory Visit: Payer: Self-pay

## 2019-08-12 VITALS — Ht 69.0 in

## 2019-08-12 DIAGNOSIS — R05 Cough: Secondary | ICD-10-CM

## 2019-08-12 DIAGNOSIS — Z20822 Contact with and (suspected) exposure to covid-19: Secondary | ICD-10-CM | POA: Diagnosis not present

## 2019-08-12 DIAGNOSIS — R5081 Fever presenting with conditions classified elsewhere: Secondary | ICD-10-CM | POA: Diagnosis not present

## 2019-08-12 DIAGNOSIS — R059 Cough, unspecified: Secondary | ICD-10-CM

## 2019-08-12 MED ORDER — DOXYCYCLINE HYCLATE 100 MG PO TABS: 100.0000 mg | ORAL_TABLET | Freq: Two times a day (BID) | ORAL | 0 refills | Status: AC

## 2019-08-12 NOTE — Progress Notes (Signed)
     Alazne Quant T. [name] Edgecombe, MD Primary Care and Sports Medicine [facility] [place], [address] Phone: [phone]  FAX: [phone]  Jerry Wong - 74 y.o. male  MRN [id number]  Date of Birth: 04-01-1945  Visit Date: 08/12/2019  PCP: Pleas Koch, NP  Referred by: Pleas Koch, NP Chief Complaint  Patient presents with  . Fever  . Dizziness  . Wheezing   Virtual Visit via Video Note:  I connected with  Jerry Wong on 08/12/2019  3:40 PM EDT by a video enabled telemedicine application and verified that I am speaking with the correct person using two identifiers.   Location patient: home computer, tablet, or smartphone Location provider: work or home office Consent: Verbal consent directly obtained from [employer]. Persons participating in the virtual visit: patient, provider  I discussed the limitations of evaluation and management by telemedicine and the availability of in person appointments. The patient expressed understanding and agreed to proceed.  History of Present Illness:  Jerry Wong is a nice gentleman, and for the last few days he has had some fever up to a T-max of 101.  He is also been quite congested and coughing with some upper respiratory symptoms as well.  He does have some subjective wheezing, but he does not have any known pulmonary disease.  He quit smoking about 40 years ago or so.  He has not lost any taste or smell.  He is not having any diarrhea, he is keeping food and liquids down.  covid test  101  Doxy  Review of Systems as above: See pertinent positives and pertinent negatives per HPI No acute distress verbally   Observations/Objective/Exam:  An attempt was made to discern vital signs over the phone and per patient if applicable and possible.   General:    Alert, Oriented, appears well and in no acute distress  Pulmonary:     On inspection no signs of respiratory distress.  Psych /  Neurological:     Pleasant and cooperative.  Assessment and Plan:    ICD-10-CM   1. Cough  R05   2. Fever in other diseases  R50.81    Cough and fever up to 101.  Obtain COVID-19 testing.  Quarantine until this is finalized.  Given his fever I think it is probably reasonable place him on some antibiotics now.  He is breathing at a normal rate per his report to me.  I discussed the assessment and treatment plan with the patient. The patient was provided an opportunity to ask questions and all were answered. The patient agreed with the plan and demonstrated an understanding of the instructions.   The patient was advised to call back or seek an in-person evaluation if the symptoms worsen or if the condition fails to improve as anticipated.  Follow-up: prn unless noted otherwise below No follow-ups on file.  Meds ordered this encounter  Medications  . doxycycline (VIBRA-TABS) 100 MG tablet    Sig: Take 1 tablet (100 mg total) by mouth 2 (two) times daily for 10 days.    Dispense:  20 tablet    Refill:  0   No orders of the defined types were placed in this encounter.   Signed,  Maud Deed. Jancarlo Biermann, MD

## 2019-08-13 ENCOUNTER — Telehealth: Payer: Self-pay

## 2019-08-13 NOTE — Telephone Encounter (Signed)
Noted  

## 2019-08-13 NOTE — Telephone Encounter (Signed)
Pt had virtual with Dr Lorelei Pont on 08/12/19 and pt did have covid test at [place] but will take couple of days at least per pt to get results. pts niece passed away and pts wife who is self quarantining since lives with pt wants to go to funeral on 08/14/19. pts wife is not a pt at [facility] but has had both pfizer covid vaccines on 04/29/19 and 05/20/19.pts wife is not having any covid symptoms. Pt does still have today temp of 99.4, some wheeze with dry cough, and dizziness on and off. No SOB or CP. Gentry Fitz NP said should be OK for pts wife to go to funeral but if she develops any covid symptoms should not go to funeral. Pt voiced understanding and pts wife will wear a mask at funeral. I offered our condolences for their loss. FYI to Gentry Fitz NP.

## 2019-08-19 ENCOUNTER — Other Ambulatory Visit: Payer: Self-pay | Admitting: Primary Care

## 2019-08-19 DIAGNOSIS — I10 Essential (primary) hypertension: Secondary | ICD-10-CM

## 2019-08-23 DIAGNOSIS — N200 Calculus of kidney: Secondary | ICD-10-CM | POA: Diagnosis not present

## 2019-08-24 ENCOUNTER — Telehealth: Payer: Self-pay

## 2019-08-24 NOTE — Telephone Encounter (Signed)
Noted, agree to urgent care for chest x-ray given dyspnea.

## 2019-08-24 NOTE — Telephone Encounter (Signed)
Pt had virtual visit with Dr Lorelei Pont on 08/12/19;pt was given abx which pt has finished and pt is no better. Pt still has non prod cough, wheezing, and SOB when talking on phone now. Pt had scheduled virtual appt with Gentry Fitz NP on 08/25/19 but I spoke with pt and due to pts symptoms especially SOB while talking on phone pt is going to [facility] on [facility] for face to face visit and CXR if needed. Pt asked me to cancel virtual appt on 08/25/19; pt will cb with update after being seen at [facility] today. FYI to Gentry Fitz  NP.

## 2019-08-25 ENCOUNTER — Ambulatory Visit (INDEPENDENT_AMBULATORY_CARE_PROVIDER_SITE_OTHER): Payer: Medicare Other

## 2019-08-25 ENCOUNTER — Other Ambulatory Visit: Payer: Self-pay

## 2019-08-25 ENCOUNTER — Encounter (HOSPITAL_COMMUNITY): Payer: Self-pay

## 2019-08-25 ENCOUNTER — Ambulatory Visit (HOSPITAL_COMMUNITY)
Admission: EM | Admit: 2019-08-25 | Discharge: 2019-08-25 | Disposition: A | Discharge status: Home / Self Care | Payer: Medicare Other | Attending: Internal Medicine | Admitting: Internal Medicine

## 2019-08-25 ENCOUNTER — Telehealth: Payer: Medicare Other | Admitting: Primary Care

## 2019-08-25 DIAGNOSIS — R0602 Shortness of breath: Secondary | ICD-10-CM

## 2019-08-25 DIAGNOSIS — R509 Fever, unspecified: Secondary | ICD-10-CM | POA: Diagnosis not present

## 2019-08-25 DIAGNOSIS — R05 Cough: Secondary | ICD-10-CM

## 2019-08-25 DIAGNOSIS — J90 Pleural effusion, not elsewhere classified: Secondary | ICD-10-CM | POA: Diagnosis not present

## 2019-08-25 DIAGNOSIS — R918 Other nonspecific abnormal finding of lung field: Secondary | ICD-10-CM | POA: Diagnosis not present

## 2019-08-25 MED ORDER — LEVOFLOXACIN 500 MG PO TABS
500.0000 mg | ORAL_TABLET | Freq: Every day | ORAL | 0 refills | Status: DC
Start: 2019-08-25 — End: 2019-09-09

## 2019-08-25 MED ORDER — ALBUTEROL SULFATE HFA 108 (90 BASE) MCG/ACT IN AERS: 2.0000 | INHALATION_SPRAY | RESPIRATORY_TRACT | 0 refills | Status: AC | PRN

## 2019-08-25 MED ORDER — PREDNISONE 20 MG PO TABS: ORAL_TABLET | ORAL | 0 refills | Status: DC

## 2019-08-25 NOTE — Discharge Instructions (Addendum)
I have placed an order for a chest CT to check for the right lung mass we see on the chest xray. The radiologist believes it is suspicious for lung cancer. Please follow up with your family Dr a couple of days after done.  I am placing you on a different antibiotic called Levaquin to cover pneumonia and prednisone to see if this will help with the wheezing and breathing better

## 2019-08-25 NOTE — ED Provider Notes (Signed)
[place]    CSN: [id number] Arrival date & time: 08/25/19  1250      History   Chief Complaint Chief Complaint  Patient presents with  . Cough    HPI Jerry Wong is a 74 y.o. male. who presents with cough and fever x 2 weeks. He was placed on Doxy on 6/17, but he is not getting better. His cough is non productive and at times has heard a wheezing in his chest. Denies, HA, body aches, rhinitis, chest pains.     Past Medical History:  Diagnosis Date  . Arthritis   . Cataract   . Chickenpox   . Chronic headaches   . Chronic kidney disease   . Chronic neck and back pain   . History of kidney stones   . Hyperlipidemia   . Hypertension   . Insomnia   . Neuromuscular disorder ([place])   . Prostate cancer [facility])     Patient Active Problem List   Diagnosis Date Noted  . Decreased renal function 01/15/2019  . Chronic tension-type headache, not intractable 10/26/2018  . Edema of both ankles 10/26/2018  . Insomnia secondary to chronic pain 12/29/2017  . Prostate cancer ([place]) 12/10/2016  . Prediabetes 12/10/2016  . Preventative health care 12/10/2016  . Essential hypertension 06/03/2016  . Hyperlipidemia 06/03/2016  . Medication monitoring encounter 08/24/2013  . Chronic headaches 08/21/2012  . Chronic neck and back pain 08/21/2012    Past Surgical History:  Procedure Laterality Date  . BACK SURGERY  1974  . NECK SURGERY  2002  . PROSTATECTOMY    . TONSILLECTOMY  1958  . TRIGGER FINGER RELEASE Left 11/20/2017   left thumb/Dr. Apolonio Schneiders       Home Medications    Prior to Admission medications   Medication Sig Start Date End Date Taking? Authorizing Provider  albuterol (VENTOLIN HFA) 108 (90 Base) MCG/ACT inhaler Inhale 2 puffs into the lungs every 4 (four) hours as needed for wheezing or shortness of breath. 08/25/19   Rodriguez-Southworth, Sunday Spillers, PA-C  amitriptyline (ELAVIL) 75 MG tablet Take 1 tablet (75 mg total) by mouth at bedtime. 10/26/18    Dohmeier, Asencion Partridge, MD  amLODipine (NORVASC) 10 MG tablet TAKE 1 TABLET BY MOUTH EVERY DAY 04/13/19   Pleas Koch, NP  aspirin EC 81 MG tablet Take 81 mg by mouth daily.     [provider]  atorvastatin (LIPITOR) 20 MG tablet TAKE 1 TABLET BY MOUTH EVERY DAY 05/26/19   Pleas Koch, NP  hydrochlorothiazide (HYDRODIURIL) 25 MG tablet TAKE 1 TABLET BY MOUTH EVERY DAY FOR BLOOD PRESSURE 08/20/19   Pleas Koch, NP  levofloxacin (LEVAQUIN) 500 MG tablet Take 1 tablet (500 mg total) by mouth daily. 08/25/19   Rodriguez-Southworth, Sunday Spillers, PA-C  LYRICA 100 MG capsule TAKE 1 CAPSULE BY MOUTH TWICE A DAY AS DIRECTED 10/10/16   [provider]  Omega-3 Fatty Acids ([employer] FISH OIL) 1000 MG CAPS TAKE 1 CAPSULE BY MOUTH EVERY DAY WITH A MEAL 03/06/18   Pleas Koch, NP  oxyCODONE-acetaminophen (PERCOCET) 10-325 MG per tablet Take 1 tablet by mouth as needed for pain.  07/29/12   [provider]  predniSONE (DELTASONE) 20 MG tablet One bid x 3 days, then one qd x 3 08/25/19   Rodriguez-Southworth, Sunday Spillers, PA-C  tiZANidine (ZANAFLEX) 4 MG tablet TAKE 1 TABLET BY MOUTH TWICE A DAY 10/26/18   Dohmeier, Asencion Partridge, MD  zolpidem (AMBIEN) 10 MG tablet Take 1 tablet (10 mg total)  by mouth at bedtime as needed. 10/26/18   Dohmeier, Asencion Partridge, MD    Family History Family History  Problem Relation Age of Onset  . Arthritis Mother   . Lung cancer Mother   . Arthritis Father   . Lung cancer Father   . Hyperlipidemia Father   . Hypertension Father   . Stroke Father   . Prostate cancer Brother   . Stomach cancer Maternal Grandmother   . Colon cancer Neg Hx   . Esophageal cancer Neg Hx   . Pancreatic cancer Neg Hx   . Rectal cancer Neg Hx     Social History Social History   Tobacco Use  . Smoking status: Former Research scientist (life sciences)  . Smokeless tobacco: Never Used  . Tobacco comment: quit 35-40 years ago per pt  Vaping Use  . Vaping Use: Never used  Substance Use Topics  . Alcohol use:  Not Currently  . Drug use: No     Allergies   Patient has no known allergies.   Review of Systems Review of Systems  Constitutional: Positive for chills, diaphoresis, fatigue and fever.  HENT: Negative for congestion, ear discharge, ear pain, postnasal drip, rhinorrhea, sore throat and trouble swallowing.   Eyes: Negative for discharge.  Respiratory: Positive for cough and shortness of breath. Negative for chest tightness.   Cardiovascular: Negative for chest pain.  Gastrointestinal: Negative for abdominal pain, diarrhea, nausea and vomiting.  Musculoskeletal: Negative for arthralgias and myalgias.  Skin: Negative for rash.  Neurological: Negative for dizziness and weakness.  Hematological: Negative for adenopathy.     Physical Exam Triage Vital Signs ED Triage Vitals [08/25/19 1324]  Enc Vitals Group     BP (!) 128/56     Pulse Rate (!) 103     Resp 18     Temp 99.8 F (37.7 C)     Temp Source Oral     SpO2 93 %     Weight 207 lb (93.9 kg)     Height 5' 8.5" (1.74 m)     Head Circumference      Peak Flow      Pain Score 0     Pain Loc      Pain Edu?      Excl. in [place]?    No data found.  Updated Vital Signs BP (!) 128/56   Pulse (!) 103   Temp 99.8 F (37.7 C) (Oral)   Resp 18   Ht 5' 8.5" (1.74 m)   Wt 207 lb (93.9 kg)   SpO2 93%   BMI 31.02 kg/m   Visual Acuity Right Eye Distance:   Left Eye Distance:   Bilateral Distance:    Right Eye Near:   Left Eye Near:    Bilateral Near:     Physical Exam Constitutional:      General: He is not in acute distress.    Appearance: He is not toxic-appearing.  HENT:     Head: Atraumatic.     Right Ear: Tympanic membrane, ear canal and external ear normal.     Left Ear: Tympanic membrane, ear canal and external ear normal.     Nose: Nose normal.  Eyes:     General: No scleral icterus.    Conjunctiva/sclera: Conjunctivae normal.  Cardiovascular:     Rate and Rhythm: Normal rate and regular rhythm.   Pulmonary:     Effort: Pulmonary effort is normal. No respiratory distress.     Breath sounds: Rales present.  Comments: Crackles heard on LLL and decreased breath sounds on RLL Musculoskeletal:        General: Normal range of motion.     Cervical back: Neck supple.  Lymphadenopathy:     Cervical: No cervical adenopathy.  Skin:    General: Skin is warm and dry.     Findings: No rash.  Neurological:     Mental Status: He is alert and oriented to person, place, and time.  Psychiatric:        Mood and Affect: Mood normal.        Behavior: Behavior normal.        Thought Content: Thought content normal.        Judgment: Judgment normal.      UC Treatments / Results  Labs (all labs ordered are listed, but only abnormal results are displayed) Labs Reviewed - No data to display  EKG   Radiology DG Chest 2 View  Addendum Date: 08/25/2019   ADDENDUM REPORT: 08/25/2019 14:39 ADDENDUM: These results were called by telephone at the time of interpretation on 08/25/2019 at 2:39 pm to provider [facility] , who verbally acknowledged these results. Electronically Signed   By: Franchot Gallo M.D.   On: 08/25/2019 14:39   Result Date: 08/25/2019 CLINICAL DATA:  Cough and short of breath.  Fever 2 weeks. EXAM: CHEST - 2 VIEW COMPARISON:  11/30/2012 FINDINGS: Right middle lobe airspace density. The right lower hilum appears enlarged. Prominent interstitial markings are present in the right lung base laterally. Findings suspicious for lung carcinoma possibly was pneumonia. Close follow-up warranted. Heart size normal. Negative for heart failure. Left lung clear. Small pleural effusion. IMPRESSION: Enlargement of the right hilum with right middle lobe mass/pneumonia. Findings suspicious for carcinoma lung. Given the patient's current symptoms in fever, follow-up chest x-ray recommended in 2-4 weeks. CT chest with contrast may also be indicated . Electronically Signed: By: Franchot Gallo M.D. On: 08/25/2019 14:23    Procedures Procedures (including critical care time)  Medications Ordered in UC Medications - No data to display  Initial Impression / Assessment and Plan / UC Course  I have reviewed the triage vital signs and the nursing notes. Pertinent  imaging results that were available during my care of the patient were reviewed with me by the Radiologist who suspect the R lung mass to be malignant and recommends chest CT which I ordered today and informed pt. I went ahead and placed him on Levaquin since he continues having fevers to cover underlying lobar pneumonia, prednisone and Albuterol inhaler. See instructions.   Final Clinical Impressions(s) / UC Diagnoses   Final diagnoses:  Right lower lobe lung mass     Discharge Instructions     I have placed an order for a chest CT to check for the right lung mass we see on the chest xray. The radiologist believes it is suspicious for lung cancer. Please follow up with your family Dr a couple of days after done.  I am placing you on a different antibiotic called Levaquin to cover pneumonia and prednisone to see if this will help with the wheezing and breathing better     ED Prescriptions    Medication Sig Dispense Auth. Provider   albuterol (VENTOLIN HFA) 108 (90 Base) MCG/ACT inhaler Inhale 2 puffs into the lungs every 4 (four) hours as needed for wheezing or shortness of breath. 18 g Rodriguez-Southworth, Kenslie Abbruzzese, PA-C   levofloxacin (LEVAQUIN) 500 MG tablet Take 1 tablet (500 mg  total) by mouth daily. 7 tablet Rodriguez-Southworth, Sunday Spillers, PA-C   predniSONE (DELTASONE) 20 MG tablet One bid x 3 days, then one qd x 3 9 tablet Rodriguez-Southworth, Sunday Spillers, PA-C     PDMP not reviewed this encounter.   Shelby Mattocks, PA-C 08/25/19 1456

## 2019-08-25 NOTE — ED Triage Notes (Signed)
Pt c/o non productive cough, dyspnea w/exertion, fever, dizzinessx2 wks. Pt states PCP started him on atx a wk ago and he tested him for COVID and it was neg. Pt states the atx hasn't helped him. Pt has non labored breathing.

## 2019-09-01 ENCOUNTER — Telehealth: Payer: Self-pay | Admitting: Primary Care

## 2019-09-01 DIAGNOSIS — R9389 Abnormal findings on diagnostic imaging of other specified body structures: Secondary | ICD-10-CM

## 2019-09-01 NOTE — Telephone Encounter (Signed)
Pt was seen in ED as directed 08/25/19 - see d/c instructions  Discharge Instructions     I have placed an order for a chest CT to check for the right lung mass we see on the chest xray. The radiologist believes it is suspicious for lung cancer. Please follow up with your family Dr a couple of days after done.  I am placing you on a different antibiotic called Levaquin to cover pneumonia and prednisone to see if this will help with the wheezing and breathing better   Pt states that he has not heard yet from the CT scan -- there is no order in Epic.  Pt is still running a fever of 102.1  Temp has been ranging from 99.5 - 102  Please advise Anda Kraft.

## 2019-09-01 NOTE — Telephone Encounter (Signed)
Patient to be scheduled by Rosaria Ferries to see me tomorrow for labs and follow-up.

## 2019-09-01 NOTE — Telephone Encounter (Signed)
Please notify patient that I will place the order for the CT scan of his chest.  Have him call us back if he doesn't hear anything within 1-2 days.  Also, did anyone test him for Covid-19?

## 2019-09-01 NOTE — Telephone Encounter (Signed)
Patient needs a Bun/Creat for Stat CT that we have scheduled for tomorrow 7/8 at [place] . Please place orders for the Lab-Bun/Creat and please order them Stat.

## 2019-09-01 NOTE — Telephone Encounter (Signed)
Spoken and notified patient of Jerry Wong comments. Patient stated that he was tested and it was negative for covid.  Patient stated that he is taking Advil and Tylenol (alternating) for the fever but not much better. He stated that he had a fever almost 2 weeks now. Please advise.

## 2019-09-02 ENCOUNTER — Encounter (HOSPITAL_COMMUNITY): Payer: Self-pay | Admitting: Emergency Medicine

## 2019-09-02 ENCOUNTER — Inpatient Hospital Stay (HOSPITAL_COMMUNITY)
Admission: EM | Admit: 2019-09-02 | Discharge: 2019-09-09 | DRG: 853 | Disposition: A | Discharge status: Home / Self Care | Payer: Medicare Other | Attending: Internal Medicine | Admitting: Internal Medicine

## 2019-09-02 ENCOUNTER — Ambulatory Visit (INDEPENDENT_AMBULATORY_CARE_PROVIDER_SITE_OTHER)
Admission: RE | Admit: 2019-09-02 | Discharge: 2019-09-02 | Disposition: A | Discharge status: Home / Self Care | Payer: Medicare Other | Source: Ambulatory Visit | Attending: Primary Care | Admitting: Primary Care

## 2019-09-02 ENCOUNTER — Encounter: Payer: Self-pay | Admitting: Primary Care

## 2019-09-02 ENCOUNTER — Other Ambulatory Visit: Payer: Self-pay

## 2019-09-02 ENCOUNTER — Ambulatory Visit (INDEPENDENT_AMBULATORY_CARE_PROVIDER_SITE_OTHER): Payer: Medicare Other | Admitting: Primary Care

## 2019-09-02 VITALS — BP 120/82 | HR 114 | Temp 102.5°F | Ht 69.0 in | Wt 208.8 lb

## 2019-09-02 DIAGNOSIS — Z9079 Acquired absence of other genital organ(s): Secondary | ICD-10-CM

## 2019-09-02 DIAGNOSIS — I129 Hypertensive chronic kidney disease with stage 1 through stage 4 chronic kidney disease, or unspecified chronic kidney disease: Secondary | ICD-10-CM | POA: Diagnosis not present

## 2019-09-02 DIAGNOSIS — J9621 Acute and chronic respiratory failure with hypoxia: Secondary | ICD-10-CM | POA: Diagnosis not present

## 2019-09-02 DIAGNOSIS — J9811 Atelectasis: Secondary | ICD-10-CM | POA: Diagnosis not present

## 2019-09-02 DIAGNOSIS — I499 Cardiac arrhythmia, unspecified: Secondary | ICD-10-CM | POA: Diagnosis not present

## 2019-09-02 DIAGNOSIS — Z87442 Personal history of urinary calculi: Secondary | ICD-10-CM

## 2019-09-02 DIAGNOSIS — A419 Sepsis, unspecified organism: Secondary | ICD-10-CM | POA: Diagnosis not present

## 2019-09-02 DIAGNOSIS — Z8249 Family history of ischemic heart disease and other diseases of the circulatory system: Secondary | ICD-10-CM

## 2019-09-02 DIAGNOSIS — R0602 Shortness of breath: Secondary | ICD-10-CM

## 2019-09-02 DIAGNOSIS — Z87891 Personal history of nicotine dependence: Secondary | ICD-10-CM | POA: Diagnosis not present

## 2019-09-02 DIAGNOSIS — I2510 Atherosclerotic heart disease of native coronary artery without angina pectoris: Secondary | ICD-10-CM | POA: Diagnosis not present

## 2019-09-02 DIAGNOSIS — G4700 Insomnia, unspecified: Secondary | ICD-10-CM | POA: Diagnosis not present

## 2019-09-02 DIAGNOSIS — N179 Acute kidney failure, unspecified: Secondary | ICD-10-CM | POA: Diagnosis present

## 2019-09-02 DIAGNOSIS — E876 Hypokalemia: Secondary | ICD-10-CM | POA: Diagnosis not present

## 2019-09-02 DIAGNOSIS — E785 Hyperlipidemia, unspecified: Secondary | ICD-10-CM | POA: Diagnosis present

## 2019-09-02 DIAGNOSIS — M549 Dorsalgia, unspecified: Secondary | ICD-10-CM | POA: Diagnosis not present

## 2019-09-02 DIAGNOSIS — Z20822 Contact with and (suspected) exposure to covid-19: Secondary | ICD-10-CM | POA: Diagnosis not present

## 2019-09-02 DIAGNOSIS — G8929 Other chronic pain: Secondary | ICD-10-CM | POA: Diagnosis present

## 2019-09-02 DIAGNOSIS — I10 Essential (primary) hypertension: Secondary | ICD-10-CM | POA: Diagnosis present

## 2019-09-02 DIAGNOSIS — D329 Benign neoplasm of meninges, unspecified: Secondary | ICD-10-CM | POA: Diagnosis present

## 2019-09-02 DIAGNOSIS — R9389 Abnormal findings on diagnostic imaging of other specified body structures: Secondary | ICD-10-CM

## 2019-09-02 DIAGNOSIS — E871 Hypo-osmolality and hyponatremia: Secondary | ICD-10-CM | POA: Diagnosis present

## 2019-09-02 DIAGNOSIS — D649 Anemia, unspecified: Secondary | ICD-10-CM | POA: Diagnosis not present

## 2019-09-02 DIAGNOSIS — R6520 Severe sepsis without septic shock: Secondary | ICD-10-CM | POA: Diagnosis not present

## 2019-09-02 DIAGNOSIS — I700 Atherosclerosis of aorta: Secondary | ICD-10-CM | POA: Diagnosis not present

## 2019-09-02 DIAGNOSIS — M542 Cervicalgia: Secondary | ICD-10-CM | POA: Diagnosis not present

## 2019-09-02 DIAGNOSIS — R319 Hematuria, unspecified: Secondary | ICD-10-CM | POA: Diagnosis present

## 2019-09-02 DIAGNOSIS — N1831 Chronic kidney disease, stage 3a: Secondary | ICD-10-CM | POA: Diagnosis present

## 2019-09-02 DIAGNOSIS — E669 Obesity, unspecified: Secondary | ICD-10-CM | POA: Diagnosis present

## 2019-09-02 DIAGNOSIS — Z79899 Other long term (current) drug therapy: Secondary | ICD-10-CM

## 2019-09-02 DIAGNOSIS — Z7982 Long term (current) use of aspirin: Secondary | ICD-10-CM

## 2019-09-02 DIAGNOSIS — N189 Chronic kidney disease, unspecified: Secondary | ICD-10-CM | POA: Diagnosis present

## 2019-09-02 DIAGNOSIS — J9601 Acute respiratory failure with hypoxia: Secondary | ICD-10-CM | POA: Diagnosis not present

## 2019-09-02 DIAGNOSIS — M1990 Unspecified osteoarthritis, unspecified site: Secondary | ICD-10-CM | POA: Diagnosis present

## 2019-09-02 DIAGNOSIS — J189 Pneumonia, unspecified organism: Secondary | ICD-10-CM | POA: Diagnosis present

## 2019-09-02 DIAGNOSIS — M109 Gout, unspecified: Secondary | ICD-10-CM | POA: Diagnosis present

## 2019-09-02 DIAGNOSIS — G629 Polyneuropathy, unspecified: Secondary | ICD-10-CM | POA: Diagnosis present

## 2019-09-02 DIAGNOSIS — R059 Cough, unspecified: Secondary | ICD-10-CM | POA: Insufficient documentation

## 2019-09-02 DIAGNOSIS — R509 Fever, unspecified: Secondary | ICD-10-CM | POA: Diagnosis not present

## 2019-09-02 DIAGNOSIS — Z83438 Family history of other disorder of lipoprotein metabolism and other lipidemia: Secondary | ICD-10-CM

## 2019-09-02 DIAGNOSIS — R05 Cough: Secondary | ICD-10-CM

## 2019-09-02 DIAGNOSIS — C3431 Malignant neoplasm of lower lobe, right bronchus or lung: Secondary | ICD-10-CM | POA: Diagnosis not present

## 2019-09-02 DIAGNOSIS — E279 Disorder of adrenal gland, unspecified: Secondary | ICD-10-CM | POA: Diagnosis not present

## 2019-09-02 DIAGNOSIS — R519 Headache, unspecified: Secondary | ICD-10-CM | POA: Diagnosis present

## 2019-09-02 DIAGNOSIS — Z801 Family history of malignant neoplasm of trachea, bronchus and lung: Secondary | ICD-10-CM

## 2019-09-02 DIAGNOSIS — C61 Malignant neoplasm of prostate: Secondary | ICD-10-CM | POA: Diagnosis present

## 2019-09-02 DIAGNOSIS — I313 Pericardial effusion (noninflammatory): Secondary | ICD-10-CM | POA: Diagnosis not present

## 2019-09-02 DIAGNOSIS — Z6831 Body mass index (BMI) 31.0-31.9, adult: Secondary | ICD-10-CM

## 2019-09-02 DIAGNOSIS — Z8546 Personal history of malignant neoplasm of prostate: Secondary | ICD-10-CM

## 2019-09-02 DIAGNOSIS — Z8619 Personal history of other infectious and parasitic diseases: Secondary | ICD-10-CM

## 2019-09-02 DIAGNOSIS — Z8042 Family history of malignant neoplasm of prostate: Secondary | ICD-10-CM

## 2019-09-02 LAB — CBC WITH DIFFERENTIAL/PLATELET
Abs Immature Granulocytes: 0.5 10*3/uL — ABNORMAL HIGH (ref 0.00–0.07)
Basophils Absolute: 0 10*3/uL (ref 0.0–0.1)
Basophils Absolute: 0.1 10*3/uL (ref 0.0–0.1)
Basophils Relative: 0.2 % (ref 0.0–3.0)
Basophils Relative: 0 %
Eosinophils Absolute: 0.2 10*3/uL (ref 0.0–0.7)
Eosinophils Absolute: 0.1 10*3/uL (ref 0.0–0.5)
Eosinophils Relative: 1.3 % (ref 0.0–5.0)
Eosinophils Relative: 1 %
HCT: 34.4 % — ABNORMAL LOW (ref 39.0–52.0)
HCT: 35.5 % — ABNORMAL LOW (ref 39.0–52.0)
Hemoglobin: 11.3 g/dL — ABNORMAL LOW (ref 13.0–17.0)
Hemoglobin: 11.5 g/dL — ABNORMAL LOW (ref 13.0–17.0)
Immature Granulocytes: 3 %
Lymphocytes Relative: 9.3 % — ABNORMAL LOW (ref 12.0–46.0)
Lymphocytes Relative: 12 %
Lymphs Abs: 1.6 10*3/uL (ref 0.7–4.0)
Lymphs Abs: 2 10*3/uL (ref 0.7–4.0)
MCH: 26.4 pg (ref 26.0–34.0)
MCHC: 32.9 g/dL (ref 30.0–36.0)
MCHC: 32.4 g/dL (ref 30.0–36.0)
MCV: 80.6 fl (ref 78.0–100.0)
MCV: 81.4 fL (ref 80.0–100.0)
Monocytes Absolute: 1.8 10*3/uL — ABNORMAL HIGH (ref 0.1–1.0)
Monocytes Absolute: 1.9 10*3/uL — ABNORMAL HIGH (ref 0.1–1.0)
Monocytes Relative: 10.6 % (ref 3.0–12.0)
Monocytes Relative: 11 %
Neutro Abs: 13.3 10*3/uL — ABNORMAL HIGH (ref 1.4–7.7)
Neutro Abs: 11.9 10*3/uL — ABNORMAL HIGH (ref 1.7–7.7)
Neutrophils Relative %: 78.6 % — ABNORMAL HIGH (ref 43.0–77.0)
Neutrophils Relative %: 73 %
Platelets: 358 10*3/uL (ref 150.0–400.0)
Platelets: 367 10*3/uL (ref 150–400)
RBC: 4.26 Mil/uL (ref 4.22–5.81)
RBC: 4.36 MIL/uL (ref 4.22–5.81)
RDW: 15 % (ref 11.5–15.5)
RDW: 14.4 % (ref 11.5–15.5)
WBC: 16.9 10*3/uL — ABNORMAL HIGH (ref 4.0–10.5)
WBC: 16.4 10*3/uL — ABNORMAL HIGH (ref 4.0–10.5)
nRBC: 0 % (ref 0.0–0.2)

## 2019-09-02 LAB — BASIC METABOLIC PANEL
BUN: 29 mg/dL — ABNORMAL HIGH (ref 6–23)
CO2: 28 mEq/L (ref 19–32)
Calcium: 9.1 mg/dL (ref 8.4–10.5)
Chloride: 95 mEq/L — ABNORMAL LOW (ref 96–112)
Creatinine, Ser: 1.52 mg/dL — ABNORMAL HIGH (ref 0.40–1.50)
GFR: 45.31 mL/min — ABNORMAL LOW (ref >60.00)
Glucose, Bld: 130 mg/dL — ABNORMAL HIGH (ref 70–99)
Potassium: 3.8 mEq/L (ref 3.5–5.1)
Sodium: 132 mEq/L — ABNORMAL LOW (ref 135–145)

## 2019-09-02 LAB — COMPREHENSIVE METABOLIC PANEL
ALT: 19 U/L (ref 0–44)
AST: 21 U/L (ref 15–41)
Albumin: 3.1 g/dL — ABNORMAL LOW (ref 3.5–5.0)
Alkaline Phosphatase: 66 U/L (ref 38–126)
Anion gap: 15 (ref 5–15)
BUN: 24 mg/dL — ABNORMAL HIGH (ref 8–23)
CO2: 21 mmol/L — ABNORMAL LOW (ref 22–32)
Calcium: 8.6 mg/dL — ABNORMAL LOW (ref 8.9–10.3)
Chloride: 97 mmol/L — ABNORMAL LOW (ref 98–111)
Creatinine, Ser: 1.51 mg/dL — ABNORMAL HIGH (ref 0.61–1.24)
GFR calc Af Amer: 53 mL/min — ABNORMAL LOW (ref >60)
GFR calc non Af Amer: 46 mL/min — ABNORMAL LOW (ref >60)
Glucose, Bld: 131 mg/dL — ABNORMAL HIGH (ref 70–99)
Potassium: 3.7 mmol/L (ref 3.5–5.1)
Sodium: 133 mmol/L — ABNORMAL LOW (ref 135–145)
Total Bilirubin: 1 mg/dL (ref 0.3–1.2)
Total Protein: 6.8 g/dL (ref 6.5–8.1)

## 2019-09-02 MED ORDER — IOHEXOL 300 MG/ML  SOLN
65.0000 mL | Freq: Once | INTRAMUSCULAR | Status: AC | PRN
  Administered 2019-09-02: 65 mL via INTRAVENOUS

## 2019-09-02 NOTE — Assessment & Plan Note (Addendum)
Acute for several weeks, also with fevers. He has been treated twice now with antibiotics, no improvement. Chest xray from Urgent Care stay reviewed and is suspicious for lung mass. He stopped smoking 42 years ago.  Stat CT chest ordered and is pending for today. Stat labs ordered and pending.  ECG with sinus tachycardia, non specific ST and T wave abnormality, actually appears similar to ECG from 2019.  He does appear fatigued and ill, but is in no distress. Will start with outpatient treatment, but with very strict ED precautions. Depending on labs, he very well may need ED evaluation. For now we will closely monitor.

## 2019-09-02 NOTE — ED Triage Notes (Signed)
Pt states he was sent by pcp for treatment for fever, states he need some IV fluids and abx.

## 2019-09-02 NOTE — Patient Instructions (Signed)
Stop by the lab prior to leaving today. I will notify you of your results once received.   Complete your CT chest today as scheduled.  It was a pleasure to see you today!

## 2019-09-02 NOTE — Progress Notes (Signed)
Subjective:    Patient ID: Jerry Wong, male    DOB: Jan 08, 1946, 74 y.o.   MRN: [id number]  HPI  This visit occurred during the SARS-CoV-2 public health emergency.  Safety protocols were in place, including screening questions prior to the visit, additional usage of staff PPE, and extensive cleaning of exam room while observing appropriate contact time as indicated for disinfecting solutions.   Jerry Wong is a 74 year old male with a history of tobacco abuse, hypertension, prostate cancer, prediabetes who presents today for Urgent Care follow up.  He presented to [facility] on 08/[facility]/19 with a chief complaint of cough, fevers, and wheezing. Exam with "crackles" to LLL and decreased breath sounds to RLL. Chest xray showed enlargement of right hilum with RML mass/pneumonia. Findings also suspicious of carcinoma of the lung. CT chest was recommended. He was prescribed Levaquin.  He was originally evaluated by Dr. Lorelei Pont virtually on 08/12/19, prescribed Doxycycline antibiotics for fevers and cough. Covid-19 test was negative.   Today he continues to run fevers, he is feeling fatigued, shortness of breath with mild exertion, intermittent non productive cough. He's run a fever for the last two weeks total. He tested negative for Covid-19. He is compliant to his Levaquin as prescribed. He is scheduled for his CT chest later today. He is due for lab work. He stopped smoking 42 years ago.   He's had nothing to drink today, has been staying hydrated with water. He is taking Tylenol twice daily with temporary improvement in fevers, last dose of Tylenol was last night.   BP Readings from Last 3 Encounters:  09/02/19 120/82  08/25/19 (!) 128/56  01/15/19 130/82     Review of Systems  Constitutional: Positive for fatigue and fever.  Respiratory: Positive for cough and shortness of breath.   Cardiovascular: Negative for chest pain.  Neurological: Negative for dizziness.       Past Medical History:    Diagnosis Date  . Arthritis   . Cataract   . Chickenpox   . Chronic headaches   . Chronic kidney disease   . Chronic neck and back pain   . History of kidney stones   . Hyperlipidemia   . Hypertension   . Insomnia   . Neuromuscular disorder ([place])   . Prostate cancer [facility])      Social History   Socioeconomic History  . Marital status: Married    Spouse name: Jerry Wong  . Number of children: 3  . Years of education: 78  . Highest education level: Not on file  Occupational History  . Not on file  Tobacco Use  . Smoking status: Former Research scientist (life sciences)  . Smokeless tobacco: Never Used  . Tobacco comment: quit 35-40 years ago per pt  Vaping Use  . Vaping Use: Never used  Substance and Sexual Activity  . Alcohol use: Not Currently  . Drug use: No  . Sexual activity: Not Currently  Other Topics Concern  . Not on file  Social History Narrative   Patient is married Jerry Wong) and lives at home with his wife and son.   Patient has three children.   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   Patient has a high school education.   Patient is right-handed.   Patient drinks one cup of coffee every morning.   Social Determinants of Health   Financial Resource Strain: Low Risk   . Difficulty of Paying Living Expenses: Not hard at all  Food Insecurity: No Food  Insecurity  . Worried About Charity fundraiser in the Last Year: Never true  . Ran Out of Food in the Last Year: Never true  Transportation Needs: No Transportation Needs  . Lack of Transportation (Medical): No  . Lack of Transportation (Non-Medical): No  Physical Activity: Inactive  . Days of Exercise per Week: 0 days  . Minutes of Exercise per Session: 0 min  Stress: No Stress Concern Present  . Feeling of Stress : Not at all  Social Connections:   . Frequency of Communication with Friends and Family:   . Frequency of Social Gatherings with Friends and Family:   . Attends Religious Services:   . Active Member of Clubs  or Organizations:   . Attends Archivist Meetings:   Marland Kitchen Marital Status:   Intimate Partner Violence: Not At Risk  . Fear of Current or Ex-Partner: No  . Emotionally Abused: No  . Physically Abused: No  . Sexually Abused: No    Past Surgical History:  Procedure Laterality Date  . BACK SURGERY  1974  . NECK SURGERY  2002  . PROSTATECTOMY    . TONSILLECTOMY  1958  . TRIGGER FINGER RELEASE Left 11/20/2017   left thumb/Dr. Apolonio Schneiders    Family History  Problem Relation Age of Onset  . Arthritis Mother   . Lung cancer Mother   . Arthritis Father   . Lung cancer Father   . Hyperlipidemia Father   . Hypertension Father   . Stroke Father   . Prostate cancer Brother   . Stomach cancer Maternal Grandmother   . Colon cancer Neg Hx   . Esophageal cancer Neg Hx   . Pancreatic cancer Neg Hx   . Rectal cancer Neg Hx     No Known Allergies  Current Outpatient Medications on File Prior to Visit  Medication Sig Dispense Refill  . albuterol (VENTOLIN HFA) 108 (90 Base) MCG/ACT inhaler Inhale 2 puffs into the lungs every 4 (four) hours as needed for wheezing or shortness of breath. 18 g 0  . amitriptyline (ELAVIL) 75 MG tablet Take 1 tablet (75 mg total) by mouth at bedtime. 90 tablet 3  . amLODipine (NORVASC) 10 MG tablet TAKE 1 TABLET BY MOUTH EVERY DAY 90 tablet 2  . aspirin EC 81 MG tablet Take 81 mg by mouth daily.     Marland Kitchen atorvastatin (LIPITOR) 20 MG tablet TAKE 1 TABLET BY MOUTH EVERY DAY 90 tablet 1  . hydrochlorothiazide (HYDRODIURIL) 25 MG tablet TAKE 1 TABLET BY MOUTH EVERY DAY FOR BLOOD PRESSURE 90 tablet 0  . levofloxacin (LEVAQUIN) 500 MG tablet Take 1 tablet (500 mg total) by mouth daily. 7 tablet 0  . LYRICA 100 MG capsule TAKE 1 CAPSULE BY MOUTH TWICE A DAY AS DIRECTED  2  . Omega-3 Fatty Acids ([employer] FISH OIL) 1000 MG CAPS TAKE 1 CAPSULE BY MOUTH EVERY DAY WITH A MEAL 90 capsule 3  . oxyCODONE-acetaminophen (PERCOCET) 10-325 MG per tablet Take 1 tablet by mouth as  needed for pain.     . predniSONE (DELTASONE) 20 MG tablet One bid x 3 days, then one qd x 3 9 tablet 0  . tiZANidine (ZANAFLEX) 4 MG tablet TAKE 1 TABLET BY MOUTH TWICE A DAY 180 tablet 3  . zolpidem (AMBIEN) 10 MG tablet Take 1 tablet (10 mg total) by mouth at bedtime as needed. 30 tablet 4   No current facility-administered medications on file prior to visit.    BP 120/82  Pulse (!) 114   Temp (!) 102.5 F (39.2 C) (Oral)   Ht 5\' 9"  (1.753 m)   Wt 208 lb 12 oz (94.7 kg)   SpO2 98%   BMI 30.83 kg/m    Objective:   Physical Exam Constitutional:      General: He is not in acute distress.    Appearance: He is ill-appearing. He is not diaphoretic.  Cardiovascular:     Rate and Rhythm: Rhythm irregular.  Pulmonary:     Effort: Pulmonary effort is normal.     Comments: Clear lungs to left side, right side upper and middle lobe diminished.  Skin:    General: Skin is warm and dry.            Assessment & Plan:

## 2019-09-03 ENCOUNTER — Encounter (HOSPITAL_COMMUNITY): Payer: Self-pay | Admitting: Internal Medicine

## 2019-09-03 ENCOUNTER — Emergency Department (HOSPITAL_COMMUNITY): Payer: Medicare Other

## 2019-09-03 DIAGNOSIS — Z9079 Acquired absence of other genital organ(s): Secondary | ICD-10-CM | POA: Diagnosis not present

## 2019-09-03 DIAGNOSIS — N1831 Chronic kidney disease, stage 3a: Secondary | ICD-10-CM | POA: Diagnosis present

## 2019-09-03 DIAGNOSIS — N179 Acute kidney failure, unspecified: Secondary | ICD-10-CM | POA: Diagnosis not present

## 2019-09-03 DIAGNOSIS — R590 Localized enlarged lymph nodes: Secondary | ICD-10-CM

## 2019-09-03 DIAGNOSIS — Z6831 Body mass index (BMI) 31.0-31.9, adult: Secondary | ICD-10-CM | POA: Diagnosis not present

## 2019-09-03 DIAGNOSIS — A419 Sepsis, unspecified organism: Secondary | ICD-10-CM | POA: Diagnosis not present

## 2019-09-03 DIAGNOSIS — Z8546 Personal history of malignant neoplasm of prostate: Secondary | ICD-10-CM | POA: Diagnosis not present

## 2019-09-03 DIAGNOSIS — M542 Cervicalgia: Secondary | ICD-10-CM | POA: Diagnosis not present

## 2019-09-03 DIAGNOSIS — E876 Hypokalemia: Secondary | ICD-10-CM | POA: Diagnosis present

## 2019-09-03 DIAGNOSIS — G8929 Other chronic pain: Secondary | ICD-10-CM | POA: Diagnosis present

## 2019-09-03 DIAGNOSIS — N189 Chronic kidney disease, unspecified: Secondary | ICD-10-CM

## 2019-09-03 DIAGNOSIS — R591 Generalized enlarged lymph nodes: Secondary | ICD-10-CM | POA: Diagnosis not present

## 2019-09-03 DIAGNOSIS — D649 Anemia, unspecified: Secondary | ICD-10-CM

## 2019-09-03 DIAGNOSIS — E871 Hypo-osmolality and hyponatremia: Secondary | ICD-10-CM | POA: Diagnosis present

## 2019-09-03 DIAGNOSIS — R05 Cough: Secondary | ICD-10-CM | POA: Diagnosis not present

## 2019-09-03 DIAGNOSIS — C3491 Malignant neoplasm of unspecified part of right bronchus or lung: Secondary | ICD-10-CM | POA: Diagnosis not present

## 2019-09-03 DIAGNOSIS — I10 Essential (primary) hypertension: Secondary | ICD-10-CM

## 2019-09-03 DIAGNOSIS — M109 Gout, unspecified: Secondary | ICD-10-CM | POA: Diagnosis present

## 2019-09-03 DIAGNOSIS — Z20822 Contact with and (suspected) exposure to covid-19: Secondary | ICD-10-CM | POA: Diagnosis not present

## 2019-09-03 DIAGNOSIS — R0602 Shortness of breath: Secondary | ICD-10-CM | POA: Diagnosis not present

## 2019-09-03 DIAGNOSIS — R6520 Severe sepsis without septic shock: Secondary | ICD-10-CM | POA: Diagnosis not present

## 2019-09-03 DIAGNOSIS — J3489 Other specified disorders of nose and nasal sinuses: Secondary | ICD-10-CM | POA: Diagnosis not present

## 2019-09-03 DIAGNOSIS — E785 Hyperlipidemia, unspecified: Secondary | ICD-10-CM | POA: Diagnosis not present

## 2019-09-03 DIAGNOSIS — J181 Lobar pneumonia, unspecified organism: Secondary | ICD-10-CM | POA: Diagnosis not present

## 2019-09-03 DIAGNOSIS — M549 Dorsalgia, unspecified: Secondary | ICD-10-CM

## 2019-09-03 DIAGNOSIS — J9621 Acute and chronic respiratory failure with hypoxia: Secondary | ICD-10-CM | POA: Diagnosis not present

## 2019-09-03 DIAGNOSIS — G9389 Other specified disorders of brain: Secondary | ICD-10-CM | POA: Diagnosis not present

## 2019-09-03 DIAGNOSIS — C3431 Malignant neoplasm of lower lobe, right bronchus or lung: Secondary | ICD-10-CM | POA: Diagnosis present

## 2019-09-03 DIAGNOSIS — E279 Disorder of adrenal gland, unspecified: Secondary | ICD-10-CM | POA: Diagnosis present

## 2019-09-03 DIAGNOSIS — E669 Obesity, unspecified: Secondary | ICD-10-CM | POA: Diagnosis present

## 2019-09-03 DIAGNOSIS — R509 Fever, unspecified: Secondary | ICD-10-CM | POA: Diagnosis not present

## 2019-09-03 DIAGNOSIS — J9601 Acute respiratory failure with hypoxia: Secondary | ICD-10-CM | POA: Diagnosis not present

## 2019-09-03 DIAGNOSIS — I129 Hypertensive chronic kidney disease with stage 1 through stage 4 chronic kidney disease, or unspecified chronic kidney disease: Secondary | ICD-10-CM | POA: Diagnosis present

## 2019-09-03 DIAGNOSIS — G629 Polyneuropathy, unspecified: Secondary | ICD-10-CM | POA: Diagnosis present

## 2019-09-03 DIAGNOSIS — C61 Malignant neoplasm of prostate: Secondary | ICD-10-CM

## 2019-09-03 DIAGNOSIS — Z87891 Personal history of nicotine dependence: Secondary | ICD-10-CM | POA: Diagnosis not present

## 2019-09-03 DIAGNOSIS — R918 Other nonspecific abnormal finding of lung field: Secondary | ICD-10-CM

## 2019-09-03 DIAGNOSIS — J189 Pneumonia, unspecified organism: Secondary | ICD-10-CM | POA: Diagnosis not present

## 2019-09-03 DIAGNOSIS — G4700 Insomnia, unspecified: Secondary | ICD-10-CM | POA: Diagnosis present

## 2019-09-03 DIAGNOSIS — C771 Secondary and unspecified malignant neoplasm of intrathoracic lymph nodes: Secondary | ICD-10-CM | POA: Diagnosis not present

## 2019-09-03 DIAGNOSIS — D329 Benign neoplasm of meninges, unspecified: Secondary | ICD-10-CM | POA: Diagnosis not present

## 2019-09-03 LAB — URINALYSIS, ROUTINE W REFLEX MICROSCOPIC
Bacteria, UA: NONE SEEN
Bilirubin Urine: NEGATIVE
Glucose, UA: NEGATIVE mg/dL
Ketones, ur: NEGATIVE mg/dL
Leukocytes,Ua: NEGATIVE
Nitrite: NEGATIVE
Protein, ur: NEGATIVE mg/dL
Specific Gravity, Urine: 1.035 — ABNORMAL HIGH (ref 1.005–1.030)
pH: 5 (ref 5.0–8.0)

## 2019-09-03 LAB — HIV ANTIBODY (ROUTINE TESTING W REFLEX): HIV Screen 4th Generation wRfx: NONREACTIVE

## 2019-09-03 LAB — LACTIC ACID, PLASMA
Lactic Acid, Venous: 2 mmol/L (ref 0.5–1.9)
Lactic Acid, Venous: 1.6 mmol/L (ref 0.5–1.9)
Lactic Acid, Venous: 1.2 mmol/L (ref 0.5–1.9)

## 2019-09-03 LAB — SARS CORONAVIRUS 2 BY RT PCR (HOSPITAL ORDER, PERFORMED IN ~~LOC~~ HOSPITAL LAB): SARS Coronavirus 2: NEGATIVE

## 2019-09-03 MED ORDER — LACTATED RINGERS IV BOLUS (SEPSIS)
1000.0000 mL | Freq: Once | INTRAVENOUS | Status: AC
  Administered 2019-09-03: 1000 mL via INTRAVENOUS

## 2019-09-03 MED ORDER — LORAZEPAM 2 MG/ML IJ SOLN
0.5000 mg | Freq: Once | INTRAMUSCULAR | Status: AC
  Administered 2019-09-03: 0.5 mg via INTRAVENOUS
  Filled 2019-09-03: qty 1

## 2019-09-03 MED ORDER — OXYCODONE-ACETAMINOPHEN 10-325 MG PO TABS: 1.0000 | ORAL_TABLET | Freq: Four times a day (QID) | ORAL | Status: DC | PRN

## 2019-09-03 MED ORDER — OXYCODONE-ACETAMINOPHEN 5-325 MG PO TABS
1.0000 | ORAL_TABLET | Freq: Four times a day (QID) | ORAL | Status: DC | PRN
  Administered 2019-09-03 – 2019-09-09 (×16): 1 via ORAL
  Filled 2019-09-03 (×16): qty 1

## 2019-09-03 MED ORDER — SODIUM CHLORIDE 0.9 % IV SOLN
2.0000 g | Freq: Once | INTRAVENOUS | Status: AC
  Administered 2019-09-03: 2 g via INTRAVENOUS
  Filled 2019-09-03: qty 2

## 2019-09-03 MED ORDER — VANCOMYCIN HCL 2000 MG/400ML IV SOLN
2000.0000 mg | Freq: Once | INTRAVENOUS | Status: AC
  Administered 2019-09-03: 2000 mg via INTRAVENOUS
  Filled 2019-09-03: qty 400

## 2019-09-03 MED ORDER — TIZANIDINE HCL 4 MG PO TABS: 4.0000 mg | ORAL_TABLET | Freq: Two times a day (BID) | ORAL | Status: DC | PRN

## 2019-09-03 MED ORDER — ASPIRIN EC 81 MG PO TBEC
81.0000 mg | DELAYED_RELEASE_TABLET | Freq: Every day | ORAL | Status: DC
  Administered 2019-09-03 – 2019-09-09 (×6): 81 mg via ORAL
  Filled 2019-09-03 (×6): qty 1

## 2019-09-03 MED ORDER — VANCOMYCIN HCL IN DEXTROSE 1-5 GM/200ML-% IV SOLN: 1000.0000 mg | Freq: Once | INTRAVENOUS | Status: DC

## 2019-09-03 MED ORDER — METRONIDAZOLE IN NACL 5-0.79 MG/ML-% IV SOLN
500.0000 mg | Freq: Three times a day (TID) | INTRAVENOUS | Status: DC
  Administered 2019-09-03 – 2019-09-05 (×6): 500 mg via INTRAVENOUS
  Filled 2019-09-03 (×6): qty 100

## 2019-09-03 MED ORDER — SODIUM CHLORIDE 0.9 % IV SOLN
2.0000 g | Freq: Two times a day (BID) | INTRAVENOUS | Status: DC
  Administered 2019-09-03 – 2019-09-04 (×2): 2 g via INTRAVENOUS
  Filled 2019-09-03 (×2): qty 2

## 2019-09-03 MED ORDER — PREGABALIN 100 MG PO CAPS
100.0000 mg | ORAL_CAPSULE | Freq: Two times a day (BID) | ORAL | Status: DC
  Administered 2019-09-03 – 2019-09-09 (×12): 100 mg via ORAL
  Filled 2019-09-03: qty 2
  Filled 2019-09-03 (×11): qty 1

## 2019-09-03 MED ORDER — ZOLPIDEM TARTRATE 5 MG PO TABS
10.0000 mg | ORAL_TABLET | Freq: Every evening | ORAL | Status: DC | PRN
  Administered 2019-09-03 – 2019-09-09 (×6): 10 mg via ORAL
  Filled 2019-09-03 (×7): qty 2

## 2019-09-03 MED ORDER — ATORVASTATIN CALCIUM 10 MG PO TABS
20.0000 mg | ORAL_TABLET | Freq: Every day | ORAL | Status: DC
  Administered 2019-09-03 – 2019-09-08 (×6): 20 mg via ORAL
  Filled 2019-09-03 (×6): qty 2

## 2019-09-03 MED ORDER — VANCOMYCIN HCL 750 MG/150ML IV SOLN
750.0000 mg | Freq: Two times a day (BID) | INTRAVENOUS | Status: DC
  Administered 2019-09-03 – 2019-09-04 (×2): 750 mg via INTRAVENOUS
  Filled 2019-09-03 (×3): qty 150

## 2019-09-03 MED ORDER — AMLODIPINE BESYLATE 10 MG PO TABS
10.0000 mg | ORAL_TABLET | Freq: Every day | ORAL | Status: DC
  Administered 2019-09-03 – 2019-09-09 (×6): 10 mg via ORAL
  Filled 2019-09-03 (×4): qty 1
  Filled 2019-09-03: qty 2
  Filled 2019-09-03: qty 1

## 2019-09-03 MED ORDER — BUTALBITAL-APAP-CAFFEINE 50-325-40 MG PO TABS
1.0000 | ORAL_TABLET | ORAL | Status: AC
  Administered 2019-09-03: 1 via ORAL
  Filled 2019-09-03: qty 1

## 2019-09-03 MED ORDER — GUAIFENESIN ER 600 MG PO TB12
600.0000 mg | ORAL_TABLET | Freq: Two times a day (BID) | ORAL | Status: DC
  Administered 2019-09-03 – 2019-09-09 (×11): 600 mg via ORAL
  Filled 2019-09-03 (×12): qty 1

## 2019-09-03 MED ORDER — OXYCODONE HCL 5 MG PO TABS
5.0000 mg | ORAL_TABLET | Freq: Four times a day (QID) | ORAL | Status: DC | PRN
  Administered 2019-09-04 – 2019-09-09 (×14): 5 mg via ORAL
  Filled 2019-09-03 (×14): qty 1

## 2019-09-03 MED ORDER — ENOXAPARIN SODIUM 40 MG/0.4ML ~~LOC~~ SOLN
40.0000 mg | SUBCUTANEOUS | Status: DC
  Administered 2019-09-03 – 2019-09-09 (×7): 40 mg via SUBCUTANEOUS
  Filled 2019-09-03 (×7): qty 0.4

## 2019-09-03 MED ORDER — AMITRIPTYLINE HCL 25 MG PO TABS
75.0000 mg | ORAL_TABLET | Freq: Every day | ORAL | Status: DC
  Administered 2019-09-03 – 2019-09-08 (×6): 75 mg via ORAL
  Filled 2019-09-03 (×6): qty 3

## 2019-09-03 MED ORDER — METRONIDAZOLE IN NACL 5-0.79 MG/ML-% IV SOLN
500.0000 mg | Freq: Once | INTRAVENOUS | Status: AC
  Administered 2019-09-03: 500 mg via INTRAVENOUS
  Filled 2019-09-03: qty 100

## 2019-09-03 MED ORDER — ACETAMINOPHEN 325 MG PO TABS
650.0000 mg | ORAL_TABLET | Freq: Four times a day (QID) | ORAL | Status: DC | PRN
  Administered 2019-09-07: 650 mg via ORAL
  Filled 2019-09-03: qty 2

## 2019-09-03 NOTE — Consult Note (Addendum)
NAME:  Fed Ceci, MRN:  [id number], DOB:  10-18-1945, LOS: 0 ADMISSION DATE:  09/02/2019, CONSULTATION DATE:  09/03/2019 REFERRING MD:  Tamala Julian, CHIEF COMPLAINT:  Necrotic Lung Mass   Brief History   Rajvir Ernster is a 74 y.o. male former smoker with medical history significant of hypertension, hyperlipidemia, nephrolithiasis, neuropathy, prostate cancer s/p prostatectomy/salvage radiation/Eligard, and remote history of tobacco abuse. He was admitted on 09/02/2019 with sepsis ,  most likely source being  post obstructive pneumonia  / necrotic lymph nodes.  He has had  complaints of dyspnea on exertion, wheezing and intermittent fever and cough of the last 2-3 weeks, and had failed outpatient therapy. He was diagnosed with a pulmonary mass 08/25/2019 after a CXR at an urgent care. He was directed to the ED by his PCP after a CT chest was done revealing a right hilar lung mass, and several necrotic appearing mediastinal and right hilar  lymph nodes.  PCCM have been  consulted for work up of pulmonary mass.   History of present illness   Vadhir Mcnay is a 74 y.o. male former smoker with medical history significant of hypertension, hyperlipidemia, nephrolithiasis, neuropathy, prostate cancer s/p prostatectomy/salvage radiation/Eligard, and remote history of tobacco abuse. He was admitted on 09/02/2019 with sepsis ,  most likely source being  post obstructive pneumonia  / necrotic lymph nodes.  He has had  complaints of dyspnea on exertion, wheezing and intermittent fever and cough of the last 2-3 weeks, and had failed outpatient therapy. He states he had no issues prior to 2 weeks ago. He was diagnosed with a pulmonary mass 08/25/2019 after a CXR at an urgent care. He was directed to the ED by his PCP after a CT chest was done revealing a right hilar lung mass, and several necrotic appearing mediastinal and right hilar  lymph nodes.He denies any weight loss or hemoptysis.   In the ED he was found to have fever up to 102.5,  HR 114, SaO2 of 78% on RA. This improved with oxygen at 4 L [place]. CT scan of the chest with contrast revealed 5.1 x 3.9 cm right hilar lung mass with necrotic appearing surrounding lymph nodes obtained from yesterday. Labs significant for WBC 16.4, hemoglobin 11.5 sodium 133, BUN 24, creatinine 1.52, and lactic acid 2.  Urinalysis was significant for hemoglobin and elevated specific gravity.   Patient met sepsis criteria and blood cultures were obtained.  Patient was given a full fluid bolus with empiric antibiotics of vancomycin, metronidazole, and cefepime.  TRH admitted the patient, and consulted PCCM on 7/9 to evaluate Lung mass for work up for suspected bronchogenic carcinoma vs prostate mets.   Past Medical History   Past Medical History:  Diagnosis Date  . Arthritis   . Cataract   . Chickenpox   . Chronic headaches   . Chronic kidney disease   . Chronic neck and back pain   . History of kidney stones   . Hyperlipidemia   . Hypertension   . Insomnia   . Neuromuscular disorder ([place])   . Prostate cancer [facility])     [place] Hospital Events   09/02/2019 Admission with Sepsis  Consults:  09/03/2019 PCCM  Procedures:    Significant Diagnostic Tests:  09/02/2019 CT Chest with Contrast 1. Ill-defined right hilar lung mass measuring approximately 5.1 x 3.9 cm with multiple pathologically enlarged, necrotic appearing mediastinal and right hilar lymph nodes. 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 2. Indeterminate 2.3 cm left adrenal mass. While this  may represent a benign adenoma, a metastatic lesion is not excluded. 3. Trace pericardial effusion.  Micro Data:  7/9 Blood>> 7/9 Coronavirus Negative>>  Antimicrobials:  6/17 Doxycycline after tele visit 6/30 Levaquin after Urgent Care Visit  Vanc 7/8>> Cefepime 7/9>> Metronidazole 7/9 Interim history/subjective:  Stable on 4L [place] States he has cough and fever.  Objective   Blood pressure 112/61, pulse 97,  temperature 98.4 F (36.9 C), temperature source Oral, resp. rate 16, SpO2 94 %.        Intake/Output Summary (Last 24 hours) at 09/03/2019 1130 Last data filed at 09/03/2019 0605 Gross per 24 hour  Intake 1199.47 ml  Output --  Net 1199.47 ml   There were no vitals filed for this visit.  Examination: General: Elderly male, supine on stretcher, in NAD HENT: NCAT, No LAD, PERRLA Lungs: Bilateral chest excursion, slightly diminished R base, few rhonchi, no wheeze. Cardiovascular: S1, S2, RRR, No RMG Abdomen: Soft, NT, ND, BS +, There is no height or weight on file to calculate BMI. Extremities: No obvious deformities, warm and dry to touch, brisk capillary refill Neuro: A&O x 3, MAE x 4, appropriate GU: NA  Resolved Hospital Problem list     Assessment & Plan:  Sepsis in setting of 5.1 x 3.9 cm right hilar mass with multiple pathologic and necrotic appearing lymph nodes. Concern for post obstructive pneumonia/ bronchogenic carcinoma Currently hemodynamically stable Plan - Per Primary Team - Follow Micro( Blood Urine Sputum) - continue Empiric antibiotics vancomycin, metronidazole, cefepime - MAP Goal of > 65  Acute Respiratory Failure  With Hypoxia  Plan - Titrate oxygen to maintain oxygen sats of > 92% - Trend CXR - Aggressive Pulmonary Toilet ( IS, OOB to chair) - Mucinex as mucolytic - Will need ambulatory saturation prior to discharge , may need home O2 - Will need close follow up with Pulmonary as OP  5.1 x 3.9 cm right hilar mass with multiple pathologic and necrotic appearing lymph nodes Remote history of tobacco abuse ( Quit 40 years ago with a 3 pack year smoking history) 2.3 cm  Left Adrenal Mass>> indeterminate History of Prostate cancer ( radical retropubic prostatectomy in May 1999, subsequently salvage radiation therapy after PSAs noted to increase in 2006, and most recently administered Eligard in 2021.) History of chronic back pain ? Concern for possible  prostate mets Plan Will need tissue sampling once he has recovered from his sepsis  either by  bronch or adrenal gland biopsy  to determine if primary bronchogenic vs prostate mets. Will need PET scan as OP  Acute on Chronic Renal Failure ( CKD III) Plan Trend BMET Replete electrolytes and replete as needed IVF per primary team Hold hydrochlorothiazide   Best practice:  Diet: Per Primary Team Pain/Anxiety/Delirium protocol (if indicated):  VAP protocol (if indicated): NA DVT prophylaxis: Lovenox GI prophylaxis: Per Primary Glucose control: Per Primary Mobility: OOB to chair Code Status:Full Family Communication: Pt. Updated at bedside Disposition: Inpatient. Level of care per Primary team  Labs   CBC: Recent Labs  Lab 09/02/19 1002 09/02/19 1711  WBC 16.9* 16.4*  NEUTROABS 13.3* 11.9*  HGB 11.3* 11.5*  HCT 34.4* 35.5*  MCV 80.6 81.4  PLT 358.0 825    Basic Metabolic Panel: Recent Labs  Lab 09/02/19 1002 09/02/19 1711  NA 132* 133*  K 3.8 3.7  CL 95* 97*  CO2 28 21*  GLUCOSE 130* 131*  BUN 29* 24*  CREATININE 1.52* 1.51*  CALCIUM 9.1 8.6*  GFR: Estimated Creatinine Clearance: 51 mL/min (A) (by C-G formula based on SCr of 1.51 mg/dL (H)). Recent Labs  Lab 09/02/19 1002 09/02/19 1711 09/03/19 0236 09/03/19 0624 09/03/19 0906  WBC 16.9* 16.4*  --   --   --   LATICACIDVEN  --   --  2.0* 1.6 1.2    Liver Function Tests: Recent Labs  Lab 09/02/19 1711  AST 21  ALT 19  ALKPHOS 66  BILITOT 1.0  PROT 6.8  ALBUMIN 3.1*   No results for input(s): LIPASE, AMYLASE in the last 168 hours. No results for input(s): AMMONIA in the last 168 hours.  ABG No results found for: PHART, PCO2ART, PO2ART, HCO3, TCO2, ACIDBASEDEF, O2SAT   Coagulation Profile: No results for input(s): INR, PROTIME in the last 168 hours.  Cardiac Enzymes: No results for input(s): CKTOTAL, CKMB, CKMBINDEX, TROPONINI in the last 168 hours.  HbA1C: Hgb A1c MFr Bld  Date/Time  Value Ref Range Status  12/25/2018 12:20 PM 6.2 4.6 - 6.5 % Final    Comment:    Glycemic Control Guidelines for People with Diabetes:Non Diabetic:  <6%Goal of Therapy: <7%Additional Action Suggested:  >8%   12/22/2017 01:03 PM 6.1 4.6 - 6.5 % Final    Comment:    Glycemic Control Guidelines for People with Diabetes:Non Diabetic:  <6%Goal of Therapy: <7%Additional Action Suggested:  >8%     CBG: No results for input(s): GLUCAP in the last 168 hours.  Review of Systems:    Gen: Denies + fever, + chills, No weight change, + fatigue, night sweats HEENT: Denies blurred vision, double vision, hearing loss, tinnitus, sinus congestion, rhinorrhea, sore throat, neck stiffness, dysphagia PULM: +  shortness of breath, + cough, + sputum production, No hemoptysis, + wheezing CV: Denies chest pain, edema, orthopnea, paroxysmal nocturnal dyspnea, palpitations GI: Denies abdominal pain, nausea, vomiting, diarrhea, hematochezia, melena, constipation, change in bowel habits GU: Denies dysuria, hematuria, polyuria, oliguria, urethral discharge Endocrine: Denies hot or cold intolerance, polyuria, polyphagia or appetite change Derm: Denies rash, dry skin, scaling or peeling skin change Heme: Denies easy bruising, bleeding, bleeding gums Neuro: Denies headache, numbness, weakness, slurred speech, loss of memory or consciousness  Past Medical History  He,  has a past medical history of Arthritis, Cataract, Chickenpox, Chronic headaches, Chronic kidney disease, Chronic neck and back pain, History of kidney stones, Hyperlipidemia, Hypertension, Insomnia, Neuromuscular disorder ([place]), and Prostate cancer ([place]).   Surgical History    Past Surgical History:  Procedure Laterality Date  . BACK SURGERY  1974  . NECK SURGERY  2002  . PROSTATECTOMY    . TONSILLECTOMY  1958  . TRIGGER FINGER RELEASE Left 11/20/2017   left thumb/Dr. Apolonio Schneiders     Social History   reports that he has quit smoking. He has a 3.30  pack-year smoking history. He has never used smokeless tobacco. He reports previous alcohol use. He reports that he does not use drugs.   Family History   His family history includes Arthritis in his father and mother; Hyperlipidemia in his father; Hypertension in his father; Lung cancer in his father and mother; Prostate cancer in his brother; Stomach cancer in his maternal grandmother; Stroke in his father. There is no history of Colon cancer, Esophageal cancer, Pancreatic cancer, or Rectal cancer.   Allergies No Known Allergies   Home Medications  Prior to Admission medications   Medication Sig Start Date End Date Taking? Authorizing Provider  albuterol (VENTOLIN HFA) 108 (90 Base) MCG/ACT inhaler Inhale 2 puffs  into the lungs every 4 (four) hours as needed for wheezing or shortness of breath. 08/25/19  Yes Rodriguez-Southworth, Sunday Spillers, PA-C  amitriptyline (ELAVIL) 75 MG tablet Take 1 tablet (75 mg total) by mouth at bedtime. 10/26/18  Yes Dohmeier, Asencion Partridge, MD  amLODipine (NORVASC) 10 MG tablet TAKE 1 TABLET BY MOUTH EVERY DAY Patient taking differently: Take 10 mg by mouth daily.  04/13/19  Yes Pleas Koch, NP  aspirin EC 81 MG tablet Take 81 mg by mouth daily.    Yes [provider]  atorvastatin (LIPITOR) 20 MG tablet TAKE 1 TABLET BY MOUTH EVERY DAY Patient taking differently: Take 20 mg by mouth daily.  05/26/19  Yes Pleas Koch, NP  hydrochlorothiazide (HYDRODIURIL) 25 MG tablet TAKE 1 TABLET BY MOUTH EVERY DAY FOR BLOOD PRESSURE Patient taking differently: Take 25 mg by mouth daily.  08/20/19  Yes Pleas Koch, NP  LYRICA 100 MG capsule Take 100 mg by mouth 2 (two) times daily.  10/10/16  Yes [provider]  Omega-3 Fatty Acids ([employer] FISH OIL) 1000 MG CAPS TAKE 1 CAPSULE BY MOUTH EVERY DAY WITH A MEAL Patient taking differently: Take 1,000 mg by mouth daily. TAKE 1 CAPSULE BY MOUTH EVERY DAY WITH A MEAL 03/06/18  Yes Pleas Koch, NP    oxyCODONE-acetaminophen (PERCOCET) 10-325 MG per tablet Take 1 tablet by mouth as needed for pain (up to five times daily).  07/29/12  Yes [provider]  tiZANidine (ZANAFLEX) 4 MG tablet TAKE 1 TABLET BY MOUTH TWICE A DAY Patient taking differently: Take 4 mg by mouth 2 (two) times daily. TAKE 1 TABLET BY MOUTH TWICE A DAY 10/26/18  Yes Dohmeier, Asencion Partridge, MD  zolpidem (AMBIEN) 10 MG tablet Take 1 tablet (10 mg total) by mouth at bedtime as needed. 10/26/18  Yes Dohmeier, Asencion Partridge, MD  levofloxacin (LEVAQUIN) 500 MG tablet Take 1 tablet (500 mg total) by mouth daily. 08/25/19   Rodriguez-Southworth, Sunday Spillers, PA-C  predniSONE (DELTASONE) 20 MG tablet One bid x 3 days, then one qd x 3 Patient not taking: Reported on 09/03/2019 08/25/19   Rodriguez-Southworth, Sunday Spillers, PA-C     Critical care time: 45 minutes    Magdalen Spatz, MSN, AGACNP-BC [place] for personal pager PCCM on call pager [phone] 09/03/2019 11:30 AM

## 2019-09-03 NOTE — Progress Notes (Signed)
Pharmacy Antibiotic Note  Jerry Wong is a 74 y.o. male admitted on 09/02/2019 with sepsis.  Pharmacy has been consulted for vancomycin and cefepime dosing. Pt is febrile with Tmax 102.5 and WBC is elevated. Lactic acid has improved to 1.6. SCr is slightly above baseline at 1.51.   Plan: Vancomycin 750mg  IV Q12H Cefepime 2gm IV Q12H F/u renal fxn, C&S, clinical status and trough at SS    Temp (24hrs), Avg:99.6 F (37.6 C), Min:97.8 F (36.6 C), Max:102.5 F (39.2 C)  Recent Labs  Lab 09/02/19 1002 09/02/19 1711 09/03/19 0236 09/03/19 0624  WBC 16.9* 16.4*  --   --   CREATININE 1.52* 1.51*  --   --   LATICACIDVEN  --   --  2.0* 1.6    Estimated Creatinine Clearance: 51 mL/min (A) (by C-G formula based on SCr of 1.51 mg/dL (H)).    No Known Allergies  Antimicrobials this admission: Vanc 7/9>> Cefepime 7/9>> Flagyl 7/9>>  Dose adjustments this admission: N/A  Microbiology results: Pending  Thank you for allowing pharmacy to be a part of this patient's care.  Nijee Heatwole, Rande Lawman 09/03/2019 7:37 AM

## 2019-09-03 NOTE — ED Notes (Signed)
Patient transported to CT 

## 2019-09-03 NOTE — TOC Initial Note (Signed)
Transition of Care [facility]) - Initial/Assessment Note    Patient Details  Name: Jerry Wong MRN: [id number] Date of Birth: [birth year]  Transition of [facility]) CM/SW Contact:    Verdell Carmine, RN Phone Number: 09/03/2019, 5:50 PM  Clinical Narrative:                 Admitted with primary lung adenopathy, has hx of prostate CA. EBUS scheduled for Monday afternoon.  Will need evaluation closer to discharge in regard to home O2 qualifications. When he came in  sats were in the 70's  Expected Discharge Plan: Home/Self Care Barriers to Discharge: Continued Medical Work up   Patient Goals and CMS Choice        Expected Discharge Plan and Services Expected Discharge Plan: Home/Self Care                                              Prior Living Arrangements/Services     Patient language and need for interpreter reviewed:: Yes        Need for Family Participation in Patient Care: Yes (Comment) Care giver support system in place?: Yes (comment)   Criminal Activity/Legal Involvement Pertinent to Current Situation/Hospitalization: No - Comment as needed  Activities of Daily Living      Permission Sought/Granted                  Emotional Assessment       Orientation: : Oriented to  Time, Oriented to Place, Oriented to Self Alcohol / Substance Use: Not Applicable Psych Involvement: No (comment)  Admission diagnosis:  AKI (acute kidney injury) ([place]) [N17.9] Sepsis ([place]) [A41.9] Sepsis without acute organ dysfunction, due to unspecified organism [facility]) [A41.9] Patient Active Problem List   Diagnosis Date Noted  . Sepsis ([place]) 09/03/2019  . Acute kidney injury superimposed on chronic kidney disease ([place]) 09/03/2019  . Acute respiratory failure with hypoxia ([place]) 09/03/2019  . Cough 09/02/2019  . Decreased renal function 01/15/2019  . Chronic tension-type headache, not intractable 10/26/2018  . Edema of both ankles 10/26/2018  . Insomnia secondary to  chronic pain 12/29/2017  . Prostate cancer ([place]) 12/10/2016  . Prediabetes 12/10/2016  . Preventative health care 12/10/2016  . Essential hypertension 06/03/2016  . Hyperlipidemia 06/03/2016  . Medication monitoring encounter 08/24/2013  . Chronic headaches 08/21/2012  . Chronic neck and back pain 08/21/2012   PCP:  Pleas Koch, NP Pharmacy:   [id number] - [place], [address] [place] [address] Phone: [phone] Fax: [phone]     Social Determinants of Health (SDOH) Interventions    Readmission Risk Interventions No flowsheet data found.

## 2019-09-03 NOTE — ED Notes (Signed)
Lunch Tray Ordered @ 1049. °

## 2019-09-03 NOTE — H&P (Signed)
History and Physical    Jerry Wong ZOX:[id number] DOB: Feb 14, 1946 DOA: 09/02/2019  Referring MD/NP/PA: Joseph Berkshire, MD PCP: Pleas Koch, NP  Patient coming from: Home   Chief Complaint: Fever and cough  I have personally briefly reviewed patient's old medical records in [address]   HPI: Jerry Wong is a 74 y.o. male with medical history significant of hypertension, hyperlipidemia, nephrolithiasis, neuropathy, prostate cancer s/p prostatectomy/salvage radiation/Eligard, and remote history of tobacco use presents with complaints of intermittent fever and cough of the last 2-3 weeks.  He reports that the cough is nonproductive.  At home he had been taking over-the-counter medicines with temporary relief of symptoms.  He was seen by his primary care provider via televisit on 6/17 and was prescribed doxycycline at that time.  Despite this patient continued to have symptoms.  Reported dyspnea on exertion along with complaints of wheezing.  Patient was seen in urgent care on 6/30 and found right lower lobe lung mass at that time.  He was prescribed prednisone Levaquin without any change in symptoms.  His primary care provider had placed a  referral for a CT scan of the chest to evaluate the lung mass.  The CT scan and follow-up visit with his primary care provider were done yesterday.  He was advised to come to the hospital for further evaluation and treatment following his appointment.  He denies having any chest pain, nausea, vomiting, diarrhea, abdominal pain, change in weight, bleeding in stool/urine, or change in appetite.  Over the last few days patient reports that he has not been sleeping well and currently complains of a headache.  Patient reports that he previous smoking 1/3 pack of cigarettes per day on average from the age of 60 to about 84.  He reports not normally being on oxygen at baseline.  ED Course: Upon admission into the emergency department patient was seen to be  febrile up to 102.5 F, pulse elevated up to 114, O2 saturations as low as 78% improved on 4 L nasal cannula oxygen, and all other vital signs maintained.  CT scan of the chest with contrast revealed 5.1 x 3.9 cm right hilar lung mass with necrotic appearing surrounding lymph nodes obtained from yesterday. Labs significant for WBC 16.4, hemoglobin 11.5 sodium 133, BUN 24, creatinine 1.52, and lactic acid 2.  COVID-19 screening was negative.  Urinalysis was significant for hemoglobin and elevated specific gravity.   Patient met sepsis criteria and blood cultures were obtained.  Patient was given a full fluid bolus with empiric antibiotics of vancomycin, metronidazole, and cefepime.  TRH called to admit.  Review of Systems  Constitutional: Positive for fever. Negative for weight loss.  HENT: Negative for ear discharge and nosebleeds.   Eyes: Negative for photophobia and pain.  Respiratory: Positive for cough, shortness of breath and wheezing. Negative for hemoptysis and sputum production.   Cardiovascular: Positive for leg swelling. Negative for chest pain.  Gastrointestinal: Negative for abdominal pain, diarrhea, nausea and vomiting.  Genitourinary: Negative for dysuria and frequency.  Musculoskeletal: Positive for back pain. Negative for myalgias.  Skin: Negative for itching and rash.  Neurological: Positive for sensory change (Chronic) and headaches. Negative for loss of consciousness.  Endo/Heme/Allergies: Negative for polydipsia.  Psychiatric/Behavioral: Negative for substance abuse. The patient has insomnia.     Past Medical History:  Diagnosis Date  . Arthritis   . Cataract   . Chickenpox   . Chronic headaches   . Chronic kidney disease   .  Chronic neck and back pain   . History of kidney stones   . Hyperlipidemia   . Hypertension   . Insomnia   . Neuromuscular disorder ([place])   . Prostate cancer [facility])     Past Surgical History:  Procedure Laterality Date  . BACK SURGERY  1974    . NECK SURGERY  2002  . PROSTATECTOMY    . TONSILLECTOMY  1958  . TRIGGER FINGER RELEASE Left 11/20/2017   left thumb/Dr. Apolonio Schneiders     reports that he has quit smoking. He has never used smokeless tobacco. He reports previous alcohol use. He reports that he does not use drugs.  No Known Allergies  Family History  Problem Relation Age of Onset  . Arthritis Mother   . Lung cancer Mother   . Arthritis Father   . Lung cancer Father   . Hyperlipidemia Father   . Hypertension Father   . Stroke Father   . Prostate cancer Brother   . Stomach cancer Maternal Grandmother   . Colon cancer Neg Hx   . Esophageal cancer Neg Hx   . Pancreatic cancer Neg Hx   . Rectal cancer Neg Hx     Prior to Admission medications   Medication Sig Start Date End Date Taking? Authorizing Provider  albuterol (VENTOLIN HFA) 108 (90 Base) MCG/ACT inhaler Inhale 2 puffs into the lungs every 4 (four) hours as needed for wheezing or shortness of breath. 08/25/19   Rodriguez-Southworth, Sunday Spillers, PA-C  amitriptyline (ELAVIL) 75 MG tablet Take 1 tablet (75 mg total) by mouth at bedtime. 10/26/18   Dohmeier, Asencion Partridge, MD  amLODipine (NORVASC) 10 MG tablet TAKE 1 TABLET BY MOUTH EVERY DAY 04/13/19   Pleas Koch, NP  aspirin EC 81 MG tablet Take 81 mg by mouth daily.     [provider]  atorvastatin (LIPITOR) 20 MG tablet TAKE 1 TABLET BY MOUTH EVERY DAY 05/26/19   Pleas Koch, NP  hydrochlorothiazide (HYDRODIURIL) 25 MG tablet TAKE 1 TABLET BY MOUTH EVERY DAY FOR BLOOD PRESSURE 08/20/19   Pleas Koch, NP  levofloxacin (LEVAQUIN) 500 MG tablet Take 1 tablet (500 mg total) by mouth daily. 08/25/19   Rodriguez-Southworth, Sunday Spillers, PA-C  LYRICA 100 MG capsule TAKE 1 CAPSULE BY MOUTH TWICE A DAY AS DIRECTED 10/10/16   [provider]  Omega-3 Fatty Acids ([employer] FISH OIL) 1000 MG CAPS TAKE 1 CAPSULE BY MOUTH EVERY DAY WITH A MEAL 03/06/18   Pleas Koch, NP  oxyCODONE-acetaminophen (PERCOCET)  10-325 MG per tablet Take 1 tablet by mouth as needed for pain.  07/29/12   [provider]  predniSONE (DELTASONE) 20 MG tablet One bid x 3 days, then one qd x 3 08/25/19   Rodriguez-Southworth, Sunday Spillers, PA-C  tiZANidine (ZANAFLEX) 4 MG tablet TAKE 1 TABLET BY MOUTH TWICE A DAY 10/26/18   Dohmeier, Asencion Partridge, MD  zolpidem (AMBIEN) 10 MG tablet Take 1 tablet (10 mg total) by mouth at bedtime as needed. 10/26/18   Dohmeier, Asencion Partridge, MD    Physical Exam:  Constitutional: Elderly male appears to be in no acute distress at this time Vitals:   09/03/19 0415 09/03/19 0445 09/03/19 0643 09/03/19 0659  BP: (!) 141/70 119/68 130/66 131/69  Pulse: 90 97 (!) 101 98  Resp: _0 Temp:      TempSrc:      SpO2: 93% 94% 90% 95%   Eyes: PERRL, lids and conjunctivae normal ENMT: Mucous membranes are dry. Posterior pharynx  clear of any exudate or lesions.  Neck: normal, supple, no masses, no thyromegaly Respiratory: Normal respiratory effort with positive crackles heard on the right mid lung field.  No rhonchi appreciated.  Patient currently on 4 L of oxygen and we will talk in complete sentences.  Cardiovascular: Regular rate and rhythm, no murmurs / rubs / gallops.  Trace to 1+ pitting bilateral lower extremity edema. 2+ pedal pulses. No carotid bruits.  Abdomen: no tenderness, no masses palpated. No hepatosplenomegaly. Bowel sounds positive.  No CVA tenderness. Musculoskeletal: no clubbing / cyanosis. No joint deformity upper and lower extremities. Good ROM, no contractures. Normal muscle tone.  Skin: no rashes, lesions, ulcers. No induration Neurologic: CN 2-12 grossly intact. Sensation intact, DTR normal. Strength 5/5 in all 4.  Psychiatric: Normal judgment and insight. Alert and oriented x 3. Normal mood.     Labs on Admission: I have personally reviewed following labs and imaging studies  CBC: Recent Labs  Lab 09/02/19 1002 09/02/19 1711  WBC 16.9* 16.4*  NEUTROABS 13.3* 11.9*  HGB  11.3* 11.5*  HCT 34.4* 35.5*  MCV 80.6 81.4  PLT 358.0 631   Basic Metabolic Panel: Recent Labs  Lab 09/02/19 1002 09/02/19 1711  NA 132* 133*  K 3.8 3.7  CL 95* 97*  CO2 28 21*  GLUCOSE 130* 131*  BUN 29* 24*  CREATININE 1.52* 1.51*  CALCIUM 9.1 8.6*   GFR: Estimated Creatinine Clearance: 51 mL/min (A) (by C-G formula based on SCr of 1.51 mg/dL (H)). Liver Function Tests: Recent Labs  Lab 09/02/19 1711  AST 21  ALT 19  ALKPHOS 66  BILITOT 1.0  PROT 6.8  ALBUMIN 3.1*   No results for input(s): LIPASE, AMYLASE in the last 168 hours. No results for input(s): AMMONIA in the last 168 hours. Coagulation Profile: No results for input(s): INR, PROTIME in the last 168 hours. Cardiac Enzymes: No results for input(s): CKTOTAL, CKMB, CKMBINDEX, TROPONINI in the last 168 hours. BNP (last 3 results) No results for input(s): PROBNP in the last 8760 hours. HbA1C: No results for input(s): HGBA1C in the last 72 hours. CBG: No results for input(s): GLUCAP in the last 168 hours. Lipid Profile: No results for input(s): CHOL, HDL, LDLCALC, TRIG, CHOLHDL, LDLDIRECT in the last 72 hours. Thyroid Function Tests: No results for input(s): TSH, T4TOTAL, FREET4, T3FREE, THYROIDAB in the last 72 hours. Anemia Panel: No results for input(s): VITAMINB12, FOLATE, FERRITIN, TIBC, IRON, RETICCTPCT in the last 72 hours. Urine analysis:    Component Value Date/Time   COLORURINE YELLOW 09/03/2019 0426   APPEARANCEUR CLEAR 09/03/2019 0426   LABSPEC 1.035 (H) 09/03/2019 0426   PHURINE 5.0 09/03/2019 0426   GLUCOSEU NEGATIVE 09/03/2019 0426   HGBUR MODERATE (A) 09/03/2019 0426   BILIRUBINUR NEGATIVE 09/03/2019 0426   KETONESUR NEGATIVE 09/03/2019 0426   PROTEINUR NEGATIVE 09/03/2019 0426   NITRITE NEGATIVE 09/03/2019 0426   LEUKOCYTESUR NEGATIVE 09/03/2019 0426   Sepsis Labs: Recent Results (from the past 240 hour(s))  SARS Coronavirus 2 by RT PCR (hospital order, performed in [facility]  hospital lab) Nasopharyngeal Nasopharyngeal Swab     Status: None   Collection Time: 09/03/19  4:13 AM   Specimen: Nasopharyngeal Swab  Result Value Ref Range Status   SARS Coronavirus 2 NEGATIVE NEGATIVE Final    Comment: (NOTE) SARS-CoV-2 target nucleic acids are NOT DETECTED.  The SARS-CoV-2 RNA is generally detectable in upper and lower respiratory specimens during the acute phase of infection. The lowest concentration of SARS-CoV-2 viral  copies this assay can detect is 250 copies / mL. A negative result does not preclude SARS-CoV-2 infection and should not be used as the sole basis for treatment or other patient management decisions.  A negative result may occur with improper specimen collection / handling, submission of specimen other than nasopharyngeal swab, presence of viral mutation(s) within the areas targeted by this assay, and inadequate number of viral copies (<250 copies / mL). A negative result must be combined with clinical observations, patient history, and epidemiological information.  Fact Sheet for Patients:   StrictlyIdeas.no  Fact Sheet for Healthcare Providers: BankingDealers.co.za  This test is not yet approved or  cleared by the [place] FDA and has been authorized for detection and/or diagnosis of SARS-CoV-2 by FDA under an Emergency Use Authorization (EUA).  This EUA will remain in effect (meaning this test can be used) for the duration of the COVID-19 declaration under Section 564(b)(1) of the Act, 21 U.S.C. section 360bbb-3(b)(1), unless the authorization is terminated or revoked sooner.  Performed at [facility], [place] [address] [place], [place] [address]      Radiological Exams on Admission: CT Chest W Contrast  Result Date: 09/02/2019 CLINICAL DATA:  Daily fevers. Shortness of breath x3 weeks. Abnormal chest x-ray. EXAM: CT CHEST WITH CONTRAST TECHNIQUE: Multidetector CT imaging of the  chest was performed during intravenous contrast administration. CONTRAST:  10m OMNIPAQUE IOHEXOL 300 MG/ML  SOLN COMPARISON:  Chest x-ray dated August 25, 2019 FINDINGS: Cardiovascular: There are atherosclerotic changes of the thoracic aorta without evidence for dissection or aneurysm. The main pulmonary artery is not significantly dilated. There is no large centrally located pulmonary embolism. Detection of smaller pulmonary emboli is severely limited by contrast bolus timing. There are coronary artery calcifications. There is a trace pericardial effusion. The heart size is normal. Mediastinum/Nodes: --there are enlarged mediastinal lymph nodes. For example there is a necrotic appearing subcarinal lymph node measuring 2.3 cm (axial series 2, image 83). Additional enlarged mediastinal lymph nodes are noted. --multiple pathologically enlarged, necrotic right hilar lymph nodes are noted. -- No axillary lymphadenopathy. -- No supraclavicular lymphadenopathy. -- Normal thyroid gland where visualized. -  Unremarkable esophagus. Lungs/Pleura: There is an ill-defined right hilar lung mass measuring approximately 5.1 x 3.9 cm (axial series 2, image 96). There may be postobstructive pneumonia in the right lower lobe. There is interlobular septal thickening involving the right lower lobe. There is atelectasis at the left lung base. Upper Abdomen: There is a left adrenal mass measuring approximately 2.3 cm (axial series 2, image 160). Multiple renal cysts are noted bilaterally. Musculoskeletal: No chest wall abnormality. No bony spinal canal stenosis. IMPRESSION: 1. Ill-defined right hilar lung mass measuring approximately 5.1 x 3.9 cm with multiple pathologically enlarged, necrotic appearing mediastinal and right hilar lymph nodes. 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 2. Indeterminate 2.3 cm left adrenal mass. While this may represent a benign adenoma, a metastatic lesion is not excluded. 3. Trace pericardial  effusion. Aortic Atherosclerosis (ICD10-I70.0). Electronically Signed   By: CConstance HolsterM.D.   On: 09/02/2019 16:07   DG Chest Port 1 View  Result Date: 09/03/2019 CLINICAL DATA:  Cough and fevers, EXAM: PORTABLE CHEST 1 VIEW COMPARISON:  CT from the previous day. FINDINGS: Cardiac shadow is within normal limits. Aortic calcifications are noted. The left lung is clear. Right hilar and infrahilar mass lesion is noted similar to that noted on prior CT examination. No other focal abnormality is seen. IMPRESSION: Stable right hilar and  infrahilar mass lesion similar to that seen on prior CT from 1 day previous. Electronically Signed   By: Inez Catalina M.D.   On: 09/03/2019 02:26    EKG: Independently reviewed.  Sinus tachycardia 109 bpm with signs of LVH.  Assessment/Plan Sepsis: Acute.  Patient presented with complaints of cough and intermittent fevers.  Found to be febrile up to 102.5 F with tachycardia, WBC 16.4, and lactic acid initially 2.  CT scan of the chest noting a 5.1 x 3.9 cm right hilar mass with multiple pathologic and necrotic appearing lymph nodes.  Previously treated with doxycycline and then Levaquin without improvement in symptoms.  Patient had received full fluid bolus with empiric antibiotics of vancomycin, cefepime, and metronidazole.  Repeat lactic acid was 1.6.  Concern for possible postobstructive pneumonia -Admit to medical telemetry bed -Sepsis protocol initiated -Follow-up blood, urine, and sputum studies/culture -Continue empiric antibiotics vancomycin, metronidazole, cefepime -Mucinex -Tylenol as needed -Recheck CBC in a.m.  Acute respiratory failure with hypoxia: Patient presented was found to be hypoxic down to 78% on room air requiring placement to 4 L of nasal cannula oxygen. 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  -PCCM formally consulted, will follow-up for further recommendations  Acute kidney injury superimposed on chronic kidney disease IIIa: Patient baseline creatinine previously noted to be around 1.2.  Patient presents with creatinine elevated to 1.51 with BUN 24.  Urinalysis positive for hemoglobin and elevated specific gravity.  Considering patient on diuretics of  hydrochlorothiazide and the elevated specific gravity seen on urinalysis suspect prerenal cause of symptoms. -Continue IV fluids normal saline at 75 mL/h -Recheck kidney function in a.m.  Normocytic normochromic anemia: Acute.  Hemoglobin 11.5 with MCV and MCH at the lower limits of normal.  He denies any reports of bleeding, but noted to have hematuria on urinalysis. -Continue to monitor  Hyponatremia: Acute.  Sodium 133 on admission.  Suspect a hypovolemic hyponatremia. -Continue to monitor  Essential hypertension: Blood pressures currently maintained.  Home medications include amlodipine 10 mg daily and hydrochlorothiazide 25 mg every day for blood pressure. -Hold hydrochlorothiazide due to AKI -Continue amlodipine  Hyperlipidemia: Home medications include atorvastatin 20 mg daily. 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 -Consider work-up while working up for patient's lung cancer  Historyof prostate cancer: Prostate cancer initially diagnosed when patient was in his 89s.  Patient underwent radical retropubic prostatectomy in May 1999, subsequently salvage radiation therapy after PSAs noted to increase in 2006, and most recently administered Eligard in 2021. -Continue outpatient follow-up with [facility]rology at [facility]  Neuropathy, back pain: Chronic.  Patient with a long history of back and neck pain per review of records.  Currently denies any complaints of pain. -Continue Lyrica and oxycodone as needed  Insomnia: Home medications include Ambien 10 mg  nightly. -Continue Ambien   DVT prophylaxis: Lovenox Code Status: Full Family Communication: No family requested to be updated at this time Disposition Plan: Possible discharge home in 2 to 3 days Consults called: none   Admission status: inpatient   Norval Morton MD [facility] Pager [phone]   If 7PM-7AM, please contact night-coverage [email or web address] Password [email or web address]  09/03/2019, 7:14 AM

## 2019-09-03 NOTE — ED Notes (Signed)
Pt returned from CT scan in stable condition with IV displaced. Site cleaned, pressure applied and dressed at this time.

## 2019-09-03 NOTE — Plan of Care (Signed)

## 2019-09-03 NOTE — ED Provider Notes (Addendum)
[facility] EMERGENCY DEPARTMENT Provider Note   CSN: [id number] Arrival date & time: 09/02/19  1642     History Chief Complaint  Patient presents with  . Dehydration    Jerry Wong is a 74 y.o. male.  Patient referred to the emergency department by primary care doctor for IV antibiotics.  Patient has been ill for 2 weeks.  Patient reports that he has been running a fever and has had a cough.  He has been on multiple different antibiotics without improvement.  He recently had a mass found in his right lung which is felt to be consistent with lung cancer, has not started treatments.  Patient reports that he went to his doctor today and had some blood work drawn.  His doctor called him and told him that the numbers looked bad and he should go to the ER for IV antibiotics and IV fluids.  Patient denies urinary symptoms, chest pain, abdominal pain, nausea, vomiting, diarrhea.  No unusual rash.        Past Medical History:  Diagnosis Date  . Arthritis   . Cataract   . Chickenpox   . Chronic headaches   . Chronic kidney disease   . Chronic neck and back pain   . History of kidney stones   . Hyperlipidemia   . Hypertension   . Insomnia   . Neuromuscular disorder ([place])   . Prostate cancer [facility])     Patient Active Problem List   Diagnosis Date Noted  . Cough 09/02/2019  . Decreased renal function 01/15/2019  . Chronic tension-type headache, not intractable 10/26/2018  . Edema of both ankles 10/26/2018  . Insomnia secondary to chronic pain 12/29/2017  . Prostate cancer ([place]) 12/10/2016  . Prediabetes 12/10/2016  . Preventative health care 12/10/2016  . Essential hypertension 06/03/2016  . Hyperlipidemia 06/03/2016  . Medication monitoring encounter 08/24/2013  . Chronic headaches 08/21/2012  . Chronic neck and back pain 08/21/2012    Past Surgical History:  Procedure Laterality Date  . BACK SURGERY  1974  . NECK SURGERY  2002  . PROSTATECTOMY    .  TONSILLECTOMY  1958  . TRIGGER FINGER RELEASE Left 11/20/2017   left thumb/Dr. Apolonio Schneiders       Family History  Problem Relation Age of Onset  . Arthritis Mother   . Lung cancer Mother   . Arthritis Father   . Lung cancer Father   . Hyperlipidemia Father   . Hypertension Father   . Stroke Father   . Prostate cancer Brother   . Stomach cancer Maternal Grandmother   . Colon cancer Neg Hx   . Esophageal cancer Neg Hx   . Pancreatic cancer Neg Hx   . Rectal cancer Neg Hx     Social History   Tobacco Use  . Smoking status: Former Research scientist (life sciences)  . Smokeless tobacco: Never Used  . Tobacco comment: quit 35-40 years ago per pt  Vaping Use  . Vaping Use: Never used  Substance Use Topics  . Alcohol use: Not Currently  . Drug use: No    Home Medications Prior to Admission medications   Medication Sig Start Date End Date Taking? Authorizing Provider  albuterol (VENTOLIN HFA) 108 (90 Base) MCG/ACT inhaler Inhale 2 puffs into the lungs every 4 (four) hours as needed for wheezing or shortness of breath. 08/25/19   Rodriguez-Southworth, Sunday Spillers, PA-C  amitriptyline (ELAVIL) 75 MG tablet Take 1 tablet (75 mg total) by mouth at bedtime. 10/26/18  Dohmeier, Asencion Partridge, MD  amLODipine (NORVASC) 10 MG tablet TAKE 1 TABLET BY MOUTH EVERY DAY 04/13/19   Pleas Koch, NP  aspirin EC 81 MG tablet Take 81 mg by mouth daily.     [provider]  atorvastatin (LIPITOR) 20 MG tablet TAKE 1 TABLET BY MOUTH EVERY DAY 05/26/19   Pleas Koch, NP  hydrochlorothiazide (HYDRODIURIL) 25 MG tablet TAKE 1 TABLET BY MOUTH EVERY DAY FOR BLOOD PRESSURE 08/20/19   Pleas Koch, NP  levofloxacin (LEVAQUIN) 500 MG tablet Take 1 tablet (500 mg total) by mouth daily. 08/25/19   Rodriguez-Southworth, Sunday Spillers, PA-C  LYRICA 100 MG capsule TAKE 1 CAPSULE BY MOUTH TWICE A DAY AS DIRECTED 10/10/16   [provider]  Omega-3 Fatty Acids ([employer] FISH OIL) 1000 MG CAPS TAKE 1 CAPSULE BY MOUTH EVERY DAY WITH A MEAL  03/06/18   Pleas Koch, NP  oxyCODONE-acetaminophen (PERCOCET) 10-325 MG per tablet Take 1 tablet by mouth as needed for pain.  07/29/12   [provider]  predniSONE (DELTASONE) 20 MG tablet One bid x 3 days, then one qd x 3 08/25/19   Rodriguez-Southworth, Sunday Spillers, PA-C  tiZANidine (ZANAFLEX) 4 MG tablet TAKE 1 TABLET BY MOUTH TWICE A DAY 10/26/18   Dohmeier, Asencion Partridge, MD  zolpidem (AMBIEN) 10 MG tablet Take 1 tablet (10 mg total) by mouth at bedtime as needed. 10/26/18   Dohmeier, Asencion Partridge, MD    Allergies    Patient has no known allergies.  Review of Systems   Review of Systems  Constitutional: Positive for chills and fever.  Respiratory: Positive for cough and shortness of breath.     Physical Exam Updated Vital Signs BP 137/75 (BP Location: Right Arm)   Pulse 90   Temp 98.2 F (36.8 C) (Oral)   Resp 18   SpO2 95%   Physical Exam Vitals and nursing note reviewed.  Constitutional:      General: He is not in acute distress.    Appearance: Normal appearance. He is well-developed.  HENT:     Head: Normocephalic and atraumatic.     Right Ear: Hearing normal.     Left Ear: Hearing normal.     Nose: Nose normal.  Eyes:     Conjunctiva/sclera: Conjunctivae normal.     Pupils: Pupils are equal, round, and reactive to light.  Cardiovascular:     Rate and Rhythm: Regular rhythm.     Heart sounds: S1 normal and S2 normal. No murmur heard.  No friction rub. No gallop.   Pulmonary:     Effort: Pulmonary effort is normal. No respiratory distress.     Breath sounds: Normal breath sounds.  Chest:     Chest wall: No tenderness.  Abdominal:     General: Bowel sounds are normal.     Palpations: Abdomen is soft.     Tenderness: There is no abdominal tenderness. There is no guarding or rebound. Negative signs include Murphy's sign and McBurney's sign.     Hernia: No hernia is present.  Musculoskeletal:        General: Normal range of motion.     Cervical back: Normal range  of motion and neck supple.  Skin:    General: Skin is warm and dry.     Findings: No rash.  Neurological:     Mental Status: He is alert and oriented to person, place, and time.     GCS: GCS eye subscore is 4. GCS verbal subscore is 5. GCS  motor subscore is 6.     Cranial Nerves: No cranial nerve deficit.     Sensory: No sensory deficit.     Coordination: Coordination normal.  Psychiatric:        Speech: Speech normal.        Behavior: Behavior normal.        Thought Content: Thought content normal.     ED Results / Procedures / Treatments   Labs (all labs ordered are listed, but only abnormal results are displayed) Labs Reviewed  CBC WITH DIFFERENTIAL/PLATELET - Abnormal; Notable for the following components:      Result Value   WBC 16.4 (*)    Hemoglobin 11.5 (*)    HCT 35.5 (*)    Neutro Abs 11.9 (*)    Monocytes Absolute 1.9 (*)    Abs Immature Granulocytes 0.50 (*)    All other components within normal limits  COMPREHENSIVE METABOLIC PANEL - Abnormal; Notable for the following components:   Sodium 133 (*)    Chloride 97 (*)    CO2 21 (*)    Glucose, Bld 131 (*)    BUN 24 (*)    Creatinine, Ser 1.51 (*)    Calcium 8.6 (*)    Albumin 3.1 (*)    GFR calc non Af Amer 46 (*)    GFR calc Af Amer 53 (*)    All other components within normal limits  URINE CULTURE  CULTURE, BLOOD (ROUTINE X 2)  CULTURE, BLOOD (ROUTINE X 2)  SARS CORONAVIRUS 2 BY RT PCR (HOSPITAL ORDER, [facility])  LACTIC ACID, PLASMA  URINALYSIS, ROUTINE W REFLEX MICROSCOPIC    EKG None  Radiology CT Chest W Contrast  Result Date: 09/02/2019 CLINICAL DATA:  Daily fevers. Shortness of breath x3 weeks. Abnormal chest x-ray. EXAM: CT CHEST WITH CONTRAST TECHNIQUE: Multidetector CT imaging of the chest was performed during intravenous contrast administration. CONTRAST:  35mL OMNIPAQUE IOHEXOL 300 MG/ML  SOLN COMPARISON:  Chest x-ray dated August 25, 2019 FINDINGS:  Cardiovascular: There are atherosclerotic changes of the thoracic aorta without evidence for dissection or aneurysm. The main pulmonary artery is not significantly dilated. There is no large centrally located pulmonary embolism. Detection of smaller pulmonary emboli is severely limited by contrast bolus timing. There are coronary artery calcifications. There is a trace pericardial effusion. The heart size is normal. Mediastinum/Nodes: --there are enlarged mediastinal lymph nodes. For example there is a necrotic appearing subcarinal lymph node measuring 2.3 cm (axial series 2, image 83). Additional enlarged mediastinal lymph nodes are noted. --multiple pathologically enlarged, necrotic right hilar lymph nodes are noted. -- No axillary lymphadenopathy. -- No supraclavicular lymphadenopathy. -- Normal thyroid gland where visualized. -  Unremarkable esophagus. Lungs/Pleura: There is an ill-defined right hilar lung mass measuring approximately 5.1 x 3.9 cm (axial series 2, image 96). There may be postobstructive pneumonia in the right lower lobe. There is interlobular septal thickening involving the right lower lobe. There is atelectasis at the left lung base. Upper Abdomen: There is a left adrenal mass measuring approximately 2.3 cm (axial series 2, image 160). Multiple renal cysts are noted bilaterally. Musculoskeletal: No chest wall abnormality. No bony spinal canal stenosis. IMPRESSION: 1. Ill-defined right hilar lung mass measuring approximately 5.1 x 3.9 cm with multiple pathologically enlarged, necrotic appearing mediastinal and right hilar lymph nodes. 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 2. Indeterminate 2.3 cm left adrenal mass. While this may represent a benign adenoma, a metastatic lesion is not excluded. 3.  Trace pericardial effusion. Aortic Atherosclerosis (ICD10-I70.0). Electronically Signed   By: Constance Holster M.D.   On: 09/02/2019 16:07   DG Chest Port 1 View  Result Date:  09/03/2019 CLINICAL DATA:  Cough and fevers, EXAM: PORTABLE CHEST 1 VIEW COMPARISON:  CT from the previous day. FINDINGS: Cardiac shadow is within normal limits. Aortic calcifications are noted. The left lung is clear. Right hilar and infrahilar mass lesion is noted similar to that noted on prior CT examination. No other focal abnormality is seen. IMPRESSION: Stable right hilar and infrahilar mass lesion similar to that seen on prior CT from 1 day previous. Electronically Signed   By: Inez Catalina M.D.   On: 09/03/2019 02:26    Procedures Procedures (including critical care time)  Medications Ordered in ED Medications  lactated ringers bolus 1,000 mL (has no administration in time range)    And  lactated ringers bolus 1,000 mL (has no administration in time range)    And  lactated ringers bolus 1,000 mL (has no administration in time range)  ceFEPIme (MAXIPIME) 2 g in sodium chloride 0.9 % 100 mL IVPB (has no administration in time range)  metroNIDAZOLE (FLAGYL) IVPB 500 mg (has no administration in time range)  vancomycin (VANCOCIN) IVPB 1000 mg/200 mL premix (has no administration in time range)    ED Course  I have reviewed the triage vital signs and the nursing notes.  Pertinent labs & imaging results that were available during my care of the patient were reviewed by me and considered in my medical decision making (see chart for details).    MDM Rules/Calculators/A&P                          Patient reports a 2-week history of intermittent fever.  He has been on antibiotics as an outpatient without improvement.  He was called today after he had blood drawn by his primary doctor and told to come to the ER.  This was presumably secondary to his leukocytosis.  He was febrile this morning at his visit with a temperature of 102.  He is not febrile at arrival to the ER tonight.  He has been exhibiting tachycardia as well as decreased oxygen saturations.  He did have multiple SIRS criteria  earlier including his tachycardia and fever which have now resolved.  Patient has had a prolonged stay in the waiting room prior to my evaluation.  Will initiate treatment for presumed sepsis.  Addendum: Discussed with Dr. Tonie Griffith - will have daytime team come to evaluate the patient for admission.  Final Clinical Impression(s) / ED Diagnoses Final diagnoses:  Sepsis without acute organ dysfunction, due to unspecified organism [facility])    Rx / DC Orders ED Discharge Orders    None       Domenik Trice, Gwenyth Allegra, MD 09/03/19 [id number]    Orpah Greek, MD 09/03/19 [id number]    Orpah Greek, MD 09/03/19 [phone]

## 2019-09-03 NOTE — Progress Notes (Signed)
Pt admitted to 5W 35. Skin assessed. VS checked and documented. Pt oriented to the floor. Call bell within reach.

## 2019-09-03 NOTE — H&P (View-Only) (Signed)
NAME:  Jerry Wong, MRN:  [id number], DOB:  08/14/1945, LOS: 0 ADMISSION DATE:  09/02/2019, CONSULTATION DATE:  09/03/2019 REFERRING MD:  Tamala Julian, CHIEF COMPLAINT:  Necrotic Lung Mass   Brief History   Jerry Wong is a 74 y.o. male former smoker with medical history significant of hypertension, hyperlipidemia, nephrolithiasis, neuropathy, prostate cancer s/p prostatectomy/salvage radiation/Eligard, and remote history of tobacco abuse. He was admitted on 09/02/2019 with sepsis ,  most likely source being  post obstructive pneumonia  / necrotic lymph nodes.  He has had  complaints of dyspnea on exertion, wheezing and intermittent fever and cough of the last 2-3 weeks, and had failed outpatient therapy. He was diagnosed with a pulmonary mass 08/25/2019 after a CXR at an urgent care. He was directed to the ED by his PCP after a CT chest was done revealing a right hilar lung mass, and several necrotic appearing mediastinal and right hilar  lymph nodes.  PCCM have been  consulted for work up of pulmonary mass.   History of present illness   Jerry Wong is a 74 y.o. male former smoker with medical history significant of hypertension, hyperlipidemia, nephrolithiasis, neuropathy, prostate cancer s/p prostatectomy/salvage radiation/Eligard, and remote history of tobacco abuse. He was admitted on 09/02/2019 with sepsis ,  most likely source being  post obstructive pneumonia  / necrotic lymph nodes.  He has had  complaints of dyspnea on exertion, wheezing and intermittent fever and cough of the last 2-3 weeks, and had failed outpatient therapy. He states he had no issues prior to 2 weeks ago. He was diagnosed with a pulmonary mass 08/25/2019 after a CXR at an urgent care. He was directed to the ED by his PCP after a CT chest was done revealing a right hilar lung mass, and several necrotic appearing mediastinal and right hilar  lymph nodes.He denies any weight loss or hemoptysis.   In the ED he was found to have fever up to 102.5,  HR 114, SaO2 of 78% on RA. This improved with oxygen at 4 L [place]. CT scan of the chest with contrast revealed 5.1 x 3.9 cm right hilar lung mass with necrotic appearing surrounding lymph nodes obtained from yesterday. Labs significant for WBC 16.4, hemoglobin 11.5 sodium 133, BUN 24, creatinine 1.52, and lactic acid 2.  Urinalysis was significant for hemoglobin and elevated specific gravity.   Patient met sepsis criteria and blood cultures were obtained.  Patient was given a full fluid bolus with empiric antibiotics of vancomycin, metronidazole, and cefepime.  [facility] admitted the patient, and consulted PCCM on 7/9 to evaluate Lung mass for work up for suspected bronchogenic carcinoma vs prostate mets.   Past Medical History   Past Medical History:  Diagnosis Date  . Arthritis   . Cataract   . Chickenpox   . Chronic headaches   . Chronic kidney disease   . Chronic neck and back pain   . History of kidney stones   . Hyperlipidemia   . Hypertension   . Insomnia   . Neuromuscular disorder ([place])   . Prostate cancer [facility])     Combine Hospital Events   09/02/2019 Admission with Sepsis  Consults:  09/03/2019 PCCM  Procedures:    Significant Diagnostic Tests:  09/02/2019 CT Chest with Contrast 1. Ill-defined right hilar lung mass measuring approximately 5.1 x 3.9 cm with multiple pathologically enlarged, necrotic appearing mediastinal and right hilar lymph nodes. 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 2. Indeterminate 2.3 cm left adrenal mass. While this  may represent a benign adenoma, a metastatic lesion is not excluded. 3. Trace pericardial effusion.  Micro Data:  7/9 Blood>> 7/9 Coronavirus Negative>>  Antimicrobials:  6/17 Doxycycline after tele visit 6/30 Levaquin after Urgent Care Visit  Vanc 7/8>> Cefepime 7/9>> Metronidazole 7/9 Interim history/subjective:  Stable on 4L [place] States he has cough and fever.  Objective   Blood pressure 112/61, pulse 97,  temperature 98.4 F (36.9 C), temperature source Oral, resp. rate 16, SpO2 94 %.        Intake/Output Summary (Last 24 hours) at 09/03/2019 1130 Last data filed at 09/03/2019 0605 Gross per 24 hour  Intake 1199.47 ml  Output --  Net 1199.47 ml   There were no vitals filed for this visit.  Examination: General: Elderly male, supine on stretcher, in NAD HENT: NCAT, No LAD, PERRLA Lungs: Bilateral chest excursion, slightly diminished R base, few rhonchi, no wheeze. Cardiovascular: S1, S2, RRR, No RMG Abdomen: Soft, NT, ND, BS +, There is no height or weight on file to calculate BMI. Extremities: No obvious deformities, warm and dry to touch, brisk capillary refill Neuro: A&O x 3, MAE x 4, appropriate GU: NA  Resolved Hospital Problem list     Assessment & Plan:  Sepsis in setting of 5.1 x 3.9 cm right hilar mass with multiple pathologic and necrotic appearing lymph nodes. Concern for post obstructive pneumonia/ bronchogenic carcinoma Currently hemodynamically stable Plan - Per Primary Team - Follow Micro( Blood Urine Sputum) - continue Empiric antibiotics vancomycin, metronidazole, cefepime - MAP Goal of > 65  Acute Respiratory Failure  With Hypoxia  Plan - Titrate oxygen to maintain oxygen sats of > 92% - Trend CXR - Aggressive Pulmonary Toilet ( IS, OOB to chair) - Mucinex as mucolytic - Will need ambulatory saturation prior to discharge , may need home O2 - Will need close follow up with Pulmonary as OP  5.1 x 3.9 cm right hilar mass with multiple pathologic and necrotic appearing lymph nodes Remote history of tobacco abuse ( Quit 40 years ago with a 3 pack year smoking history) 2.3 cm  Left Adrenal Mass>> indeterminate History of Prostate cancer ( radical retropubic prostatectomy in May 1999, subsequently salvage radiation therapy after PSAs noted to increase in 2006, and most recently administered Eligard in 2021.) History of chronic back pain ? Concern for possible  prostate mets Plan Will need tissue sampling once he has recovered from his sepsis  either by  bronch or adrenal gland biopsy  to determine if primary bronchogenic vs prostate mets. Will need PET scan as OP  Acute on Chronic Renal Failure ( CKD III) Plan Trend BMET Replete electrolytes and replete as needed IVF per primary team Hold hydrochlorothiazide   Best practice:  Diet: Per Primary Team Pain/Anxiety/Delirium protocol (if indicated):  VAP protocol (if indicated): NA DVT prophylaxis: Lovenox GI prophylaxis: Per Primary Glucose control: Per Primary Mobility: OOB to chair Code Status:Full Family Communication: Pt. Updated at bedside Disposition: Inpatient. Level of care per Primary team  Labs   CBC: Recent Labs  Lab 09/02/19 1002 09/02/19 1711  WBC 16.9* 16.4*  NEUTROABS 13.3* 11.9*  HGB 11.3* 11.5*  HCT 34.4* 35.5*  MCV 80.6 81.4  PLT 358.0 409    Basic Metabolic Panel: Recent Labs  Lab 09/02/19 1002 09/02/19 1711  NA 132* 133*  K 3.8 3.7  CL 95* 97*  CO2 28 21*  GLUCOSE 130* 131*  BUN 29* 24*  CREATININE 1.52* 1.51*  CALCIUM 9.1 8.6*  GFR: Estimated Creatinine Clearance: 51 mL/min (A) (by C-G formula based on SCr of 1.51 mg/dL (H)). Recent Labs  Lab 09/02/19 1002 09/02/19 1711 09/03/19 0236 09/03/19 0624 09/03/19 0906  WBC 16.9* 16.4*  --   --   --   LATICACIDVEN  --   --  2.0* 1.6 1.2    Liver Function Tests: Recent Labs  Lab 09/02/19 1711  AST 21  ALT 19  ALKPHOS 66  BILITOT 1.0  PROT 6.8  ALBUMIN 3.1*   No results for input(s): LIPASE, AMYLASE in the last 168 hours. No results for input(s): AMMONIA in the last 168 hours.  ABG No results found for: PHART, PCO2ART, PO2ART, HCO3, TCO2, ACIDBASEDEF, O2SAT   Coagulation Profile: No results for input(s): INR, PROTIME in the last 168 hours.  Cardiac Enzymes: No results for input(s): CKTOTAL, CKMB, CKMBINDEX, TROPONINI in the last 168 hours.  HbA1C: Hgb A1c MFr Bld  Date/Time  Value Ref Range Status  12/25/2018 12:20 PM 6.2 4.6 - 6.5 % Final    Comment:    Glycemic Control Guidelines for People with Diabetes:Non Diabetic:  <6%Goal of Therapy: <7%Additional Action Suggested:  >8%   12/22/2017 01:03 PM 6.1 4.6 - 6.5 % Final    Comment:    Glycemic Control Guidelines for People with Diabetes:Non Diabetic:  <6%Goal of Therapy: <7%Additional Action Suggested:  >8%     CBG: No results for input(s): GLUCAP in the last 168 hours.  Review of Systems:    Gen: Denies + fever, + chills, No weight change, + fatigue, night sweats HEENT: Denies blurred vision, double vision, hearing loss, tinnitus, sinus congestion, rhinorrhea, sore throat, neck stiffness, dysphagia PULM: +  shortness of breath, + cough, + sputum production, No hemoptysis, + wheezing CV: Denies chest pain, edema, orthopnea, paroxysmal nocturnal dyspnea, palpitations GI: Denies abdominal pain, nausea, vomiting, diarrhea, hematochezia, melena, constipation, change in bowel habits GU: Denies dysuria, hematuria, polyuria, oliguria, urethral discharge Endocrine: Denies hot or cold intolerance, polyuria, polyphagia or appetite change Derm: Denies rash, dry skin, scaling or peeling skin change Heme: Denies easy bruising, bleeding, bleeding gums Neuro: Denies headache, numbness, weakness, slurred speech, loss of memory or consciousness  Past Medical History  He,  has a past medical history of Arthritis, Cataract, Chickenpox, Chronic headaches, Chronic kidney disease, Chronic neck and back pain, History of kidney stones, Hyperlipidemia, Hypertension, Insomnia, Neuromuscular disorder ([place]), and Prostate cancer ([place]).   Surgical History    Past Surgical History:  Procedure Laterality Date  . BACK SURGERY  1974  . NECK SURGERY  2002  . PROSTATECTOMY    . TONSILLECTOMY  1958  . TRIGGER FINGER RELEASE Left 11/20/2017   left thumb/Dr. Apolonio Schneiders     Social History   reports that he has quit smoking. He has a 3.30  pack-year smoking history. He has never used smokeless tobacco. He reports previous alcohol use. He reports that he does not use drugs.   Family History   His family history includes Arthritis in his father and mother; Hyperlipidemia in his father; Hypertension in his father; Lung cancer in his father and mother; Prostate cancer in his brother; Stomach cancer in his maternal grandmother; Stroke in his father. There is no history of Colon cancer, Esophageal cancer, Pancreatic cancer, or Rectal cancer.   Allergies No Known Allergies   Home Medications  Prior to Admission medications   Medication Sig Start Date End Date Taking? Authorizing Provider  albuterol (VENTOLIN HFA) 108 (90 Base) MCG/ACT inhaler Inhale 2 puffs  into the lungs every 4 (four) hours as needed for wheezing or shortness of breath. 08/25/19  Yes Rodriguez-Southworth, Sunday Spillers, PA-C  amitriptyline (ELAVIL) 75 MG tablet Take 1 tablet (75 mg total) by mouth at bedtime. 10/26/18  Yes Dohmeier, Asencion Partridge, MD  amLODipine (NORVASC) 10 MG tablet TAKE 1 TABLET BY MOUTH EVERY DAY Patient taking differently: Take 10 mg by mouth daily.  04/13/19  Yes Pleas Koch, NP  aspirin EC 81 MG tablet Take 81 mg by mouth daily.    Yes [provider]  atorvastatin (LIPITOR) 20 MG tablet TAKE 1 TABLET BY MOUTH EVERY DAY Patient taking differently: Take 20 mg by mouth daily.  05/26/19  Yes Pleas Koch, NP  hydrochlorothiazide (HYDRODIURIL) 25 MG tablet TAKE 1 TABLET BY MOUTH EVERY DAY FOR BLOOD PRESSURE Patient taking differently: Take 25 mg by mouth daily.  08/20/19  Yes Pleas Koch, NP  LYRICA 100 MG capsule Take 100 mg by mouth 2 (two) times daily.  10/10/16  Yes [provider]  Omega-3 Fatty Acids ([employer] FISH OIL) 1000 MG CAPS TAKE 1 CAPSULE BY MOUTH EVERY DAY WITH A MEAL Patient taking differently: Take 1,000 mg by mouth daily. TAKE 1 CAPSULE BY MOUTH EVERY DAY WITH A MEAL 03/06/18  Yes Pleas Koch, NP    oxyCODONE-acetaminophen (PERCOCET) 10-325 MG per tablet Take 1 tablet by mouth as needed for pain (up to five times daily).  07/29/12  Yes [provider]  tiZANidine (ZANAFLEX) 4 MG tablet TAKE 1 TABLET BY MOUTH TWICE A DAY Patient taking differently: Take 4 mg by mouth 2 (two) times daily. TAKE 1 TABLET BY MOUTH TWICE A DAY 10/26/18  Yes Dohmeier, Asencion Partridge, MD  zolpidem (AMBIEN) 10 MG tablet Take 1 tablet (10 mg total) by mouth at bedtime as needed. 10/26/18  Yes Dohmeier, Asencion Partridge, MD  levofloxacin (LEVAQUIN) 500 MG tablet Take 1 tablet (500 mg total) by mouth daily. 08/25/19   Rodriguez-Southworth, Sunday Spillers, PA-C  predniSONE (DELTASONE) 20 MG tablet One bid x 3 days, then one qd x 3 Patient not taking: Reported on 09/03/2019 08/25/19   Rodriguez-Southworth, Sunday Spillers, PA-C     Critical care time: 45 minutes    Magdalen Spatz, MSN, AGACNP-BC [place] for personal pager PCCM on call pager [phone] 09/03/2019 11:30 AM

## 2019-09-03 NOTE — ED Notes (Signed)
Pt SPO2 87-89% on RA without exertion, pt denies feeling SOB. Pt placed on 4L O2 via therapy SPO2 increased to 93%.

## 2019-09-04 LAB — PROCALCITONIN: Procalcitonin: 0.24 ng/mL

## 2019-09-04 LAB — COMPREHENSIVE METABOLIC PANEL
ALT: 18 U/L (ref 0–44)
AST: 19 U/L (ref 15–41)
Albumin: 2.4 g/dL — ABNORMAL LOW (ref 3.5–5.0)
Alkaline Phosphatase: 57 U/L (ref 38–126)
Anion gap: 12 (ref 5–15)
BUN: 15 mg/dL (ref 8–23)
CO2: 25 mmol/L (ref 22–32)
Calcium: 8.4 mg/dL — ABNORMAL LOW (ref 8.9–10.3)
Chloride: 102 mmol/L (ref 98–111)
Creatinine, Ser: 1.03 mg/dL (ref 0.61–1.24)
GFR calc Af Amer: >60 mL/min (ref >60)
GFR calc non Af Amer: >60 mL/min (ref >60)
Glucose, Bld: 107 mg/dL — ABNORMAL HIGH (ref 70–99)
Potassium: 2.9 mmol/L — ABNORMAL LOW (ref 3.5–5.1)
Sodium: 139 mmol/L (ref 135–145)
Total Bilirubin: 0.6 mg/dL (ref 0.3–1.2)
Total Protein: 6.1 g/dL — ABNORMAL LOW (ref 6.5–8.1)

## 2019-09-04 LAB — CBC
HCT: 32.8 % — ABNORMAL LOW (ref 39.0–52.0)
Hemoglobin: 10.3 g/dL — ABNORMAL LOW (ref 13.0–17.0)
MCH: 26 pg (ref 26.0–34.0)
MCHC: 31.4 g/dL (ref 30.0–36.0)
MCV: 82.8 fL (ref 80.0–100.0)
Platelets: 286 10*3/uL (ref 150–400)
RBC: 3.96 MIL/uL — ABNORMAL LOW (ref 4.22–5.81)
RDW: 14.4 % (ref 11.5–15.5)
WBC: 11.6 10*3/uL — ABNORMAL HIGH (ref 4.0–10.5)
nRBC: 0 % (ref 0.0–0.2)

## 2019-09-04 MED ORDER — SODIUM CHLORIDE 0.9 % IV SOLN
INTRAVENOUS | Status: DC | PRN
  Administered 2019-09-04: 5 mL via INTRAVENOUS

## 2019-09-04 MED ORDER — POTASSIUM CHLORIDE CRYS ER 20 MEQ PO TBCR
40.0000 meq | EXTENDED_RELEASE_TABLET | ORAL | Status: AC
  Administered 2019-09-04 (×2): 40 meq via ORAL
  Filled 2019-09-04 (×2): qty 2

## 2019-09-04 MED ORDER — SODIUM CHLORIDE 0.9 % IV SOLN
2.0000 g | Freq: Three times a day (TID) | INTRAVENOUS | Status: DC
  Administered 2019-09-04 – 2019-09-05 (×3): 2 g via INTRAVENOUS
  Filled 2019-09-04 (×3): qty 2

## 2019-09-04 MED ORDER — ORAL CARE MOUTH RINSE
15.0000 mL | Freq: Two times a day (BID) | OROMUCOSAL | Status: DC
  Administered 2019-09-04 – 2019-09-08 (×8): 15 mL via OROMUCOSAL

## 2019-09-04 MED ORDER — VANCOMYCIN HCL 1250 MG/250ML IV SOLN
1250.0000 mg | Freq: Two times a day (BID) | INTRAVENOUS | Status: DC
  Filled 2019-09-04: qty 250

## 2019-09-04 NOTE — Progress Notes (Signed)
   09/04/19 2011  Assess: MEWS Score  Temp 98.5 F (36.9 C)  BP (!) 93/55  Pulse Rate 74  ECG Heart Rate 74  Resp 16  Level of Consciousness Alert  SpO2 92 %  O2 Device Nasal Cannula  Patient Activity (if Appropriate) In bed  O2 Flow Rate (L/min) 2 L/min  Assess: MEWS Score  MEWS Temp 0  MEWS Systolic 1  MEWS Pulse 0  MEWS RR 0  MEWS LOC 0  MEWS Score 1  MEWS Score Color Green  Assess: if the MEWS score is Yellow or Red  Were vital signs taken at a resting state? Yes  Focused Assessment Documented focused assessment  Early Detection of Sepsis Score *See Row Information* Low  MEWS guidelines implemented *See Row Information* No, other (Comment) (no acute changes)  Document  Progress note created (see row info) Yes

## 2019-09-04 NOTE — Plan of Care (Signed)

## 2019-09-04 NOTE — Progress Notes (Signed)
Pharmacy Antibiotic Note  Jerry Wong is a 74 y.o. male admitted on 09/02/2019 with sepsis.  Pharmacy has been consulted for cefepime and vancomycin dosing.Pt is afebrile, WBC still elevated but down trending. Renal function has improved to Scr 1.01, CrCl 74.73ml/min.   Plan: Vancomycin 1250mg  IV every 12 hours.  Goal trough 15-20 mcg/mL. Cefepime 2g IV every 8 hours Follow renal function, clinical status, and trough at steady state    Temp (24hrs), Avg:98.7 F (37.1 C), Min:97.9 F (36.6 C), Max:99.4 F (37.4 C)  Recent Labs  Lab 09/02/19 1002 09/02/19 1711 09/03/19 0236 09/03/19 0624 09/03/19 0906 09/04/19 0741  WBC 16.9* 16.4*  --   --   --  11.6*  CREATININE 1.52* 1.51*  --   --   --  1.03  LATICACIDVEN  --   --  2.0* 1.6 1.2  --     Estimated Creatinine Clearance: 74.7 mL/min (by C-G formula based on SCr of 1.03 mg/dL).    No Known Allergies  Antimicrobials this admission: Vancomycin 7/9>> Cefepime 7/9>> Flagyl 7/9>>  Dose adjustments this admission: Vancomycin 750mg  q12h adjusted for renal function to 1250mg  q12h Cefepime 2g q12h adjusted for renal function to 2g q8h   Microbiology results: COVID neg Bcx: ng <12h  Thank you for allowing pharmacy to be a part of this patient's care.   Wilson Singer, PharmD PGY1 Pharmacy Resident 09/04/2019 2:26 PM

## 2019-09-04 NOTE — Progress Notes (Addendum)
PROGRESS NOTE        PATIENT DETAILS Name: Jerry Wong Age: 74 y.o. Sex: male Date of Birth: 06/01/1945 Admit Date: 09/02/2019 Admitting Physician Norval Morton, MD [id number], Leticia Penna, NP  Brief Narrative: Patient is a 74 y.o. male HTN, HLD, prostate cancer s/p prostatectomy/radiation/Eligard, remote history of tobacco use, recently diagnosed with right lower lobe lung mass on 6/30-who presented with fever, cough in spite of being on oral antimicrobial therapy prior to this hospital stay-found to have sepsis with acute hypoxic respiratory failure secondary to postobstructive pneumonia -and admitted to the hospitalist service.  See below for further details.  Significant events: 7/8>> admit to [facility] for sepsis/hypoxia from postobstructive pneumonia.  Recently diagnosed with lung mass.  Significant studies: 7/8>> CT chest: 5.1 x 3.9 cm right lung hilar mass, necrotic appearing mediastinal/hilar lymph nodes.  Indeterminate 2.3 cm left adrenal mass. 7/9>> chest x-ray: Stable right hilar and infrahilar mass lesion similar to that seen on prior CT.  Antimicrobial therapy: Vancomycin: 7/8>> Flagyl: 7/8>> Cefepime: 7/8>>  Microbiology data: 7/9: Blood culture>> no growth.  Procedures : None  Consults: PCCM  DVT Prophylaxis : enoxaparin (LOVENOX) injection 40 mg Start: 09/03/19 1400  Subjective: Lying comfortably in bed-feels better-breathing is improved-continues to cough intermittently.  Assessment/Plan: Sepsis with acute hypoxic respiratory failure secondary to postobstructive pneumonia: Sepsis physiology has improved-hypoxia improving as well-cultures negative so far.  WBC downtrending.  Stop vancomycin-for now continue with Flagyl and cefepime-if clinical improvement continues-suspect we can titrate down to Unasyn tomorrow.  AKI: Improved-likely hemodynamically mediated.  Supportive care.  Right lung mass-suspicion for bronchogenic carcinoma: PCCM  consulted-for bronchoscopy tentatively on 7/12.  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  Insomnia: Continue Ambien   Diet: Diet Order            Diet NPO time specified  Diet effective now           Diet Heart Room service appropriate? Yes; Fluid consistency: Thin  Diet effective now                  Code Status: Full code   Family Communication: Offered to call family-patient prefers to update himself.  Disposition Plan: Status is: Inpatient  Remains inpatient appropriate because:Inpatient level of care appropriate due to severity of illness  Dispo: The patient is from: Home              Anticipated d/c is to: Home              Anticipated d/c date is: 3 days              Patient currently is not medically stable to d/c.  Barriers to Discharge: Sepsis/hypoxia-secondary to postobstructive pneumonia-failed outpatient oral antibiotics-on IV antibiotics-bronchoscopy scheduled for 7/12.  Antimicrobial agents: Anti-infectives (From admission, onward)   Start     Dose/Rate Route Frequency Ordered Stop   09/03/19 1800  vancomycin (VANCOREADY) IVPB 750 mg/150 mL     Discontinue     750 mg 150 mL/hr over 60 Minutes Intravenous Every 12 hours 09/03/19 0752     09/03/19 1600  ceFEPIme (MAXIPIME) 2 g in sodium  chloride 0.9 % 100 mL IVPB     Discontinue     2 g 200 mL/hr over 30 Minutes Intravenous Every 12 hours 09/03/19 0752     09/03/19 1400  metroNIDAZOLE (FLAGYL) IVPB 500 mg     Discontinue     500 mg 100 mL/hr over 60 Minutes Intravenous Every 8 hours 09/03/19 0728     09/03/19 0245  ceFEPIme (MAXIPIME) 2 g in sodium chloride 0.9 % 100 mL IVPB        2 g 200 mL/hr over 30 Minutes Intravenous   Once 09/03/19 0233 09/03/19 0419   09/03/19 0245  metroNIDAZOLE (FLAGYL) IVPB 500 mg        500 mg 100 mL/hr over 60 Minutes Intravenous  Once 09/03/19 0233 09/03/19 0524   09/03/19 0245  vancomycin (VANCOCIN) IVPB 1000 mg/200 mL premix  Status:  Discontinued        1,000 mg 200 mL/hr over 60 Minutes Intravenous  Once 09/03/19 0233 09/03/19 0233   09/03/19 0245  vancomycin (VANCOREADY) IVPB 2000 mg/400 mL        2,000 mg 200 mL/hr over 120 Minutes Intravenous  Once 09/03/19 0234 09/03/19 0947       Time spent: 35- minutes-Greater than 50% of this time was spent in counseling, explanation of diagnosis, planning of further management, and coordination of care.  MEDICATIONS: Scheduled Meds: . amitriptyline  75 mg Oral QHS  . amLODipine  10 mg Oral Daily  . aspirin EC  81 mg Oral Daily  . atorvastatin  20 mg Oral QHS  . enoxaparin (LOVENOX) injection  40 mg Subcutaneous Q24H  . guaiFENesin  600 mg Oral BID  . potassium chloride  40 mEq Oral Q4H  . pregabalin  100 mg Oral BID   Continuous Infusions: . ceFEPime (MAXIPIME) IV 2 g (09/04/19 0318)  . metronidazole 500 mg (09/04/19 0505)  . vancomycin 750 mg (09/04/19 0606)   PRN Meds:.acetaminophen, oxyCODONE-acetaminophen **AND** oxyCODONE, tiZANidine, zolpidem   PHYSICAL EXAM: Vital signs: Vitals:   09/03/19 1300 09/03/19 1328 09/03/19 2113 09/04/19 0558  BP: (!) 117/50 (!) 125/50 129/64 (!) 103/52  Pulse: 89 87    Resp: 19 18 18    Temp:  98 F (36.7 C) 99.4 F (37.4 C) 98.8 F (37.1 C)  TempSrc:  Oral Oral Oral  SpO2: 95% 94%     There were no vitals filed for this visit. There is no height or weight on file to calculate BMI.   Gen Exam:Alert awake-not in any distress HEENT:atraumatic, normocephalic Chest: B/L clear to auscultation anteriorly CVS:S1S2 regular Abdomen:soft non tender, non distended Extremities:no edema Neurology: Non focal Skin: no rash  I have personally reviewed following labs and imaging  studies  LABORATORY DATA: CBC: Recent Labs  Lab 09/02/19 1002 09/02/19 1711 09/04/19 0741  WBC 16.9* 16.4* 11.6*  NEUTROABS 13.3* 11.9*  --   HGB 11.3* 11.5* 10.3*  HCT 34.4* 35.5* 32.8*  MCV 80.6 81.4 82.8  PLT 358.0 367 814    Basic Metabolic Panel: Recent Labs  Lab 09/02/19 1002 09/02/19 1711 09/04/19 0741  NA 132* 133* 139  K 3.8 3.7 2.9*  CL 95* 97* 102  CO2 28 21* 25  GLUCOSE 130* 131* 107*  BUN 29* 24* 15  CREATININE 1.52* 1.51* 1.03  CALCIUM 9.1 8.6* 8.4*    GFR: Estimated Creatinine Clearance: 74.7 mL/min (by C-G formula based on SCr of 1.03 mg/dL).  Liver Function Tests: Recent Labs  Lab 09/02/19 1711 09/04/19 0741  AST 21 19  ALT 19 18  ALKPHOS 66 57  BILITOT 1.0 0.6  PROT 6.8 6.1*  ALBUMIN 3.1* 2.4*   No results for input(s): LIPASE, AMYLASE in the last 168 hours. No results for input(s): AMMONIA in the last 168 hours.  Coagulation Profile: No results for input(s): INR, PROTIME in the last 168 hours.  Cardiac Enzymes: No results for input(s): CKTOTAL, CKMB, CKMBINDEX, TROPONINI in the last 168 hours.  BNP (last 3 results) No results for input(s): PROBNP in the last 8760 hours.  Lipid Profile: No results for input(s): CHOL, HDL, LDLCALC, TRIG, CHOLHDL, LDLDIRECT in the last 72 hours.  Thyroid Function Tests: No results for input(s): TSH, T4TOTAL, FREET4, T3FREE, THYROIDAB in the last 72 hours.  Anemia Panel: No results for input(s): VITAMINB12, FOLATE, FERRITIN, TIBC, IRON, RETICCTPCT in the last 72 hours.  Urine analysis:    Component Value Date/Time   COLORURINE YELLOW 09/03/2019 0426   APPEARANCEUR CLEAR 09/03/2019 0426   LABSPEC 1.035 (H) 09/03/2019 0426   PHURINE 5.0 09/03/2019 0426   GLUCOSEU NEGATIVE 09/03/2019 0426   HGBUR MODERATE (A) 09/03/2019 0426   BILIRUBINUR NEGATIVE 09/03/2019 0426   KETONESUR NEGATIVE 09/03/2019 0426   PROTEINUR NEGATIVE 09/03/2019 0426   NITRITE NEGATIVE 09/03/2019 0426   LEUKOCYTESUR  NEGATIVE 09/03/2019 0426    Sepsis Labs: Lactic Acid, Venous    Component Value Date/Time   LATICACIDVEN 1.2 09/03/2019 0906    MICROBIOLOGY: Recent Results (from the past 240 hour(s))  Culture, blood (Routine X 2) w Reflex to ID Panel     Status: None (Preliminary result)   Collection Time: 09/03/19  1:51 AM   Specimen: BLOOD RIGHT HAND  Result Value Ref Range Status   Specimen Description BLOOD RIGHT HAND  Final   Special Requests   Final    BOTTLES DRAWN AEROBIC AND ANAEROBIC Blood Culture adequate volume   Culture   Final    NO GROWTH < 12 HOURS Performed at [facility], [place] [address] [place], [place] [address]    Report Status PENDING  Incomplete  Culture, blood (Routine X 2) w Reflex to ID Panel     Status: None (Preliminary result)   Collection Time: 09/03/19  2:37 AM   Specimen: BLOOD  Result Value Ref Range Status   Specimen Description BLOOD LEFT ANTECUBITAL  Final   Special Requests   Final    BOTTLES DRAWN AEROBIC AND ANAEROBIC Blood Culture results may not be optimal due to an inadequate volume of blood received in culture bottles   Culture   Final    NO GROWTH < 12 HOURS Performed at [facility], [place] [address] [place], [place] [address]    Report Status PENDING  Incomplete  SARS Coronavirus 2 by RT PCR (hospital order, performed in [facility] hospital lab) Nasopharyngeal Nasopharyngeal Swab     Status: None   Collection Time: 09/03/19  4:13 AM   Specimen: Nasopharyngeal Swab  Result Value Ref Range Status   SARS Coronavirus 2 NEGATIVE NEGATIVE Final    Comment: (NOTE) SARS-CoV-2 target nucleic acids are NOT DETECTED.  The SARS-CoV-2 RNA is generally detectable in upper and lower respiratory specimens during the acute phase of infection. The lowest concentration of SARS-CoV-2 viral copies this assay can detect is 250 copies / mL. A negative result does not preclude SARS-CoV-2 infection and should not be used as the sole basis for  treatment or other patient management decisions.  A negative result may occur with  improper specimen collection / handling, submission of specimen other than nasopharyngeal swab, presence of viral mutation(s) within the areas targeted by this assay, and inadequate number of viral copies (<250 copies / mL). A negative result must be combined with clinical observations, patient history, and epidemiological information.  Fact Sheet for Patients:   StrictlyIdeas.no  Fact Sheet for Healthcare Providers: BankingDealers.co.za  This test is not yet approved or  cleared by the [place] FDA and has been authorized for detection and/or diagnosis of SARS-CoV-2 by FDA under an Emergency Use Authorization (EUA).  This EUA will remain in effect (meaning this test can be used) for the duration of the COVID-19 declaration under Section 564(b)(1) of the Act, 21 U.S.C. section 360bbb-3(b)(1), unless the authorization is terminated or revoked sooner.  Performed at [facility], [place] [address], [place], [place] [address]     RADIOLOGY STUDIES/RESULTS: CT Chest W Contrast  Result Date: 09/02/2019 CLINICAL DATA:  Daily fevers. Shortness of breath x3 weeks. Abnormal chest x-ray. EXAM: CT CHEST WITH CONTRAST TECHNIQUE: Multidetector CT imaging of the chest was performed during intravenous contrast administration. CONTRAST:  52mL OMNIPAQUE IOHEXOL 300 MG/ML  SOLN COMPARISON:  Chest x-ray dated August 25, 2019 FINDINGS: Cardiovascular: There are atherosclerotic changes of the thoracic aorta without evidence for dissection or aneurysm. The main pulmonary artery is not significantly dilated. There is no large centrally located pulmonary embolism. Detection of smaller pulmonary emboli is severely limited by contrast bolus timing. There are coronary artery calcifications. There is a trace pericardial effusion. The heart size is normal. Mediastinum/Nodes: --there  are enlarged mediastinal lymph nodes. For example there is a necrotic appearing subcarinal lymph node measuring 2.3 cm (axial series 2, image 83). Additional enlarged mediastinal lymph nodes are noted. --multiple pathologically enlarged, necrotic right hilar lymph nodes are noted. -- No axillary lymphadenopathy. -- No supraclavicular lymphadenopathy. -- Normal thyroid gland where visualized. -  Unremarkable esophagus. Lungs/Pleura: There is an ill-defined right hilar lung mass measuring approximately 5.1 x 3.9 cm (axial series 2, image 96). There may be postobstructive pneumonia in the right lower lobe. There is interlobular septal thickening involving the right lower lobe. There is atelectasis at the left lung base. Upper Abdomen: There is a left adrenal mass measuring approximately 2.3 cm (axial series 2, image 160). Multiple renal cysts are noted bilaterally. Musculoskeletal: No chest wall abnormality. No bony spinal canal stenosis. IMPRESSION: 1. Ill-defined right hilar lung mass measuring approximately 5.1 x 3.9 cm with multiple pathologically enlarged, necrotic appearing mediastinal and right hilar lymph nodes. 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 2. Indeterminate 2.3 cm left adrenal mass. While this may represent a benign adenoma, a metastatic lesion is not excluded. 3. Trace pericardial effusion. Aortic Atherosclerosis (ICD10-I70.0). Electronically Signed   By: Constance Holster M.D.   On: 09/02/2019 16:07   DG Chest Port 1 View  Result Date: 09/03/2019 CLINICAL DATA:  Cough and fevers, EXAM: PORTABLE CHEST 1 VIEW COMPARISON:  CT from the previous day. FINDINGS: Cardiac shadow is within normal limits. Aortic calcifications are noted. The left lung is clear. Right hilar and infrahilar mass lesion is noted similar to that noted on prior CT examination. No other focal abnormality is seen. IMPRESSION: Stable right hilar and infrahilar mass lesion similar to that seen on prior CT from 1 day  previous. Electronically Signed   By: Inez Catalina M.D.   On: 09/03/2019 02:26     LOS: 1 day   Oren Binet, MD  [facility]  To contact the attending provider between 7A-7P or the covering provider during after hours 7P-7A, please log into the web site [email or web address] and access using universal [place] password for that web site. If you do not have the password, please call the hospital operator.  09/04/2019, 11:06 AM

## 2019-09-05 LAB — CBC
HCT: 33.4 % — ABNORMAL LOW (ref 39.0–52.0)
Hemoglobin: 10.7 g/dL — ABNORMAL LOW (ref 13.0–17.0)
MCH: 26.7 pg (ref 26.0–34.0)
MCHC: 32 g/dL (ref 30.0–36.0)
MCV: 83.3 fL (ref 80.0–100.0)
Platelets: 316 10*3/uL (ref 150–400)
RBC: 4.01 MIL/uL — ABNORMAL LOW (ref 4.22–5.81)
RDW: 14.7 % (ref 11.5–15.5)
WBC: 12.4 10*3/uL — ABNORMAL HIGH (ref 4.0–10.5)
nRBC: 0 % (ref 0.0–0.2)

## 2019-09-05 LAB — BASIC METABOLIC PANEL
Anion gap: 9 (ref 5–15)
BUN: 14 mg/dL (ref 8–23)
CO2: 24 mmol/L (ref 22–32)
Calcium: 8.5 mg/dL — ABNORMAL LOW (ref 8.9–10.3)
Chloride: 105 mmol/L (ref 98–111)
Creatinine, Ser: 1.01 mg/dL (ref 0.61–1.24)
GFR calc Af Amer: >60 mL/min (ref >60)
GFR calc non Af Amer: >60 mL/min (ref >60)
Glucose, Bld: 108 mg/dL — ABNORMAL HIGH (ref 70–99)
Potassium: 3.9 mmol/L (ref 3.5–5.1)
Sodium: 138 mmol/L (ref 135–145)

## 2019-09-05 LAB — MAGNESIUM: Magnesium: 1.7 mg/dL (ref 1.7–2.4)

## 2019-09-05 MED ORDER — SODIUM CHLORIDE 0.9 % IV SOLN
3.0000 g | Freq: Three times a day (TID) | INTRAVENOUS | Status: DC
  Administered 2019-09-05 – 2019-09-06 (×3): 3 g via INTRAVENOUS
  Filled 2019-09-05 (×2): qty 8
  Filled 2019-09-05 (×2): qty 3

## 2019-09-05 NOTE — Progress Notes (Signed)
PROGRESS NOTE        PATIENT DETAILS Name: Jerry Wong Age: 74 y.o. Sex: male Date of Birth: Jul 23, 1945 Admit Date: 09/02/2019 Admitting Physician Norval Morton, MD [facility]:ENIDP, Leticia Penna, NP  Brief Narrative: Patient is a 74 y.o. male HTN, HLD, prostate cancer s/p prostatectomy/radiation/Eligard, remote history of tobacco use, recently diagnosed with right lower lobe lung mass on 6/30-who presented with fever, cough in spite of being on oral antimicrobial therapy prior to this hospital stay-found to have sepsis with acute hypoxic respiratory failure secondary to postobstructive pneumonia -and admitted to the hospitalist service.  See below for further details.  Significant events: 7/8>> admit to [facility] for sepsis/hypoxia from postobstructive pneumonia.  Recently diagnosed with lung mass.  Significant studies: 7/8>> CT chest: 5.1 x 3.9 cm right lung hilar mass, necrotic appearing mediastinal/hilar lymph nodes.  Indeterminate 2.3 cm left adrenal mass. 7/9>> chest x-ray: Stable right hilar and infrahilar mass lesion similar to that seen on prior CT.  Antimicrobial therapy: Vancomycin: 7/8>> 7/10 Flagyl: 7/8>> 7/11 Cefepime: 7/8>> 7/11 Unasyn: 7/11>>  Microbiology data: 7/9: Blood culture>> no growth.  Procedures : None  Consults: PCCM  DVT Prophylaxis : enoxaparin (LOVENOX) injection 40 mg Start: 09/03/19 1400  Subjective: Breathing much better-lying comfortably in bed-no major issues overnight.  Assessment/Plan: Sepsis with acute hypoxic respiratory failure secondary to postobstructive pneumonia: Sepsis physiology has resolved-stable on just 2 L of oxygen this morning-cultures negative so far, WBC only minimally elevated.  Stop cefepime/Flagyl today-we will transition to Unasyn.   AKI: Likely hemodynamically mediated-AKI has resolved-back to baseline.  Right lung mass-suspicion for bronchogenic carcinoma: PCCM consulted-for bronchoscopy tentatively  on 7/12.  Hypokalemia: Repleted.  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  Insomnia: Continue Ambien   Diet: Diet Order            Diet NPO time specified  Diet effective now           Diet Heart Room service appropriate? Yes; Fluid consistency: Thin  Diet effective now                  Code Status: Full code   Family Communication: Offered to call family-patient prefers to update himself.  Disposition Plan: Status is: Inpatient  Remains inpatient appropriate because:Inpatient level of care appropriate due to severity of illness  Dispo: The patient is from: Home              Anticipated d/c is to: Home              Anticipated d/c date is: 3 days              Patient currently is not medically stable to d/c.  Barriers to Discharge: Sepsis/hypoxia-secondary to postobstructive pneumonia-failed outpatient oral antibiotics-on IV antibiotics-bronchoscopy scheduled for 7/12.  Antimicrobial agents: Anti-infectives (From admission, onward)   Start     Dose/Rate Route Frequency Ordered Stop   09/04/19 1800  vancomycin (VANCOREADY) IVPB 1250 mg/250 mL  Status:  Discontinued        1,250 mg 166.7 mL/hr over 90 Minutes Intravenous Every 12 hours 09/04/19 1419 09/04/19 1458   09/04/19 1430  ceFEPIme (MAXIPIME) 2 g in  sodium chloride 0.9 % 100 mL IVPB     Discontinue     2 g 200 mL/hr over 30 Minutes Intravenous Every 8 hours 09/04/19 1419     09/03/19 1800  vancomycin (VANCOREADY) IVPB 750 mg/150 mL  Status:  Discontinued        750 mg 150 mL/hr over 60 Minutes Intravenous Every 12 hours 09/03/19 0752 09/04/19 1419   09/03/19 1600  ceFEPIme (MAXIPIME) 2 g in sodium chloride 0.9 % 100 mL IVPB  Status:  Discontinued        2 g 200 mL/hr over 30  Minutes Intravenous Every 12 hours 09/03/19 0752 09/04/19 1419   09/03/19 1400  metroNIDAZOLE (FLAGYL) IVPB 500 mg     Discontinue     500 mg 100 mL/hr over 60 Minutes Intravenous Every 8 hours 09/03/19 0728     09/03/19 0245  ceFEPIme (MAXIPIME) 2 g in sodium chloride 0.9 % 100 mL IVPB        2 g 200 mL/hr over 30 Minutes Intravenous  Once 09/03/19 0233 09/03/19 0419   09/03/19 0245  metroNIDAZOLE (FLAGYL) IVPB 500 mg        500 mg 100 mL/hr over 60 Minutes Intravenous  Once 09/03/19 0233 09/03/19 0524   09/03/19 0245  vancomycin (VANCOCIN) IVPB 1000 mg/200 mL premix  Status:  Discontinued        1,000 mg 200 mL/hr over 60 Minutes Intravenous  Once 09/03/19 0233 09/03/19 0233   09/03/19 0245  vancomycin (VANCOREADY) IVPB 2000 mg/400 mL        2,000 mg 200 mL/hr over 120 Minutes Intravenous  Once 09/03/19 0234 09/03/19 0947       Time spent: 35- minutes-Greater than 50% of this time was spent in counseling, explanation of diagnosis, planning of further management, and coordination of care.  MEDICATIONS: Scheduled Meds: . amitriptyline  75 mg Oral QHS  . amLODipine  10 mg Oral Daily  . aspirin EC  81 mg Oral Daily  . atorvastatin  20 mg Oral QHS  . enoxaparin (LOVENOX) injection  40 mg Subcutaneous Q24H  . guaiFENesin  600 mg Oral BID  . mouth rinse  15 mL Mouth Rinse BID  . pregabalin  100 mg Oral BID   Continuous Infusions: . sodium chloride 10 mL/hr at 09/05/19 0619  . sodium chloride 10 mL/hr at 09/05/19 0618  . ceFEPime (MAXIPIME) IV 2 g (09/05/19 2694)  . metronidazole 500 mg (09/05/19 0622)   PRN Meds:.sodium chloride, sodium chloride, acetaminophen, oxyCODONE-acetaminophen **AND** oxyCODONE, tiZANidine, zolpidem   PHYSICAL EXAM: Vital signs: Vitals:   09/04/19 2011 09/04/19 2113 09/05/19 0515 09/05/19 0750  BP: (!) 93/55 (!) 114/54 113/71 117/67  Pulse: 74 70 88 83  Resp: 16 17 17 20   Temp: 98.5 F (36.9 C) 98.2 F (36.8 C) 99.1 F (37.3 C) 99.6 F (37.6  C)  TempSrc: Oral Oral Oral Oral  SpO2: 92% 92% 96% 93%  Weight: 94.8 kg     Height: 5\' 9"  (1.753 m)      Filed Weights   09/04/19 2011  Weight: 94.8 kg   Body mass index is 30.86 kg/m.   Gen Exam:Alert awake-not in any distress HEENT:atraumatic, normocephalic Chest: B/L clear to auscultation anteriorly CVS:S1S2 regular Abdomen:soft non tender, non distended Extremities:no edema Neurology: Non focal Skin: no rash  I have personally reviewed following labs and imaging studies  LABORATORY DATA: CBC: Recent Labs  Lab 09/02/19 1002 09/02/19 1711 09/04/19 0741 09/05/19 0718  WBC 16.9*  16.4* 11.6* 12.4*  NEUTROABS 13.3* 11.9*  --   --   HGB 11.3* 11.5* 10.3* 10.7*  HCT 34.4* 35.5* 32.8* 33.4*  MCV 80.6 81.4 82.8 83.3  PLT 358.0 367 286 026    Basic Metabolic Panel: Recent Labs  Lab 09/02/19 1002 09/02/19 1711 09/04/19 0741 09/05/19 0718  NA 132* 133* 139 138  K 3.8 3.7 2.9* 3.9  CL 95* 97* 102 105  CO2 28 21* 25 24  GLUCOSE 130* 131* 107* 108*  BUN 29* 24* 15 14  CREATININE 1.52* 1.51* 1.03 1.01  CALCIUM 9.1 8.6* 8.4* 8.5*  MG  --   --   --  1.7    GFR: Estimated Creatinine Clearance: 76.2 mL/min (by C-G formula based on SCr of 1.01 mg/dL).  Liver Function Tests: Recent Labs  Lab 09/02/19 1711 09/04/19 0741  AST 21 19  ALT 19 18  ALKPHOS 66 57  BILITOT 1.0 0.6  PROT 6.8 6.1*  ALBUMIN 3.1* 2.4*   No results for input(s): LIPASE, AMYLASE in the last 168 hours. No results for input(s): AMMONIA in the last 168 hours.  Coagulation Profile: No results for input(s): INR, PROTIME in the last 168 hours.  Cardiac Enzymes: No results for input(s): CKTOTAL, CKMB, CKMBINDEX, TROPONINI in the last 168 hours.  BNP (last 3 results) No results for input(s): PROBNP in the last 8760 hours.  Lipid Profile: No results for input(s): CHOL, HDL, LDLCALC, TRIG, CHOLHDL, LDLDIRECT in the last 72 hours.  Thyroid Function Tests: No results for input(s): TSH,  T4TOTAL, FREET4, T3FREE, THYROIDAB in the last 72 hours.  Anemia Panel: No results for input(s): VITAMINB12, FOLATE, FERRITIN, TIBC, IRON, RETICCTPCT in the last 72 hours.  Urine analysis:    Component Value Date/Time   COLORURINE YELLOW 09/03/2019 0426   APPEARANCEUR CLEAR 09/03/2019 0426   LABSPEC 1.035 (H) 09/03/2019 0426   PHURINE 5.0 09/03/2019 0426   GLUCOSEU NEGATIVE 09/03/2019 0426   HGBUR MODERATE (A) 09/03/2019 0426   BILIRUBINUR NEGATIVE 09/03/2019 0426   KETONESUR NEGATIVE 09/03/2019 0426   PROTEINUR NEGATIVE 09/03/2019 0426   NITRITE NEGATIVE 09/03/2019 0426   LEUKOCYTESUR NEGATIVE 09/03/2019 0426    Sepsis Labs: Lactic Acid, Venous    Component Value Date/Time   LATICACIDVEN 1.2 09/03/2019 0906    MICROBIOLOGY: Recent Results (from the past 240 hour(s))  Culture, blood (Routine X 2) w Reflex to ID Panel     Status: None (Preliminary result)   Collection Time: 09/03/19  1:51 AM   Specimen: BLOOD RIGHT HAND  Result Value Ref Range Status   Specimen Description BLOOD RIGHT HAND  Final   Special Requests   Final    BOTTLES DRAWN AEROBIC AND ANAEROBIC Blood Culture adequate volume   Culture   Final    NO GROWTH 1 DAY Performed at [facility], [place] [address], [place], [place] [address]    Report Status PENDING  Incomplete  Culture, blood (Routine X 2) w Reflex to ID Panel     Status: None (Preliminary result)   Collection Time: 09/03/19  2:37 AM   Specimen: BLOOD  Result Value Ref Range Status   Specimen Description BLOOD LEFT ANTECUBITAL  Final   Special Requests   Final    BOTTLES DRAWN AEROBIC AND ANAEROBIC Blood Culture results may not be optimal due to an inadequate volume of blood received in culture bottles   Culture   Final    NO GROWTH 1 DAY Performed at [facility], [address] [place], [place] [address]    Report Status PENDING  Incomplete  SARS Coronavirus 2 by RT PCR (hospital order, performed in [facility] hospital lab)  Nasopharyngeal Nasopharyngeal Swab     Status: None   Collection Time: 09/03/19  4:13 AM   Specimen: Nasopharyngeal Swab  Result Value Ref Range Status   SARS Coronavirus 2 NEGATIVE NEGATIVE Final    Comment: (NOTE) SARS-CoV-2 target nucleic acids are NOT DETECTED.  The SARS-CoV-2 RNA is generally detectable in upper and lower respiratory specimens during the acute phase of infection. The lowest concentration of SARS-CoV-2 viral copies this assay can detect is 250 copies / mL. A negative result does not preclude SARS-CoV-2 infection and should not be used as the sole basis for treatment or other patient management decisions.  A negative result may occur with improper specimen collection / handling, submission of specimen other than nasopharyngeal swab, presence of viral mutation(s) within the areas targeted by this assay, and inadequate number of viral copies (<250 copies / mL). A negative result must be combined with clinical observations, patient history, and epidemiological information.  Fact Sheet for Patients:   StrictlyIdeas.no  Fact Sheet for Healthcare Providers: BankingDealers.co.za  This test is not yet approved or  cleared by the [place] FDA and has been authorized for detection and/or diagnosis of SARS-CoV-2 by FDA under an Emergency Use Authorization (EUA).  This EUA will remain in effect (meaning this test can be used) for the duration of the COVID-19 declaration under Section 564(b)(1) of the Act, 21 U.S.C. section 360bbb-3(b)(1), unless the authorization is terminated or revoked sooner.  Performed at [facility], [place] [address], [place], [place] [address]     RADIOLOGY STUDIES/RESULTS: No results found.   LOS: 2 days   Oren Binet, MD  [facility]    To contact the attending provider between 7A-7P or the covering provider during after hours 7P-7A, please log into the web site  [email or web address] and access using universal [place] password for that web site. If you do not have the password, please call the hospital operator.  09/05/2019, 1:46 PM

## 2019-09-05 NOTE — Anesthesia Preprocedure Evaluation (Addendum)
Anesthesia Evaluation  Patient identified by MRN, date of birth, ID band Patient awake    Reviewed: Allergy & Precautions, NPO status , Patient's Chart, lab work & pertinent test results  Airway Mallampati: II  TM Distance: >3 FB Neck ROM: Full    Dental no notable dental hx. (+) Dental Advisory Given, Poor Dentition, Chipped, Missing,    Pulmonary COPD,  COPD inhaler, former smoker,    Pulmonary exam normal breath sounds clear to auscultation       Cardiovascular hypertension, Pt. on medications Normal cardiovascular exam Rhythm:Regular Rate:Normal     Neuro/Psych negative neurological ROS  negative psych ROS   GI/Hepatic negative GI ROS, Neg liver ROS,   Endo/Other  negative endocrine ROS  Renal/GU Renal disease     Musculoskeletal  (+) Arthritis ,   Abdominal   Peds negative pediatric ROS (+)  Hematology negative hematology ROS (+)   Anesthesia Other Findings   Reproductive/Obstetrics negative OB ROS                           Anesthesia Physical Anesthesia Plan  ASA: III  Anesthesia Plan: General   Post-op Pain Management:    Induction: Intravenous  PONV Risk Score and Plan: Treatment may vary due to age or medical condition, Ondansetron and Dexamethasone  Airway Management Planned: Oral ETT  Additional Equipment: None  Intra-op Plan:   Post-operative Plan: Extubation in OR  Informed Consent: I have reviewed the patients History and Physical, chart, labs and discussed the procedure including the risks, benefits and alternatives for the proposed anesthesia with the patient or authorized representative who has indicated his/her understanding and acceptance.     Dental advisory given  Plan Discussed with:   Anesthesia Plan Comments:        Anesthesia Quick Evaluation

## 2019-09-05 NOTE — Progress Notes (Addendum)
Pharmacy Antibiotic Note  Jerry Wong is a 74 y.o. male admitted on 09/02/2019 with sepsis thought to be secondary to post obstructive pneumonia.  Pharmacy has been consulted for Unasyn dosing. This is day 3 of antimicrobial therapy and the patient continues to improve clinically. Pt is afebrile with improved WBC.   Plan: Unasyn 3g IV every 8 hours  Height: 5\' 9"  (175.3 cm) Weight: 94.8 kg (209 lb) IBW/kg (Calculated) : 70.7  Temp (24hrs), Avg:98.9 F (37.2 C), Min:98.2 F (36.8 C), Max:99.6 F (37.6 C)  Recent Labs  Lab 09/02/19 1002 09/02/19 1711 09/03/19 0236 09/03/19 0624 09/03/19 0906 09/04/19 0741 09/05/19 0718  WBC 16.9* 16.4*  --   --   --  11.6* 12.4*  CREATININE 1.52* 1.51*  --   --   --  1.03 1.01  LATICACIDVEN  --   --  2.0* 1.6 1.2  --   --     Estimated Creatinine Clearance: 76.2 mL/min (by C-G formula based on SCr of 1.01 mg/dL).    No Known Allergies  Antimicrobials this admission: Vanc 7/9>> 7/10 Cefepime 7/9>>7/11 Flagyl 7/9>>7/11  Dose adjustments this admission: 7/10: vancomycin 750 q12h adjusted to 1250mg  q12h for improved renal function 7/10: cefepime 2g q12h adjusted to 2g q8h for improved renal function  Microbiology results: 7/9 BCx: no growth x1 day 7/9 COVID: negative  Thank you for allowing pharmacy to be a part of this patient's care.  Wilson Singer, PharmD PGY1 Pharmacy Resident 09/05/2019 2:14 PM

## 2019-09-06 ENCOUNTER — Other Ambulatory Visit: Payer: Self-pay

## 2019-09-06 ENCOUNTER — Encounter (HOSPITAL_COMMUNITY): Payer: Self-pay | Admitting: Internal Medicine

## 2019-09-06 ENCOUNTER — Inpatient Hospital Stay (HOSPITAL_COMMUNITY): Payer: Medicare Other | Admitting: Anesthesiology

## 2019-09-06 ENCOUNTER — Encounter (HOSPITAL_COMMUNITY)
Admission: EM | Disposition: A | Discharge status: Home / Self Care | Payer: Self-pay | Source: Home / Self Care | Attending: Internal Medicine

## 2019-09-06 DIAGNOSIS — J189 Pneumonia, unspecified organism: Secondary | ICD-10-CM

## 2019-09-06 HISTORY — PX: BRONCHIAL NEEDLE ASPIRATION BIOPSY: SHX5106

## 2019-09-06 HISTORY — PX: BRONCHIAL WASHINGS: SHX5105

## 2019-09-06 HISTORY — PX: VIDEO BRONCHOSCOPY WITH ENDOBRONCHIAL ULTRASOUND: SHX6177

## 2019-09-06 LAB — BODY FLUID CELL COUNT WITH DIFFERENTIAL
Other Cells, Fluid: NONE SEEN %
Total Nucleated Cell Count, Fluid: 1 cu mm (ref 0–1000)

## 2019-09-06 SURGERY — BRONCHOSCOPY, WITH EBUS
Anesthesia: General

## 2019-09-06 MED ORDER — PROPOFOL 10 MG/ML IV BOLUS
INTRAVENOUS | Status: DC | PRN
  Administered 2019-09-06: 120 mg via INTRAVENOUS

## 2019-09-06 MED ORDER — SUGAMMADEX SODIUM 200 MG/2ML IV SOLN
INTRAVENOUS | Status: DC | PRN
  Administered 2019-09-06: 200 mg via INTRAVENOUS

## 2019-09-06 MED ORDER — ROCURONIUM BROMIDE 10 MG/ML (PF) SYRINGE
PREFILLED_SYRINGE | INTRAVENOUS | Status: DC | PRN
  Administered 2019-09-06: 50 mg via INTRAVENOUS

## 2019-09-06 MED ORDER — FENTANYL CITRATE (PF) 100 MCG/2ML IJ SOLN
INTRAMUSCULAR | Status: AC
  Filled 2019-09-06: qty 2

## 2019-09-06 MED ORDER — LIDOCAINE 2% (20 MG/ML) 5 ML SYRINGE
INTRAMUSCULAR | Status: DC | PRN
  Administered 2019-09-06: 100 mg via INTRAVENOUS

## 2019-09-06 MED ORDER — LACTATED RINGERS IV SOLN: INTRAVENOUS | Status: DC

## 2019-09-06 MED ORDER — AMOXICILLIN-POT CLAVULANATE 875-125 MG PO TABS
1.0000 | ORAL_TABLET | Freq: Two times a day (BID) | ORAL | Status: DC
  Administered 2019-09-06 – 2019-09-07 (×2): 1 via ORAL
  Filled 2019-09-06 (×2): qty 1

## 2019-09-06 MED ORDER — FENTANYL CITRATE (PF) 250 MCG/5ML IJ SOLN
INTRAMUSCULAR | Status: DC | PRN
  Administered 2019-09-06: 50 ug via INTRAVENOUS

## 2019-09-06 MED ORDER — ONDANSETRON HCL 4 MG/2ML IJ SOLN
INTRAMUSCULAR | Status: DC | PRN
  Administered 2019-09-06: 4 mg via INTRAVENOUS

## 2019-09-06 SURGICAL SUPPLY — 36 items
ADAPTER VALVE BIOPSY EBUS (MISCELLANEOUS) IMPLANT
ADPTR VALVE BIOPSY EBUS (MISCELLANEOUS)
BRUSH CYTOL CELLEBRITY 1.5X140 (MISCELLANEOUS) IMPLANT
CANISTER SUCT 3000ML PPV (MISCELLANEOUS) ×4 IMPLANT
CONT SPEC 4OZ CLIKSEAL STRL BL (MISCELLANEOUS) ×4 IMPLANT
COVER BACK TABLE 60X90IN (DRAPES) ×4 IMPLANT
FORCEPS BIOP RJ4 1.8 (CUTTING FORCEPS) IMPLANT
GAUZE SPONGE 4X4 12PLY STRL (GAUZE/BANDAGES/DRESSINGS) ×4 IMPLANT
GLOVE BIO SURGEON STRL SZ7.5 (GLOVE) ×4 IMPLANT
GOWN STRL REUS W/ TWL LRG LVL3 (GOWN DISPOSABLE) ×2 IMPLANT
GOWN STRL REUS W/ TWL XL LVL3 (GOWN DISPOSABLE) ×2 IMPLANT
GOWN STRL REUS W/TWL LRG LVL3 (GOWN DISPOSABLE) ×4
GOWN STRL REUS W/TWL XL LVL3 (GOWN DISPOSABLE) ×4
KIT CLEAN ENDO COMPLIANCE (KITS) ×8 IMPLANT
KIT TURNOVER KIT B (KITS) ×4 IMPLANT
MARKER SKIN DUAL TIP RULER LAB (MISCELLANEOUS) ×4 IMPLANT
NDL ASPIRATION VIZISHOT 19G (NEEDLE) IMPLANT
NDL ASPIRATION VIZISHOT 21G (NEEDLE) IMPLANT
NEEDLE ASPIRATION VIZISHOT 19G (NEEDLE) IMPLANT
NEEDLE ASPIRATION VIZISHOT 21G (NEEDLE) IMPLANT
NS IRRIG 1000ML POUR BTL (IV SOLUTION) ×4 IMPLANT
OIL SILICONE PENTAX (PARTS (SERVICE/REPAIRS)) ×4 IMPLANT
PAD ARMBOARD 7.5X6 YLW CONV (MISCELLANEOUS) ×8 IMPLANT
SYR 20ML ECCENTRIC (SYRINGE) ×8 IMPLANT
SYR 20ML LL LF (SYRINGE) ×8 IMPLANT
SYR 50ML SLIP (SYRINGE) IMPLANT
SYR 5ML LUER SLIP (SYRINGE) ×4 IMPLANT
TOWEL GREEN STERILE FF (TOWEL DISPOSABLE) ×4 IMPLANT
TRAP SPECIMEN MUCOUS 40CC (MISCELLANEOUS) IMPLANT
TUBE CONNECTING 20'X1/4 (TUBING) ×2
TUBE CONNECTING 20X1/4 (TUBING) ×6 IMPLANT
UNDERPAD 30X30 (UNDERPADS AND DIAPERS) ×4 IMPLANT
VALVE BIOPSY  SINGLE USE (MISCELLANEOUS) ×4
VALVE BIOPSY SINGLE USE (MISCELLANEOUS) ×2 IMPLANT
VALVE SUCTION BRONCHIO DISP (MISCELLANEOUS) ×4 IMPLANT
WATER STERILE IRR 1000ML POUR (IV SOLUTION) ×4 IMPLANT

## 2019-09-06 NOTE — Plan of Care (Signed)
  Problem: Education: Goal: Knowledge of General Education information will improve Description: Including pain rating scale, medication(s)/side effects and non-pharmacologic comfort measures Outcome: Not Progressing   Problem: Clinical Measurements: Goal: Ability to maintain clinical measurements within normal limits will improve Outcome: Not Progressing Goal: Will remain free from infection Outcome: Not Progressing Goal: Diagnostic test results will improve Outcome: Not Progressing Goal: Respiratory complications will improve Outcome: Not Progressing Goal: Cardiovascular complication will be avoided Outcome: Not Progressing

## 2019-09-06 NOTE — Anesthesia Postprocedure Evaluation (Signed)
Anesthesia Post Note  Patient: Jerry Wong  Procedure(s) Performed: VIDEO BRONCHOSCOPY WITH ENDOBRONCHIAL ULTRASOUND (N/A ) BRONCHIAL WASHINGS BRONCHIAL NEEDLE ASPIRATION BIOPSIES     Patient location during evaluation: PACU Anesthesia Type: General Level of consciousness: awake and alert Pain management: pain level controlled Vital Signs Assessment: post-procedure vital signs reviewed and stable Respiratory status: spontaneous breathing, nonlabored ventilation, respiratory function stable and patient connected to nasal cannula oxygen Cardiovascular status: blood pressure returned to baseline and stable Postop Assessment: no apparent nausea or vomiting Anesthetic complications: no   No complications documented.  Last Vitals:  Vitals:   09/06/19 1232 09/06/19 1250  BP: (!) 102/45 108/60  Pulse: 77 77  Resp: 18 16  Temp: 37.1 C   SpO2: 95% 92%    Last Pain:  Vitals:   09/06/19 1250  TempSrc:   PainSc: 0-No pain                 Barnet Glasgow

## 2019-09-06 NOTE — Progress Notes (Signed)
Seen post bronch. Doing well. Would give him about 5 more days augmentin (ordered). I will call him with bronch results and next steps.  Erskine Emery MD PCCM

## 2019-09-06 NOTE — Transfer of Care (Signed)
Immediate Anesthesia Transfer of Care Note  Patient: Jerry Wong  Procedure(s) Performed: VIDEO BRONCHOSCOPY WITH ENDOBRONCHIAL ULTRASOUND (N/A ) BRONCHIAL WASHINGS BRONCHIAL NEEDLE ASPIRATION BIOPSIES  Patient Location: Endoscopy Unit  Anesthesia Type:General  Level of Consciousness: awake, alert  and oriented  Airway & Oxygen Therapy: Patient Spontanous Breathing and Patient connected to nasal cannula oxygen  Post-op Assessment: Report given to RN and Post -op Vital signs reviewed and stable  Post vital signs: Reviewed and stable  Last Vitals:  Vitals Value Taken Time  BP 114/57 09/06/19 1223  Temp    Pulse 84 09/06/19 1223  Resp 20 09/06/19 1223  SpO2 97 % 09/06/19 1223  Vitals shown include unvalidated device data.  Last Pain:  Vitals:   09/06/19 1045  TempSrc: Temporal  PainSc: 0-No pain      Patients Stated Pain Goal: 0 (11/46/43 1427)  Complications: No complications documented.

## 2019-09-06 NOTE — Interval H&P Note (Signed)
History and Physical Interval Note:  09/06/2019 11:28 AM  Jerry Wong  has presented today for surgery, with the diagnosis of lung mass, mediastinal adenopathy.  The various methods of treatment have been discussed with the patient and family. After consideration of risks, benefits and other options for treatment, the patient has consented to  Procedure(s): [place] (N/A) as a surgical intervention.  The patient's history has been reviewed, patient examined, no change in status, stable for surgery.  I have reviewed the patient's chart and labs.  Questions were answered to the patient's satisfaction.     No events overnight. Breathing more comfortably. Lungs diminished on exam. Malampatti 1. Plan to proceed with bronch/biopsy today and further plans depending on tolerance and path. Continue antibiotics, will send BAL  Erskine Emery MD [place]

## 2019-09-06 NOTE — Progress Notes (Signed)
PROGRESS NOTE        PATIENT DETAILS Name: Jerry Wong Age: 74 y.o. Sex: male Date of Birth: April 27, 1945 Admit Date: 09/02/2019 Admitting Physician Norval Morton, MD VOJ:[id number], Leticia Penna, NP  Brief Narrative: Patient is a 74 y.o. male HTN, HLD, prostate cancer s/p prostatectomy/radiation/Eligard, remote history of tobacco use, recently diagnosed with right lower lobe lung mass on 6/30-who presented with fever, cough in spite of being on oral antimicrobial therapy prior to this hospital stay-found to have sepsis with acute hypoxic respiratory failure secondary to postobstructive pneumonia -and admitted to the hospitalist service.  See below for further details.  Significant events: 7/8>> admit to [facility] for sepsis/hypoxia from postobstructive pneumonia.  Recently diagnosed with lung mass.  Significant studies: 7/8>> CT chest: 5.1 x 3.9 cm right lung hilar mass, necrotic appearing mediastinal/hilar lymph nodes.  Indeterminate 2.3 cm left adrenal mass. 7/9>> chest x-ray: Stable right hilar and infrahilar mass lesion similar to that seen on prior CT.  Antimicrobial therapy: Vancomycin: 7/8>> 7/10 Flagyl: 7/8>> 7/11 Cefepime: 7/8>> 7/11 Unasyn: 7/11>>  Microbiology data: 7/9: Blood culture>> no growth. 7/12: BAL culture>> pending  Procedures : 7/12>> bronchoscopy  Consults: PCCM  DVT Prophylaxis : enoxaparin (LOVENOX) injection 40 mg Start: 09/03/19 1400  Subjective: Seen earlier this morning before bronchoscopy-felt better-main complaint is pain in the left foot-plantar aspect-near fifth toe.  Assessment/Plan: Sepsis with acute hypoxic respiratory failure secondary to postobstructive pneumonia: Sepsis physiology has resolved-has been transitioned to room air-continue Unasyn-blood culture remains negative-BAL cultures on 7/12 pending.  Follow closely-but suspect if clinical improvement continues-potentially could be discharged in the next day or so.    AKI: Likely hemodynamically mediated-AKI has resolved-back to baseline.  Right lung mass-suspicion for bronchogenic carcinoma: PCCM consulted-s/p bronchoscopy and 7/12.  Biopsy pending-we will need to be followed.  Hypokalemia: Repleted.  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  History of prostate cancer: Continue follow-up with urology at [facility]  Chronic pain/neuropathy: Continue Lyrica and oxycodone as needed.  Insomnia: Continue Ambien   Diet: Diet Order            Diet regular Room service appropriate? Yes; Fluid consistency: Thin  Diet effective now                  Code Status: Full code   Family Communication: Offered to call family-patient prefers to update himself.  Disposition Plan: Status is: Inpatient  Remains inpatient appropriate because:Inpatient level of care appropriate due to severity of illness  Dispo: The patient is from: Home              Anticipated d/c is to: Home              Anticipated d/c date is: 7/13              Patient currently is not medically stable to d/c.  Barriers to Discharge: Sepsis/hypoxia-secondary to postobstructive pneumonia-failed outpatient oral antibiotics-on IV antibiotics-bronchoscopy scheduled for 7/12.  Antimicrobial agents: Anti-infectives (From admission, onward)   Start     Dose/Rate Route Frequency Ordered Stop   09/06/19 2200  amoxicillin-clavulanate (AUGMENTIN) 875-125 MG per tablet 1 tablet     Discontinue     1 tablet Oral Every 12 hours 09/06/19  1228 09/11/19 2159   09/05/19 1600  Ampicillin-Sulbactam (UNASYN) 3 g in sodium chloride 0.9 % 100 mL IVPB  Status:  Discontinued        3 g 200 mL/hr over 30 Minutes Intravenous Every 8 hours 09/05/19 1417 09/06/19 1228   09/04/19 1800  vancomycin (VANCOREADY) IVPB 1250 mg/250 mL  Status:  Discontinued         1,250 mg 166.7 mL/hr over 90 Minutes Intravenous Every 12 hours 09/04/19 1419 09/04/19 1458   09/04/19 1430  ceFEPIme (MAXIPIME) 2 g in sodium chloride 0.9 % 100 mL IVPB  Status:  Discontinued        2 g 200 mL/hr over 30 Minutes Intravenous Every 8 hours 09/04/19 1419 09/05/19 1349   09/03/19 1800  vancomycin (VANCOREADY) IVPB 750 mg/150 mL  Status:  Discontinued        750 mg 150 mL/hr over 60 Minutes Intravenous Every 12 hours 09/03/19 0752 09/04/19 1419   09/03/19 1600  ceFEPIme (MAXIPIME) 2 g in sodium chloride 0.9 % 100 mL IVPB  Status:  Discontinued        2 g 200 mL/hr over 30 Minutes Intravenous Every 12 hours 09/03/19 0752 09/04/19 1419   09/03/19 1400  metroNIDAZOLE (FLAGYL) IVPB 500 mg  Status:  Discontinued        500 mg 100 mL/hr over 60 Minutes Intravenous Every 8 hours 09/03/19 0728 09/05/19 1349   09/03/19 0245  ceFEPIme (MAXIPIME) 2 g in sodium chloride 0.9 % 100 mL IVPB        2 g 200 mL/hr over 30 Minutes Intravenous  Once 09/03/19 0233 09/03/19 0419   09/03/19 0245  metroNIDAZOLE (FLAGYL) IVPB 500 mg        500 mg 100 mL/hr over 60 Minutes Intravenous  Once 09/03/19 0233 09/03/19 0524   09/03/19 0245  vancomycin (VANCOCIN) IVPB 1000 mg/200 mL premix  Status:  Discontinued        1,000 mg 200 mL/hr over 60 Minutes Intravenous  Once 09/03/19 0233 09/03/19 0233   09/03/19 0245  vancomycin (VANCOREADY) IVPB 2000 mg/400 mL        2,000 mg 200 mL/hr over 120 Minutes Intravenous  Once 09/03/19 0234 09/03/19 0947       Time spent: 35- minutes-Greater than 50% of this time was spent in counseling, explanation of diagnosis, planning of further management, and coordination of care.  MEDICATIONS: Scheduled Meds: . amitriptyline  75 mg Oral QHS  . amLODipine  10 mg Oral Daily  . amoxicillin-clavulanate  1 tablet Oral Q12H  . aspirin EC  81 mg Oral Daily  . atorvastatin  20 mg Oral QHS  . enoxaparin (LOVENOX) injection  40 mg Subcutaneous Q24H  . guaiFENesin  600 mg  Oral BID  . mouth rinse  15 mL Mouth Rinse BID  . pregabalin  100 mg Oral BID   Continuous Infusions: . sodium chloride 10 mL/hr at 09/05/19 0619  . sodium chloride 10 mL/hr at 09/05/19 0618   PRN Meds:.sodium chloride, sodium chloride, acetaminophen, oxyCODONE-acetaminophen **AND** oxyCODONE, tiZANidine, zolpidem   PHYSICAL EXAM: Vital signs: Vitals:   09/06/19 1045 09/06/19 1223 09/06/19 1232 09/06/19 1250  BP: 116/65 (!) 114/57 (!) 102/45 108/60  Pulse: 77 81 77 77  Resp: (!) _0 Temp: 98.2 F (36.8 C)  98.7 F (37.1 C)   TempSrc: Temporal  Oral   SpO2: 96% 95% 95% 92%  Weight:      Height:  Filed Weights   09/04/19 2011 09/06/19 0517  Weight: 94.8 kg 96.9 kg   Body mass index is 31.55 kg/m.   Gen Exam:Alert awake-not in any distress HEENT:atraumatic, normocephalic Chest: B/L clear to auscultation anteriorly CVS:S1S2 regular Abdomen:soft non tender, non distended Extremities:no edema Neurology: Non focal Skin: no rash  I have personally reviewed following labs and imaging studies  LABORATORY DATA: CBC: Recent Labs  Lab 09/02/19 1002 09/02/19 1711 09/04/19 0741 09/05/19 0718  WBC 16.9* 16.4* 11.6* 12.4*  NEUTROABS 13.3* 11.9*  --   --   HGB 11.3* 11.5* 10.3* 10.7*  HCT 34.4* 35.5* 32.8* 33.4*  MCV 80.6 81.4 82.8 83.3  PLT 358.0 367 286 470    Basic Metabolic Panel: Recent Labs  Lab 09/02/19 1002 09/02/19 1711 09/04/19 0741 09/05/19 0718  NA 132* 133* 139 138  K 3.8 3.7 2.9* 3.9  CL 95* 97* 102 105  CO2 28 21* 25 24  GLUCOSE 130* 131* 107* 108*  BUN 29* 24* 15 14  CREATININE 1.52* 1.51* 1.03 1.01  CALCIUM 9.1 8.6* 8.4* 8.5*  MG  --   --   --  1.7    GFR: Estimated Creatinine Clearance: 77 mL/min (by C-G formula based on SCr of 1.01 mg/dL).  Liver Function Tests: Recent Labs  Lab 09/02/19 1711 09/04/19 0741  AST 21 19  ALT 19 18  ALKPHOS 66 57  BILITOT 1.0 0.6  PROT 6.8 6.1*  ALBUMIN 3.1* 2.4*   No results for  input(s): LIPASE, AMYLASE in the last 168 hours. No results for input(s): AMMONIA in the last 168 hours.  Coagulation Profile: No results for input(s): INR, PROTIME in the last 168 hours.  Cardiac Enzymes: No results for input(s): CKTOTAL, CKMB, CKMBINDEX, TROPONINI in the last 168 hours.  BNP (last 3 results) No results for input(s): PROBNP in the last 8760 hours.  Lipid Profile: No results for input(s): CHOL, HDL, LDLCALC, TRIG, CHOLHDL, LDLDIRECT in the last 72 hours.  Thyroid Function Tests: No results for input(s): TSH, T4TOTAL, FREET4, T3FREE, THYROIDAB in the last 72 hours.  Anemia Panel: No results for input(s): VITAMINB12, FOLATE, FERRITIN, TIBC, IRON, RETICCTPCT in the last 72 hours.  Urine analysis:    Component Value Date/Time   COLORURINE YELLOW 09/03/2019 0426   APPEARANCEUR CLEAR 09/03/2019 0426   LABSPEC 1.035 (H) 09/03/2019 0426   PHURINE 5.0 09/03/2019 0426   GLUCOSEU NEGATIVE 09/03/2019 0426   HGBUR MODERATE (A) 09/03/2019 0426   BILIRUBINUR NEGATIVE 09/03/2019 0426   KETONESUR NEGATIVE 09/03/2019 0426   PROTEINUR NEGATIVE 09/03/2019 0426   NITRITE NEGATIVE 09/03/2019 0426   LEUKOCYTESUR NEGATIVE 09/03/2019 0426    Sepsis Labs: Lactic Acid, Venous    Component Value Date/Time   LATICACIDVEN 1.2 09/03/2019 0906    MICROBIOLOGY: Recent Results (from the past 240 hour(s))  Culture, blood (Routine X 2) w Reflex to ID Panel     Status: None (Preliminary result)   Collection Time: 09/03/19  1:51 AM   Specimen: BLOOD RIGHT HAND  Result Value Ref Range Status   Specimen Description BLOOD RIGHT HAND  Final   Special Requests   Final    BOTTLES DRAWN AEROBIC AND ANAEROBIC Blood Culture adequate volume   Culture   Final    NO GROWTH 3 DAYS Performed at [facility], [place] [address] [place], [place] [address]    Report Status PENDING  Incomplete  Culture, blood (Routine X 2) w Reflex to ID Panel     Status: None (  Preliminary result)   Collection  Time: 09/03/19  2:37 AM   Specimen: BLOOD  Result Value Ref Range Status   Specimen Description BLOOD LEFT ANTECUBITAL  Final   Special Requests   Final    BOTTLES DRAWN AEROBIC AND ANAEROBIC Blood Culture results may not be optimal due to an inadequate volume of blood received in culture bottles   Culture   Final    NO GROWTH 3 DAYS Performed at [facility], [place] [address] [place], [place] [address]    Report Status PENDING  Incomplete  SARS Coronavirus 2 by RT PCR (hospital order, performed in [facility] hospital lab) Nasopharyngeal Nasopharyngeal Swab     Status: None   Collection Time: 09/03/19  4:13 AM   Specimen: Nasopharyngeal Swab  Result Value Ref Range Status   SARS Coronavirus 2 NEGATIVE NEGATIVE Final    Comment: (NOTE) SARS-CoV-2 target nucleic acids are NOT DETECTED.  The SARS-CoV-2 RNA is generally detectable in upper and lower respiratory specimens during the acute phase of infection. The lowest concentration of SARS-CoV-2 viral copies this assay can detect is 250 copies / mL. A negative result does not preclude SARS-CoV-2 infection and should not be used as the sole basis for treatment or other patient management decisions.  A negative result may occur with improper specimen collection / handling, submission of specimen other than nasopharyngeal swab, presence of viral mutation(s) within the areas targeted by this assay, and inadequate number of viral copies (<250 copies / mL). A negative result must be combined with clinical observations, patient history, and epidemiological information.  Fact Sheet for Patients:   StrictlyIdeas.no  Fact Sheet for Healthcare Providers: BankingDealers.co.za  This test is not yet approved or  cleared by the [place] FDA and has been authorized for detection and/or diagnosis of SARS-CoV-2 by FDA under an Emergency Use Authorization (EUA).  This EUA will remain in effect  (meaning this test can be used) for the duration of the COVID-19 declaration under Section 564(b)(1) of the Act, 21 U.S.C. section 360bbb-3(b)(1), unless the authorization is terminated or revoked sooner.  Performed at [facility], [place] [address] [place], [place] [address]     RADIOLOGY STUDIES/RESULTS: No results found.   LOS: 3 days   Oren Binet, MD  [facility]    To contact the attending provider between 7A-7P or the covering provider during after hours 7P-7A, please log into the web site [email or web address] and access using universal [place] password for that web site. If you do not have the password, please call the hospital operator.  09/06/2019, 1:36 PM

## 2019-09-06 NOTE — Plan of Care (Signed)

## 2019-09-06 NOTE — Anesthesia Procedure Notes (Signed)
Procedure Name: Intubation Date/Time: 09/06/2019 11:30 AM Performed by: Valda Favia, CRNA Pre-anesthesia Checklist: Patient identified, Emergency Drugs available, Suction available and Patient being monitored Patient Re-evaluated:Patient Re-evaluated prior to induction Oxygen Delivery Method: Circle System Utilized Preoxygenation: Pre-oxygenation with 100% oxygen Induction Type: IV induction Ventilation: Mask ventilation without difficulty and Oral airway inserted - appropriate to patient size Laryngoscope Size: Mac and 4 Grade View: Grade I Tube type: Oral Tube size: 8.5 mm Number of attempts: 1 Airway Equipment and Method: Stylet and Oral airway Placement Confirmation: ETT inserted through vocal cords under direct vision,  positive ETCO2 and breath sounds checked- equal and bilateral Secured at: 23 cm Tube secured with: Tape Dental Injury: Teeth and Oropharynx as per pre-operative assessment

## 2019-09-06 NOTE — Procedures (Signed)
Flexible and EBUS Bronchoscopy Procedure Note  Zaelyn Barbary  [id number]  08-26-1945  Date:09/06/19  Time:12:28 PM   Provider Performing:Verleen Stuckey C Tamala Julian   Procedure: Flexible bronchoscopy and EBUS Bronchoscopy  Indication(s) Lung mass, adenopathy  Consent Risks of the procedure as well as the alternatives and risks of each were explained to the patient and/or caregiver.  Consent for the procedure was obtained.  Anesthesia General Anesthesia   Time Out Verified patient identification, verified procedure, site/side was marked, verified correct patient position, special equipment/implants available, medications/allergies/relevant history reviewed, required imaging and test results available.   Sterile Technique Usual hand hygiene, masks, gowns, and gloves were used   Procedure Description Diagnostic bronchoscope advanced through endotracheal tube and into airway.  Airways were examined down to subsegmental level with findings noted below.  Following diagnostic evaluation, BAL performed in RLL with clear return.  The diagnostic bronchoscope was then removed and the EBUS bronchoscope was advanced into airway with station 7 biopsied and sent for slide, cell block, and/or culture.  The EBUS bronchoscope was removed after assuring no active bleeding from biopsy site.  Findings:  - Mild diffuse bronchial pitting - Extrinsic compression of RLL superior subsegment - Enlarged subcarinal node  Complications/Tolerance None; patient tolerated the procedure well. Chest X-ray is not needed post procedure.   EBL Minimal   Specimen(s) RLL BAL: culture Station 7 LN: slide and cell block

## 2019-09-07 ENCOUNTER — Telehealth: Payer: Self-pay | Admitting: Internal Medicine

## 2019-09-07 ENCOUNTER — Encounter: Payer: Self-pay | Admitting: *Deleted

## 2019-09-07 ENCOUNTER — Inpatient Hospital Stay (HOSPITAL_COMMUNITY): Payer: Medicare Other

## 2019-09-07 DIAGNOSIS — R918 Other nonspecific abnormal finding of lung field: Secondary | ICD-10-CM

## 2019-09-07 DIAGNOSIS — C3490 Malignant neoplasm of unspecified part of unspecified bronchus or lung: Secondary | ICD-10-CM

## 2019-09-07 LAB — BASIC METABOLIC PANEL
Anion gap: 10 (ref 5–15)
BUN: 10 mg/dL (ref 8–23)
CO2: 25 mmol/L (ref 22–32)
Calcium: 8.4 mg/dL — ABNORMAL LOW (ref 8.9–10.3)
Chloride: 101 mmol/L (ref 98–111)
Creatinine, Ser: 1.01 mg/dL (ref 0.61–1.24)
GFR calc Af Amer: >60 mL/min (ref >60)
GFR calc non Af Amer: >60 mL/min (ref >60)
Glucose, Bld: 111 mg/dL — ABNORMAL HIGH (ref 70–99)
Potassium: 4.3 mmol/L (ref 3.5–5.1)
Sodium: 136 mmol/L (ref 135–145)

## 2019-09-07 LAB — CBC
HCT: 33.3 % — ABNORMAL LOW (ref 39.0–52.0)
Hemoglobin: 10.5 g/dL — ABNORMAL LOW (ref 13.0–17.0)
MCH: 26.6 pg (ref 26.0–34.0)
MCHC: 31.5 g/dL (ref 30.0–36.0)
MCV: 84.5 fL (ref 80.0–100.0)
Platelets: 304 10*3/uL (ref 150–400)
RBC: 3.94 MIL/uL — ABNORMAL LOW (ref 4.22–5.81)
RDW: 14.8 % (ref 11.5–15.5)
WBC: 12.3 10*3/uL — ABNORMAL HIGH (ref 4.0–10.5)
nRBC: 0 % (ref 0.0–0.2)

## 2019-09-07 MED ORDER — METHYLPREDNISOLONE SODIUM SUCC 125 MG IJ SOLR
60.0000 mg | Freq: Once | INTRAMUSCULAR | Status: AC
  Administered 2019-09-07: 60 mg via INTRAVENOUS
  Filled 2019-09-07: qty 2

## 2019-09-07 MED ORDER — SODIUM CHLORIDE 0.9 % IV SOLN
3.0000 g | Freq: Four times a day (QID) | INTRAVENOUS | Status: DC
  Administered 2019-09-07 – 2019-09-09 (×9): 3 g via INTRAVENOUS
  Filled 2019-09-07: qty 3
  Filled 2019-09-07 (×11): qty 8

## 2019-09-07 NOTE — Telephone Encounter (Signed)
-----   Message from Valrie Hart, RN sent at 09/07/2019  4:48 PM EDT ----- Regarding: RE: Patient for MTOC Hi Dr. Tamala Julian It looks like the first available appt is next week to see Dr. Julien Nordmann. Would you be able to order PET and MRI brain?  Thanks Hinton Dyer ----- Message ----- From: Candee Furbish, MD Sent: 09/06/2019   2:53 PM EDT To: Valrie Hart, RN Subject: Patient for [place]                               Lung mass, biopsied today, thanks

## 2019-09-07 NOTE — Progress Notes (Signed)
PROGRESS NOTE        PATIENT DETAILS Name: Jerry Wong Age: 74 y.o. Sex: male Date of Birth: [birth year] Admit Date: 09/02/2019 Admitting Physician Norval Morton, MD YME:BRAXE, Leticia Penna, NP  Brief Narrative: Patient is a 74 y.o. male HTN, HLD, prostate cancer s/p prostatectomy/radiation/Eligard, remote history of tobacco use, recently diagnosed with right lower lobe lung mass on 6/30-who presented with fever, cough in spite of being on oral antimicrobial therapy prior to this hospital stay-found to have sepsis with acute hypoxic respiratory failure secondary to postobstructive pneumonia -and admitted to the hospitalist service.  See below for further details.  Significant events: 7/8>> admit to [facility] for sepsis/hypoxia from postobstructive pneumonia.  Recently diagnosed with lung mass.  Significant studies: 7/8>> CT chest: 5.1 x 3.9 cm right lung hilar mass, necrotic appearing mediastinal/hilar lymph nodes.  Indeterminate 2.3 cm left adrenal mass. 7/9>> chest x-ray: Stable right hilar and infrahilar mass lesion similar to that seen on prior CT.  Antimicrobial therapy: Vancomycin: 7/8>> 7/10 Flagyl: 7/8>> 7/11 Cefepime: 7/8>> 7/11 Unasyn: 7/11>> 7/12, 7/13>> Augmentin: 7/12>> 7/13  Microbiology data: 7/9: Blood culture>> no growth. 7/12: BAL culture>> no growth  Pathology data: 7/12>> bronchoscopy/biopsy: Pending  Procedures : 7/12>> bronchoscopy   Consults: PCCM  DVT Prophylaxis : enoxaparin (LOVENOX) injection 40 mg Start: 09/03/19 1400  Subjective: Continues to have pain in the plantar aspect of the left fifth toe.  Febrile to 101 F this morning.  Assessment/Plan: Sepsis with acute hypoxic respiratory failure secondary to postobstructive pneumonia: Sepsis physiology has resolved-had been transitioned to room air a day or so okay-requiring around 2 L of oxygen this morning.  Febrile to 101 this morning as well.  Underwent bronchoscopy on 7/12.   BAL cultures negative.  Had been transitioned to Augmentin yesterday-given fever-and 2 L of oxygen requirement-we will switch him back to IV Unasyn.  He otherwise appears stable-plan is to reassess tomorrow-if afebrile-suspect could be transition back to Augmentin and discharged home.  Assess for home O2 requirement prior to discharge-ambulatory O2 screen has been ordered.  AKI: Likely hemodynamically mediated-AKI has resolved-back to baseline.  Right lung mass-suspicion for bronchogenic carcinoma: PCCM consulted-s/p bronchoscopy and 7/12.  Biopsy pending-follow.  Discussed with Dr. Linna Hoff Smith-PCCM-on 7/12-once medically stable-okay to discharge-he will follow patient/biopsy results at pulmonology office.  Left great toe tenderness: Benign exam-he does have a history of gout-pain is somewhat similar to his prior gout flares-we will give 1 dose of IV Solu-Medrol-reassess on 7/14-if responsive to steroids-may need prednisone for a few days.  Hypokalemia: Repleted.  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  History of prostate cancer: Continue follow-up with urology at [facility]  Chronic pain/neuropathy: Continue Lyrica and oxycodone as needed.  Insomnia: Continue Ambien   Diet: Diet Order            Diet regular Room service appropriate? Yes; Fluid consistency: Thin  Diet effective now                  Code Status: Full code   Family Communication: Offered to call family-patient prefers to update himself.  Disposition Plan: Status is: Inpatient  Remains inpatient appropriate because:Inpatient level of care appropriate due to severity of illness  Dispo: The patient  is from: Home              Anticipated d/c is to: Home              Anticipated d/c date is: 7/14              Patient currently is not medically stable to  d/c.  Barriers to Discharge: Plans were to discharge today-but febrile-restarting IV antibiotics  Antimicrobial agents: Anti-infectives (From admission, onward)   Start     Dose/Rate Route Frequency Ordered Stop   09/07/19 1000  Ampicillin-Sulbactam (UNASYN) 3 g in sodium chloride 0.9 % 100 mL IVPB     Discontinue     3 g 200 mL/hr over 30 Minutes Intravenous Every 6 hours 09/07/19 0907     09/06/19 2200  amoxicillin-clavulanate (AUGMENTIN) 875-125 MG per tablet 1 tablet  Status:  Discontinued        1 tablet Oral Every 12 hours 09/06/19 1228 09/07/19 0839   09/05/19 1600  Ampicillin-Sulbactam (UNASYN) 3 g in sodium chloride 0.9 % 100 mL IVPB  Status:  Discontinued        3 g 200 mL/hr over 30 Minutes Intravenous Every 8 hours 09/05/19 1417 09/06/19 1228   09/04/19 1800  vancomycin (VANCOREADY) IVPB 1250 mg/250 mL  Status:  Discontinued        1,250 mg 166.7 mL/hr over 90 Minutes Intravenous Every 12 hours 09/04/19 1419 09/04/19 1458   09/04/19 1430  ceFEPIme (MAXIPIME) 2 g in sodium chloride 0.9 % 100 mL IVPB  Status:  Discontinued        2 g 200 mL/hr over 30 Minutes Intravenous Every 8 hours 09/04/19 1419 09/05/19 1349   09/03/19 1800  vancomycin (VANCOREADY) IVPB 750 mg/150 mL  Status:  Discontinued        750 mg 150 mL/hr over 60 Minutes Intravenous Every 12 hours 09/03/19 0752 09/04/19 1419   09/03/19 1600  ceFEPIme (MAXIPIME) 2 g in sodium chloride 0.9 % 100 mL IVPB  Status:  Discontinued        2 g 200 mL/hr over 30 Minutes Intravenous Every 12 hours 09/03/19 0752 09/04/19 1419   09/03/19 1400  metroNIDAZOLE (FLAGYL) IVPB 500 mg  Status:  Discontinued        500 mg 100 mL/hr over 60 Minutes Intravenous Every 8 hours 09/03/19 0728 09/05/19 1349   09/03/19 0245  ceFEPIme (MAXIPIME) 2 g in sodium chloride 0.9 % 100 mL IVPB        2 g 200 mL/hr over 30 Minutes Intravenous  Once 09/03/19 0233 09/03/19 0419   09/03/19 0245  metroNIDAZOLE (FLAGYL) IVPB 500 mg        500 mg 100  mL/hr over 60 Minutes Intravenous  Once 09/03/19 0233 09/03/19 0524   09/03/19 0245  vancomycin (VANCOCIN) IVPB 1000 mg/200 mL premix  Status:  Discontinued        1,000 mg 200 mL/hr over 60 Minutes Intravenous  Once 09/03/19 0233 09/03/19 0233   09/03/19 0245  vancomycin (VANCOREADY) IVPB 2000 mg/400 mL        2,000 mg 200 mL/hr over 120 Minutes Intravenous  Once 09/03/19 0234 09/03/19 0947       Time spent: 25- minutes-Greater than 50% of this time was spent in counseling, explanation of diagnosis, planning of further management, and coordination of care.  MEDICATIONS: Scheduled Meds: . amitriptyline  75 mg Oral QHS  . amLODipine  10 mg Oral Daily  . aspirin EC  81  mg Oral Daily  . atorvastatin  20 mg Oral QHS  . enoxaparin (LOVENOX) injection  40 mg Subcutaneous Q24H  . guaiFENesin  600 mg Oral BID  . mouth rinse  15 mL Mouth Rinse BID  . pregabalin  100 mg Oral BID   Continuous Infusions: . sodium chloride 10 mL/hr at 09/05/19 0619  . sodium chloride 10 mL/hr at 09/05/19 0618  . ampicillin-sulbactam (UNASYN) IV 3 g (09/07/19 1048)   PRN Meds:.sodium chloride, sodium chloride, acetaminophen, oxyCODONE-acetaminophen **AND** oxyCODONE, tiZANidine, zolpidem   PHYSICAL EXAM: Vital signs: Vitals:   09/07/19 0603 09/07/19 0800 09/07/19 0822 09/07/19 1329  BP:  118/64 118/64 113/63  Pulse: 100 77  87  Resp: _0 Temp: 100.1 F (37.8 C) 97.6 F (36.4 C)  99 F (37.2 C)  TempSrc: Oral Oral  Oral  SpO2: 92% 92%  91%  Weight:      Height:       Filed Weights   09/04/19 2011 09/06/19 0517 09/07/19 0527  Weight: 94.8 kg 96.9 kg 96.8 kg   Body mass index is 31.51 kg/m.   Gen Exam:Alert awake-not in any distress HEENT:atraumatic, normocephalic Chest: B/L clear to auscultation anteriorly CVS:S1S2 regular Abdomen:soft non tender, non distended Extremities:no edema Neurology: Non focal Skin: no rash  I have personally reviewed following labs and imaging  studies  LABORATORY DATA: CBC: Recent Labs  Lab 09/02/19 1002 09/02/19 1711 09/04/19 0741 09/05/19 0718 09/07/19 0505  WBC 16.9* 16.4* 11.6* 12.4* 12.3*  NEUTROABS 13.3* 11.9*  --   --   --   HGB 11.3* 11.5* 10.3* 10.7* 10.5*  HCT 34.4* 35.5* 32.8* 33.4* 33.3*  MCV 80.6 81.4 82.8 83.3 84.5  PLT 358.0 367 286 316 024    Basic Metabolic Panel: Recent Labs  Lab 09/02/19 1002 09/02/19 1711 09/04/19 0741 09/05/19 0718 09/07/19 0505  NA 132* 133* 139 138 136  K 3.8 3.7 2.9* 3.9 4.3  CL 95* 97* 102 105 101  CO2 28 21* _1 GLUCOSE 130* 131* 107* 108* 111*  BUN 29* 24* _2 CREATININE 1.52* 1.51* 1.03 1.01 1.01  CALCIUM 9.1 8.6* 8.4* 8.5* 8.4*  MG  --   --   --  1.7  --     GFR: Estimated Creatinine Clearance: 77 mL/min (by C-G formula based on SCr of 1.01 mg/dL).  Liver Function Tests: Recent Labs  Lab 09/02/19 1711 09/04/19 0741  AST 21 19  ALT 19 18  ALKPHOS 66 57  BILITOT 1.0 0.6  PROT 6.8 6.1*  ALBUMIN 3.1* 2.4*   No results for input(s): LIPASE, AMYLASE in the last 168 hours. No results for input(s): AMMONIA in the last 168 hours.  Coagulation Profile: No results for input(s): INR, PROTIME in the last 168 hours.  Cardiac Enzymes: No results for input(s): CKTOTAL, CKMB, CKMBINDEX, TROPONINI in the last 168 hours.  BNP (last 3 results) No results for input(s): PROBNP in the last 8760 hours.  Lipid Profile: No results for input(s): CHOL, HDL, LDLCALC, TRIG, CHOLHDL, LDLDIRECT in the last 72 hours.  Thyroid Function Tests: No results for input(s): TSH, T4TOTAL, FREET4, T3FREE, THYROIDAB in the last 72 hours.  Anemia Panel: No results for input(s): VITAMINB12, FOLATE, FERRITIN, TIBC, IRON, RETICCTPCT in the last 72 hours.  Urine analysis:    Component Value Date/Time   COLORURINE YELLOW 09/03/2019 0426   APPEARANCEUR CLEAR 09/03/2019 0426   LABSPEC 1.035 (H) 09/03/2019 0426   PHURINE 5.0 09/03/2019  0426   GLUCOSEU NEGATIVE 09/03/2019  0426   HGBUR MODERATE (A) 09/03/2019 0426   BILIRUBINUR NEGATIVE 09/03/2019 0426   KETONESUR NEGATIVE 09/03/2019 0426   PROTEINUR NEGATIVE 09/03/2019 0426   NITRITE NEGATIVE 09/03/2019 0426   LEUKOCYTESUR NEGATIVE 09/03/2019 0426    Sepsis Labs: Lactic Acid, Venous    Component Value Date/Time   LATICACIDVEN 1.2 09/03/2019 0906    MICROBIOLOGY: Recent Results (from the past 240 hour(s))  Culture, blood (Routine X 2) w Reflex to ID Panel     Status: None (Preliminary result)   Collection Time: 09/03/19  1:51 AM   Specimen: BLOOD RIGHT HAND  Result Value Ref Range Status   Specimen Description BLOOD RIGHT HAND  Final   Special Requests   Final    BOTTLES DRAWN AEROBIC AND ANAEROBIC Blood Culture adequate volume   Culture   Final    NO GROWTH 4 DAYS Performed at [facility], [place] [address] [place], [place] [address]    Report Status PENDING  Incomplete  Culture, blood (Routine X 2) w Reflex to ID Panel     Status: None (Preliminary result)   Collection Time: 09/03/19  2:37 AM   Specimen: BLOOD  Result Value Ref Range Status   Specimen Description BLOOD LEFT ANTECUBITAL  Final   Special Requests   Final    BOTTLES DRAWN AEROBIC AND ANAEROBIC Blood Culture results may not be optimal due to an inadequate volume of blood received in culture bottles   Culture   Final    NO GROWTH 4 DAYS Performed at [facility], [place] [address] [place], [place] [address]    Report Status PENDING  Incomplete  SARS Coronavirus 2 by RT PCR (hospital order, performed in [facility] hospital lab) Nasopharyngeal Nasopharyngeal Swab     Status: None   Collection Time: 09/03/19  4:13 AM   Specimen: Nasopharyngeal Swab  Result Value Ref Range Status   SARS Coronavirus 2 NEGATIVE NEGATIVE Final    Comment: (NOTE) SARS-CoV-2 target nucleic acids are NOT DETECTED.  The SARS-CoV-2 RNA is generally detectable in upper and lower respiratory specimens during the acute phase of infection. The  lowest concentration of SARS-CoV-2 viral copies this assay can detect is 250 copies / mL. A negative result does not preclude SARS-CoV-2 infection and should not be used as the sole basis for treatment or other patient management decisions.  A negative result may occur with improper specimen collection / handling, submission of specimen other than nasopharyngeal swab, presence of viral mutation(s) within the areas targeted by this assay, and inadequate number of viral copies (<250 copies / mL). A negative result must be combined with clinical observations, patient history, and epidemiological information.  Fact Sheet for Patients:   StrictlyIdeas.no  Fact Sheet for Healthcare Providers: BankingDealers.co.za  This test is not yet approved or  cleared by the [place] FDA and has been authorized for detection and/or diagnosis of SARS-CoV-2 by FDA under an Emergency Use Authorization (EUA).  This EUA will remain in effect (meaning this test can be used) for the duration of the COVID-19 declaration under Section 564(b)(1) of the Act, 21 U.S.C. section 360bbb-3(b)(1), unless the authorization is terminated or revoked sooner.  Performed at [facility], [place] [address], [place], [place] [address]   Culture, respiratory     Status: None (Preliminary result)   Collection Time: 09/06/19 11:39 AM   Specimen: Bronchial Alveolar Lavage; Respiratory  Result Value Ref Range Status  Specimen Description BRONCHIAL ALVEOLAR LAVAGE  Final   Special Requests NONE  Final   Gram Stain NO WBC SEEN NO ORGANISMS SEEN   Final   Culture   Final    NO GROWTH < 24 HOURS Performed at [facility], [place] [address], [place], [place] [address]    Report Status PENDING  Incomplete    RADIOLOGY STUDIES/RESULTS: DG Chest Port 1 View  Result Date: 09/07/2019 CLINICAL DATA:  Shortness of breath. EXAM: PORTABLE CHEST 1 VIEW COMPARISON:  09/03/2019  FINDINGS: The cardiac silhouette is normal in size. Right hilar enlargement and infrahilar density are unchanged and correspond to lymphadenopathy and an infrahilar mass on CT with unchanged airspace opacity more laterally in the right lower lobe. The left lung remains clear. No pleural effusion or pneumothorax is identified. Prior anterior cervical fusion is noted. IMPRESSION: Unchanged right hilar/infrahilar mass and lower lobe airspace opacity. Electronically Signed   By: Logan Bores M.D.   On: 09/07/2019 08:47     LOS: 4 days   Oren Binet, MD  [facility]    To contact the attending provider between 7A-7P or the covering provider during after hours 7P-7A, please log into the web site [email or web address] and access using universal [place] password for that web site. If you do not have the password, please call the hospital operator.  09/07/2019, 1:47 PM

## 2019-09-07 NOTE — Progress Notes (Signed)
Pharmacy Antibiotic Note  Jerry Wong is a 74 y.o. male admitted on 09/02/2019 with sepsis due to post-obstructive pneumonia.  Pharmacy has been consulted for Unasyn dosing.  Plan: Initiate Unasyn 3 g q6h  Monitor clinical course, descalation, LOT Monitor renal function and adjust dose accordingly  Height: 5\' 9"  (175.3 cm) Weight: 96.8 kg (213 lb 6.5 oz) IBW/kg (Calculated) : 70.7  Temp (24hrs), Avg:99.3 F (37.4 C), Min:97.6 F (36.4 C), Max:101.6 F (38.7 C)  Recent Labs  Lab 09/02/19 1002 09/02/19 1711 09/03/19 0236 09/03/19 0624 09/03/19 0906 09/04/19 0741 09/05/19 0718 09/07/19 0505  WBC 16.9* 16.4*  --   --   --  11.6* 12.4* 12.3*  CREATININE 1.52* 1.51*  --   --   --  1.03 1.01 1.01  LATICACIDVEN  --   --  2.0* 1.6 1.2  --   --   --     Estimated Creatinine Clearance: 77 mL/min (by C-G formula based on SCr of 1.01 mg/dL).    No Known Allergies  Antimicrobials this admission: Vanc 7/9>> 7/10 Cefepime 7/9>>7/11 Flagyl 7/9>>7/11 Unasyn 7/11>> 7/12, 7/13>> Augmentin 7/12>>7/13  Microbiology results: 7/9 COVID neg 7/9 Bcx: ng x1 day 7/12 sputum neg <1 day  Thank you for allowing pharmacy to be a part of this patient's care.  Shauna Hugh, PharmD, [place]  PGY-1 Pharmacy Resident Office: [phone] 09/07/2019 9:05 AM  Please check [email or web address] for unit-specific pharmacy phone numbers.

## 2019-09-07 NOTE — Plan of Care (Signed)

## 2019-09-07 NOTE — Progress Notes (Signed)
I received referral on Mr. Weckerly today.  I updated new patient coordinator to call and schedule him to be seen on 09/14/19 with Dr. Julien Nordmann.

## 2019-09-08 ENCOUNTER — Encounter (HOSPITAL_COMMUNITY): Payer: Self-pay | Admitting: Internal Medicine

## 2019-09-08 ENCOUNTER — Telehealth: Payer: Self-pay | Admitting: Internal Medicine

## 2019-09-08 LAB — BASIC METABOLIC PANEL
Anion gap: 12 (ref 5–15)
BUN: 22 mg/dL (ref 8–23)
CO2: 23 mmol/L (ref 22–32)
Calcium: 8.7 mg/dL — ABNORMAL LOW (ref 8.9–10.3)
Chloride: 104 mmol/L (ref 98–111)
Creatinine, Ser: 0.93 mg/dL (ref 0.61–1.24)
GFR calc Af Amer: >60 mL/min (ref >60)
GFR calc non Af Amer: >60 mL/min (ref >60)
Glucose, Bld: 130 mg/dL — ABNORMAL HIGH (ref 70–99)
Potassium: 3.9 mmol/L (ref 3.5–5.1)
Sodium: 139 mmol/L (ref 135–145)

## 2019-09-08 LAB — CULTURE, BLOOD (ROUTINE X 2)
Culture: NO GROWTH
Culture: NO GROWTH
Special Requests: ADEQUATE

## 2019-09-08 LAB — CBC
HCT: 33.2 % — ABNORMAL LOW (ref 39.0–52.0)
Hemoglobin: 10.5 g/dL — ABNORMAL LOW (ref 13.0–17.0)
MCH: 26.3 pg (ref 26.0–34.0)
MCHC: 31.6 g/dL (ref 30.0–36.0)
MCV: 83.2 fL (ref 80.0–100.0)
Platelets: 349 10*3/uL (ref 150–400)
RBC: 3.99 MIL/uL — ABNORMAL LOW (ref 4.22–5.81)
RDW: 14.6 % (ref 11.5–15.5)
WBC: 17.5 10*3/uL — ABNORMAL HIGH (ref 4.0–10.5)
nRBC: 0 % (ref 0.0–0.2)

## 2019-09-08 LAB — CULTURE, RESPIRATORY W GRAM STAIN
Culture: NO GROWTH
Gram Stain: NONE SEEN

## 2019-09-08 NOTE — Telephone Encounter (Signed)
Received a new pt referral from Dr. Ina Homes for dx of lung cancer. Mr. Vanaman has been scheduled to see Dr. Julien Nordmann on 7/20 at 4pm w/labs at 330pm. I cld and provided the appt date and time to the pt's wife. She's been made aware for Mr. Amano to arrive 15 minutes early.

## 2019-09-08 NOTE — Progress Notes (Signed)
Progress Note    Jerry Wong  OZD:[id number] DOB: Feb 15, 1946  DOA: 09/02/2019 PCP: Pleas Koch, NP      Brief Narrative:    Medical records reviewed and are as summarized below:  Jerry Wong is a 74 y.o. male with medical history significant for HTN, HLD, prostate cancer s/p prostatectomy/radiation/Eligard, remote history of tobacco use, recently diagnosed with right lower lobe lung mass on 6/30-who presented with fever, cough in spite of being on oral antimicrobial therapy prior to this hospital stay-found to have sepsis with acute hypoxic respiratory failure secondary to postobstructive pneumonia -and admitted to the hospitalist service.  He was treated with empiric IV antibiotics and oxygen via nasal cannula.  He also complained of pain in the left fifth toe, likely from acute gout flare.  This responded to steroids.   Significant events: 7/8>> admit to [facility] for sepsis/hypoxia from postobstructive pneumonia.  Recently diagnosed with lung mass.  Significant studies: 7/8>> CT chest: 5.1 x 3.9 cm right lung hilar mass, necrotic appearing mediastinal/hilar lymph nodes.  Indeterminate 2.3 cm left adrenal mass. 7/9>> chest x-ray: Stable right hilar and infrahilar mass lesion similar to that seen on prior CT.  Antimicrobial therapy: Vancomycin: 7/8>> 7/10 Flagyl: 7/8>> 7/11 Cefepime: 7/8>> 7/11 Unasyn: 7/11>> 7/12, 7/13>> Augmentin: 7/12>> 7/13  Microbiology data: 7/9: Blood culture>> no growth. 7/12: BAL culture>> no growth  Pathology data: 7/12>> bronchoscopy/biopsy: Pending  Procedures : 7/12>> bronchoscopy   Consults: PCCM   Assessment/Plan:   Principal Problem:   Sepsis ([place]) Active Problems:   Chronic neck and back pain   Essential hypertension   Hyperlipidemia   Prostate cancer ([place])   Acute kidney injury superimposed on chronic kidney disease ([place])   Acute respiratory failure with hypoxia ([facility])  Sepsis with acute hypoxic respiratory failure  secondary to postobstructive pneumonia, worsening leukocytosis: Sepsis physiology has resolved.  Continue empiric IV antibiotics.  Underwent bronchoscopy on 7/12.  BAL cultures negative.   AKI: Likely hemodynamically mediated-AKI has resolved-back to baseline.  Right lung mass-suspicion for bronchogenic carcinoma: PCCM consulted-s/p bronchoscopy and 7/12.  Biopsy pending-follow.  Outpatient follow-up with oncologist.  Left great toe tenderness:  Improved with steroids.  Hypokalemia: Repleted.  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  History of prostate cancer: Continue follow-up with urology at [facility]  Chronic pain/neuropathy: Continue Lyrica and oxycodone as needed.  Insomnia: Continue Ambien   Body mass index is 31.51 kg/m.  (Obesity)  Diet Order            Diet regular Room service appropriate? Yes; Fluid consistency: Thin  Diet effective now                       Medications:   . amitriptyline  75 mg Oral QHS  . amLODipine  10 mg Oral Daily  . aspirin EC  81 mg Oral Daily  . atorvastatin  20 mg Oral QHS  . enoxaparin (LOVENOX) injection  40 mg Subcutaneous Q24H  . guaiFENesin  600 mg Oral BID  . mouth rinse  15 mL Mouth Rinse BID  . pregabalin  100 mg Oral BID   Continuous Infusions: . sodium chloride 10 mL/hr at 09/05/19 0619  . sodium chloride 10 mL/hr at 09/05/19 0618  . ampicillin-sulbactam (UNASYN) IV 3 g (09/08/19 1602)     Anti-infectives (From admission, onward)  Start     Dose/Rate Route Frequency Ordered Stop   09/07/19 1000  Ampicillin-Sulbactam (UNASYN) 3 g in sodium chloride 0.9 % 100 mL IVPB     Discontinue     3 g 200 mL/hr over 30 Minutes Intravenous Every 6 hours 09/07/19 0907     09/06/19 2200  amoxicillin-clavulanate (AUGMENTIN) 875-125 MG per tablet 1 tablet  Status:   Discontinued        1 tablet Oral Every 12 hours 09/06/19 1228 09/07/19 0839   09/05/19 1600  Ampicillin-Sulbactam (UNASYN) 3 g in sodium chloride 0.9 % 100 mL IVPB  Status:  Discontinued        3 g 200 mL/hr over 30 Minutes Intravenous Every 8 hours 09/05/19 1417 09/06/19 1228   09/04/19 1800  vancomycin (VANCOREADY) IVPB 1250 mg/250 mL  Status:  Discontinued        1,250 mg 166.7 mL/hr over 90 Minutes Intravenous Every 12 hours 09/04/19 1419 09/04/19 1458   09/04/19 1430  ceFEPIme (MAXIPIME) 2 g in sodium chloride 0.9 % 100 mL IVPB  Status:  Discontinued        2 g 200 mL/hr over 30 Minutes Intravenous Every 8 hours 09/04/19 1419 09/05/19 1349   09/03/19 1800  vancomycin (VANCOREADY) IVPB 750 mg/150 mL  Status:  Discontinued        750 mg 150 mL/hr over 60 Minutes Intravenous Every 12 hours 09/03/19 0752 09/04/19 1419   09/03/19 1600  ceFEPIme (MAXIPIME) 2 g in sodium chloride 0.9 % 100 mL IVPB  Status:  Discontinued        2 g 200 mL/hr over 30 Minutes Intravenous Every 12 hours 09/03/19 0752 09/04/19 1419   09/03/19 1400  metroNIDAZOLE (FLAGYL) IVPB 500 mg  Status:  Discontinued        500 mg 100 mL/hr over 60 Minutes Intravenous Every 8 hours 09/03/19 0728 09/05/19 1349   09/03/19 0245  ceFEPIme (MAXIPIME) 2 g in sodium chloride 0.9 % 100 mL IVPB        2 g 200 mL/hr over 30 Minutes Intravenous  Once 09/03/19 0233 09/03/19 0419   09/03/19 0245  metroNIDAZOLE (FLAGYL) IVPB 500 mg        500 mg 100 mL/hr over 60 Minutes Intravenous  Once 09/03/19 0233 09/03/19 0524   09/03/19 0245  vancomycin (VANCOCIN) IVPB 1000 mg/200 mL premix  Status:  Discontinued        1,000 mg 200 mL/hr over 60 Minutes Intravenous  Once 09/03/19 0233 09/03/19 0233   09/03/19 0245  vancomycin (VANCOREADY) IVPB 2000 mg/400 mL        2,000 mg 200 mL/hr over 120 Minutes Intravenous  Once 09/03/19 0234 09/03/19 0947             Family Communication/Anticipated D/C date and plan/Code Status   DVT  prophylaxis: enoxaparin (LOVENOX) injection 40 mg Start: 09/03/19 1400     Code Status: Full Code  Family Communication: Plan discussed with patient Disposition Plan:    Status is: Inpatient  Remains inpatient appropriate because:IV treatments appropriate due to intensity of illness or inability to take PO   Dispo: The patient is from: Home              Anticipated d/c is to: Home              Anticipated d/c date is: 1 day              Patient currently is not medically stable to  d/c.           Subjective:   No cough, wheezing, shortness of breath or chest pain.  He has mild pain in the left foot.  Objective:    Vitals:   09/08/19 0438 09/08/19 0800 09/08/19 1457 09/08/19 1500  BP: (!) 121/57 (!) 107/54 (!) 100/54 (!) 100/54  Pulse: 89 64 88 88  Resp: _0 Temp: 98.4 F (36.9 C)  98.5 F (36.9 C)   TempSrc: Oral  Oral   SpO2: 94% 93% 93% 90%  Weight:      Height:       No data found.   Intake/Output Summary (Last 24 hours) at 09/08/2019 1654 Last data filed at 09/08/2019 1100 Gross per 24 hour  Intake 460 ml  Output 450 ml  Net 10 ml   Filed Weights   09/04/19 2011 09/06/19 0517 09/07/19 0527  Weight: 94.8 kg 96.9 kg 96.8 kg    Exam:  GEN: NAD SKIN: No rash EYES: EOMI ENT: MMM CV: RRR PULM: CTA B ABD: soft, ND, NT, +BS CNS: AAO x 3, non focal EXT: No leg edema or tenderness.  He has mild tenderness of the left fifth toe but no erythema or swelling.   Data Reviewed:   I have personally reviewed following labs and imaging studies:  Labs: Labs show the following:   Basic Metabolic Panel: Recent Labs  Lab 09/02/19 1711 09/02/19 1711 09/04/19 0741 09/04/19 0741 09/05/19 0718 09/05/19 0718 09/07/19 0505 09/08/19 0925  NA 133*  --  139  --  138  --  136 139  K 3.7   < > 2.9*   < > 3.9   < > 4.3 3.9  CL 97*  --  102  --  105  --  101 104  CO2 21*  --  25  --  24  --  25 23  GLUCOSE 131*  --  107*  --  108*  --  111* 130*   BUN 24*  --  15  --  14  --  10 22  CREATININE 1.51*  --  1.03  --  1.01  --  1.01 0.93  CALCIUM 8.6*  --  8.4*  --  8.5*  --  8.4* 8.7*  MG  --   --   --   --  1.7  --   --   --    < > = values in this interval not displayed.   GFR Estimated Creatinine Clearance: 83.6 mL/min (by C-G formula based on SCr of 0.93 mg/dL). Liver Function Tests: Recent Labs  Lab 09/02/19 1711 09/04/19 0741  AST 21 19  ALT 19 18  ALKPHOS 66 57  BILITOT 1.0 0.6  PROT 6.8 6.1*  ALBUMIN 3.1* 2.4*   No results for input(s): LIPASE, AMYLASE in the last 168 hours. No results for input(s): AMMONIA in the last 168 hours. Coagulation profile No results for input(s): INR, PROTIME in the last 168 hours.  CBC: Recent Labs  Lab 09/02/19 1002 09/02/19 1002 09/02/19 1711 09/04/19 0741 09/05/19 0718 09/07/19 0505 09/08/19 0925  WBC 16.9*   < > 16.4* 11.6* 12.4* 12.3* 17.5*  NEUTROABS 13.3*  --  11.9*  --   --   --   --   HGB 11.3*   < > 11.5* 10.3* 10.7* 10.5* 10.5*  HCT 34.4*   < > 35.5* 32.8* 33.4* 33.3* 33.2*  MCV 80.6   < > 81.4 82.8 83.3  84.5 83.2  PLT 358.0   < > 367 286 316 304 349   < > = values in this interval not displayed.   Cardiac Enzymes: No results for input(s): CKTOTAL, CKMB, CKMBINDEX, TROPONINI in the last 168 hours. BNP (last 3 results) No results for input(s): PROBNP in the last 8760 hours. CBG: No results for input(s): GLUCAP in the last 168 hours. D-Dimer: No results for input(s): DDIMER in the last 72 hours. Hgb A1c: No results for input(s): HGBA1C in the last 72 hours. Lipid Profile: No results for input(s): CHOL, HDL, LDLCALC, TRIG, CHOLHDL, LDLDIRECT in the last 72 hours. Thyroid function studies: No results for input(s): TSH, T4TOTAL, T3FREE, THYROIDAB in the last 72 hours.  Invalid input(s): FREET3 Anemia work up: No results for input(s): VITAMINB12, FOLATE, FERRITIN, TIBC, IRON, RETICCTPCT in the last 72 hours. Sepsis Labs: Recent Labs  Lab 09/02/19 1711  09/03/19 0236 09/03/19 7035 09/03/19 0906 09/04/19 0741 09/05/19 0718 09/07/19 0505 09/08/19 0925  PROCALCITON  --   --   --   --  0.24  --   --   --   WBC   < >  --   --   --  11.6* 12.4* 12.3* 17.5*  LATICACIDVEN  --  2.0* 1.6 1.2  --   --   --   --    < > = values in this interval not displayed.    Microbiology Recent Results (from the past 240 hour(s))  Culture, blood (Routine X 2) w Reflex to ID Panel     Status: None   Collection Time: 09/03/19  1:51 AM   Specimen: BLOOD RIGHT HAND  Result Value Ref Range Status   Specimen Description BLOOD RIGHT HAND  Final   Special Requests   Final    BOTTLES DRAWN AEROBIC AND ANAEROBIC Blood Culture adequate volume   Culture   Final    NO GROWTH 5 DAYS Performed at [facility], [place] [address] [place], [place] [address]    Report Status 09/08/2019 FINAL  Final  Culture, blood (Routine X 2) w Reflex to ID Panel     Status: None   Collection Time: 09/03/19  2:37 AM   Specimen: BLOOD  Result Value Ref Range Status   Specimen Description BLOOD LEFT ANTECUBITAL  Final   Special Requests   Final    BOTTLES DRAWN AEROBIC AND ANAEROBIC Blood Culture results may not be optimal due to an inadequate volume of blood received in culture bottles   Culture   Final    NO GROWTH 5 DAYS Performed at [facility], [place] [address], [place], [place] [address]    Report Status 09/08/2019 FINAL  Final  SARS Coronavirus 2 by RT PCR (hospital order, performed in [facility] hospital lab) Nasopharyngeal Nasopharyngeal Swab     Status: None   Collection Time: 09/03/19  4:13 AM   Specimen: Nasopharyngeal Swab  Result Value Ref Range Status   SARS Coronavirus 2 NEGATIVE NEGATIVE Final    Comment: (NOTE) SARS-CoV-2 target nucleic acids are NOT DETECTED.  The SARS-CoV-2 RNA is generally detectable in upper and lower respiratory specimens during the acute phase of infection. The lowest concentration of SARS-CoV-2 viral copies this assay can  detect is 250 copies / mL. A negative result does not preclude SARS-CoV-2 infection and should not be used as the sole basis for treatment or other patient management decisions.  A negative result may occur with improper specimen collection / handling,  submission of specimen other than nasopharyngeal swab, presence of viral mutation(s) within the areas targeted by this assay, and inadequate number of viral copies (<250 copies / mL). A negative result must be combined with clinical observations, patient history, and epidemiological information.  Fact Sheet for Patients:   StrictlyIdeas.no  Fact Sheet for Healthcare Providers: BankingDealers.co.za  This test is not yet approved or  cleared by the [place] FDA and has been authorized for detection and/or diagnosis of SARS-CoV-2 by FDA under an Emergency Use Authorization (EUA).  This EUA will remain in effect (meaning this test can be used) for the duration of the COVID-19 declaration under Section 564(b)(1) of the Act, 21 U.S.C. section 360bbb-3(b)(1), unless the authorization is terminated or revoked sooner.  Performed at [facility], [place] [address], [place], [place] [address]   Culture, respiratory     Status: None   Collection Time: 09/06/19 11:39 AM   Specimen: Bronchial Alveolar Lavage; Respiratory  Result Value Ref Range Status   Specimen Description BRONCHIAL ALVEOLAR LAVAGE  Final   Special Requests NONE  Final   Gram Stain NO WBC SEEN NO ORGANISMS SEEN   Final   Culture   Final    NO GROWTH 2 DAYS Performed at [facility], [address], [place], [place] [address]    Report Status 09/08/2019 FINAL  Final    Procedures and diagnostic studies:  DG Chest Port 1 View  Result Date: 09/07/2019 CLINICAL DATA:  Shortness of breath. EXAM: PORTABLE CHEST 1 VIEW COMPARISON:  09/03/2019 FINDINGS: The cardiac silhouette is normal in size. Right hilar  enlargement and infrahilar density are unchanged and correspond to lymphadenopathy and an infrahilar mass on CT with unchanged airspace opacity more laterally in the right lower lobe. The left lung remains clear. No pleural effusion or pneumothorax is identified. Prior anterior cervical fusion is noted. IMPRESSION: Unchanged right hilar/infrahilar mass and lower lobe airspace opacity. Electronically Signed   By: Logan Bores M.D.   On: 09/07/2019 08:47               LOS: 5 days   Auriana Scalia  [facility]     09/08/2019, 4:54 PM

## 2019-09-09 ENCOUNTER — Inpatient Hospital Stay (HOSPITAL_COMMUNITY): Payer: Medicare Other

## 2019-09-09 DIAGNOSIS — J189 Pneumonia, unspecified organism: Secondary | ICD-10-CM | POA: Diagnosis present

## 2019-09-09 DIAGNOSIS — J9621 Acute and chronic respiratory failure with hypoxia: Secondary | ICD-10-CM

## 2019-09-09 LAB — CBC WITH DIFFERENTIAL/PLATELET
Abs Immature Granulocytes: 0.16 10*3/uL — ABNORMAL HIGH (ref 0.00–0.07)
Basophils Absolute: 0 10*3/uL (ref 0.0–0.1)
Basophils Relative: 0 %
Eosinophils Absolute: 0.1 10*3/uL (ref 0.0–0.5)
Eosinophils Relative: 1 %
HCT: 31.7 % — ABNORMAL LOW (ref 39.0–52.0)
Hemoglobin: 10 g/dL — ABNORMAL LOW (ref 13.0–17.0)
Immature Granulocytes: 1 %
Lymphocytes Relative: 11 %
Lymphs Abs: 1.7 10*3/uL (ref 0.7–4.0)
MCH: 26.8 pg (ref 26.0–34.0)
MCHC: 31.5 g/dL (ref 30.0–36.0)
MCV: 85 fL (ref 80.0–100.0)
Monocytes Absolute: 1.2 10*3/uL — ABNORMAL HIGH (ref 0.1–1.0)
Monocytes Relative: 8 %
Neutro Abs: 12.7 10*3/uL — ABNORMAL HIGH (ref 1.7–7.7)
Neutrophils Relative %: 79 %
Platelets: 396 10*3/uL (ref 150–400)
RBC: 3.73 MIL/uL — ABNORMAL LOW (ref 4.22–5.81)
RDW: 14.9 % (ref 11.5–15.5)
WBC: 15.9 10*3/uL — ABNORMAL HIGH (ref 4.0–10.5)
nRBC: 0 % (ref 0.0–0.2)

## 2019-09-09 LAB — URIC ACID: Uric Acid, Serum: 5.3 mg/dL (ref 3.7–8.6)

## 2019-09-09 LAB — CYTOLOGY - NON PAP

## 2019-09-09 MED ORDER — FUROSEMIDE 10 MG/ML IJ SOLN
40.0000 mg | Freq: Once | INTRAMUSCULAR | Status: AC
  Administered 2019-09-09: 40 mg via INTRAVENOUS
  Filled 2019-09-09: qty 4

## 2019-09-09 MED ORDER — IBUPROFEN 600 MG PO TABS
600.0000 mg | ORAL_TABLET | Freq: Three times a day (TID) | ORAL | 0 refills | Status: AC
Start: 2019-09-09 — End: 2019-09-12

## 2019-09-09 MED ORDER — GADOBUTROL 1 MMOL/ML IV SOLN
9.5000 mL | Freq: Once | INTRAVENOUS | Status: AC | PRN
  Administered 2019-09-09: 9.5 mL via INTRAVENOUS

## 2019-09-09 MED ORDER — COLCHICINE 0.6 MG PO TABS
1.2000 mg | ORAL_TABLET | Freq: Once | ORAL | Status: AC
  Administered 2019-09-09: 1.2 mg via ORAL
  Filled 2019-09-09: qty 2

## 2019-09-09 NOTE — Progress Notes (Addendum)
SATURATION QUALIFICATIONS: (This note is used to comply with regulatory documentation for home oxygen)  Patient Saturations on Room Air at Rest = 91%  Patient Saturations on Room Air while Ambulating = 87%  Patient Saturations on 2Liters of oxygen while Ambulating = 90%  Please briefly explain why patient needs home oxygen:  Patient desaturates during ambulation, activity.

## 2019-09-09 NOTE — Progress Notes (Signed)
09/09/2019 Seen in f/u for lung mass  S: No events, on RA at rest, just walked but does not know if he desaturated. His left foot pain is a bit better today but still present. No fevers, no hemoptysis.  O: No acute distress Lungs clear Has 1+edema No focal neurological deficits  A: Advanced poorly differentiated cancer, stains for all cells of origin were negative.  Based on available imaging this is probably lung.  Probable postobstructive pneumonia  Probable gout  P: - Onc appt with Dr. Julien Nordmann at 7/20 4PM - MRI brain, can be inpatient or outpatient - Has completed 7 days abx therapy, I think he does not need further antibiotics - Giving 1 dose of lasix - O2 eval per primary - He can go home at primary's discretion - Please reach out if any questions or concerns  Erskine Emery MD PCCM

## 2019-09-09 NOTE — Care Management Important Message (Signed)
Important Message  Patient Details  Name: Jerry Wong MRN: [id number] Date of Birth: 09/21/1945   Medicare Important Message Given:  Yes - Important Message mailed due to current National Emergency  Verbal consent obtained due to current National Emergency  Relationship to patient: Self Contact Name: [name] Call Date: 09/09/19  Time: 0601 Phone: [phone] Outcome: No Answer/Busy Important Message mailed to: Patient address on file    [name] 09/09/2019, 2:44 PM

## 2019-09-09 NOTE — Discharge Summary (Addendum)
Physician Discharge Summary  Jerry Wong ZYS:[id number] DOB: 12-07-1945 DOA: 09/02/2019  PCP: Pleas Koch, NP  Admit date: 09/02/2019 Discharge date: 09/09/2019  Discharge disposition: Home   Recommendations for Outpatient Follow-Up:   Follow-up with oncologist, Dr. Earlie Server on 09/14/2019 Follow-up with pulmonologist as scheduled   Discharge Diagnosis:   Principal Problem:   Sepsis [facility]) Active Problems:   Chronic neck and back pain   Essential hypertension   Hyperlipidemia   Prostate cancer ([place])   Acute kidney injury superimposed on chronic kidney disease ([place])   Acute on chronic respiratory failure with hypoxia ([place])   Postobstructive pneumonia    Discharge Condition: Stable.  Diet recommendation:  Diet Order            Diet - low sodium heart healthy                       Code Status: Full Code     Hospital Course:   [name] is a 74 y.o. male with medical history significant for HTN, HLD, prostate cancer s/p prostatectomy/radiation/Eligard, remote history of tobacco use, recently diagnosed with right lower lobe lung mass on 6/30. He presented with fever, cough in spite of being on oral antimicrobial therapy (Levaquin) prior to this hospital stay.  He was found to have sepsis with acute hypoxic respiratory failure secondary to postobstructive pneumonia -and admitted to the hospitalist service.  He was treated with empiric IV antibiotics and oxygen via nasal cannula.  Unfortunately, he could not be weaned off of oxygen.  At rest, oxygen saturation on room air was 93%.  However, with ambulation, oxygen saturation was 88% on room air.  Home oxygen has been ordered for chronic hypoxemic respiratory failure.  MRI brain did not show any evidence of intracranial metastasis.  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  Findings were discussed with the patient.   He also complained of pain in the left fifth toe, likely from acute gout flare.   He said he has a history of gout but he does not have attacks very often.  He said his last attack prior to this recent episode was about 5 years ago.   He was treated with steroids and colchicine.  Pain has improved.  He has been advised to discontinue hydrochlorothiazide because of recent gout flare.  Uric acid level was normal.  Moreover, his blood pressure has been on the low side.  Follow-up with PCP for further management as recommended.  His condition has improved and he is deemed stable for discharge to home today.    Medical Consultants:    Pulmonologist   Discharge Exam:   Vitals:   09/08/19 2000 09/09/19 0438  BP: 127/63 127/73  Pulse: 89 (!) 105  Resp: 14 19  Temp: 99.1 F (37.3 C) 99.2 F (37.3 C)  SpO2: 93% 93%   Vitals:   09/08/19 1457 09/08/19 1500 09/08/19 2000 09/09/19 0438  BP: (!) 100/54 (!) 100/54 127/63 127/73  Pulse: 88 88 89 (!) 105  Resp: 12 14 14 19   Temp: 98.5 F (36.9 C)  99.1 F (37.3 C) 99.2 F (37.3 C)  TempSrc: Oral  Oral Oral  SpO2: 93% 90% 93% 93%  Weight:      Height:         GEN: NAD SKIN: No rash EYES: EOMI ENT: MMM CV: RRR PULM: CTA B ABD: soft, ND, NT, +BS CNS: AAO x 3, non focal EXT: No bilateral  leg edema or tenderness.  Left fifth toe is slightly tender but there is no erythema or redness.   The results of significant diagnostics from this hospitalization (including imaging, microbiology, ancillary and laboratory) are listed below for reference.     Procedures and Diagnostic Studies:   CT Chest W Contrast  Result Date: 09/02/2019 CLINICAL DATA:  Daily fevers. Shortness of breath x3 weeks. Abnormal chest x-ray. EXAM: CT CHEST WITH CONTRAST TECHNIQUE: Multidetector CT imaging of the chest was performed during intravenous contrast administration. CONTRAST:  54mL OMNIPAQUE IOHEXOL 300 MG/ML  SOLN COMPARISON:  Chest x-ray dated August 25, 2019 FINDINGS: Cardiovascular: There are atherosclerotic changes of the thoracic aorta  without evidence for dissection or aneurysm. The main pulmonary artery is not significantly dilated. There is no large centrally located pulmonary embolism. Detection of smaller pulmonary emboli is severely limited by contrast bolus timing. There are coronary artery calcifications. There is a trace pericardial effusion. The heart size is normal. Mediastinum/Nodes: --there are enlarged mediastinal lymph nodes. For example there is a necrotic appearing subcarinal lymph node measuring 2.3 cm (axial series 2, image 83). Additional enlarged mediastinal lymph nodes are noted. --multiple pathologically enlarged, necrotic right hilar lymph nodes are noted. -- No axillary lymphadenopathy. -- No supraclavicular lymphadenopathy. -- Normal thyroid gland where visualized. -  Unremarkable esophagus. Lungs/Pleura: There is an ill-defined right hilar lung mass measuring approximately 5.1 x 3.9 cm (axial series 2, image 96). There may be postobstructive pneumonia in the right lower lobe. There is interlobular septal thickening involving the right lower lobe. There is atelectasis at the left lung base. Upper Abdomen: There is a left adrenal mass measuring approximately 2.3 cm (axial series 2, image 160). Multiple renal cysts are noted bilaterally. Musculoskeletal: No chest wall abnormality. No bony spinal canal stenosis. IMPRESSION: 1. Ill-defined right hilar lung mass measuring approximately 5.1 x 3.9 cm with multiple pathologically enlarged, necrotic appearing mediastinal and right hilar lymph nodes. 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 2. Indeterminate 2.3 cm left adrenal mass. While this may represent a benign adenoma, a metastatic lesion is not excluded. 3. Trace pericardial effusion. Aortic Atherosclerosis (ICD10-I70.0). Electronically Signed   By: Constance Holster M.D.   On: 09/02/2019 16:07   DG Chest Port 1 View  Result Date: 09/03/2019 CLINICAL DATA:  Cough and fevers, EXAM: PORTABLE CHEST 1 VIEW  COMPARISON:  CT from the previous day. FINDINGS: Cardiac shadow is within normal limits. Aortic calcifications are noted. The left lung is clear. Right hilar and infrahilar mass lesion is noted similar to that noted on prior CT examination. No other focal abnormality is seen. IMPRESSION: Stable right hilar and infrahilar mass lesion similar to that seen on prior CT from 1 day previous. Electronically Signed   By: Inez Catalina M.D.   On: 09/03/2019 02:26     Labs:   Basic Metabolic Panel: Recent Labs  Lab 09/02/19 1711 09/02/19 1711 09/04/19 0741 09/04/19 0741 09/05/19 0718 09/05/19 0718 09/07/19 0505 09/08/19 0925  NA 133*  --  139  --  138  --  136 139  K 3.7   < > 2.9*   < > 3.9   < > 4.3 3.9  CL 97*  --  102  --  105  --  101 104  CO2 21*  --  25  --  24  --  25 23  GLUCOSE 131*  --  107*  --  108*  --  111* 130*  BUN 24*  --  15  --  14  --  10 22  CREATININE 1.51*  --  1.03  --  1.01  --  1.01 0.93  CALCIUM 8.6*  --  8.4*  --  8.5*  --  8.4* 8.7*  MG  --   --   --   --  1.7  --   --   --    < > = values in this interval not displayed.   GFR Estimated Creatinine Clearance: 83.6 mL/min (by C-G formula based on SCr of 0.93 mg/dL). Liver Function Tests: Recent Labs  Lab 09/02/19 1711 09/04/19 0741  AST 21 19  ALT 19 18  ALKPHOS 66 57  BILITOT 1.0 0.6  PROT 6.8 6.1*  ALBUMIN 3.1* 2.4*   No results for input(s): LIPASE, AMYLASE in the last 168 hours. No results for input(s): AMMONIA in the last 168 hours. Coagulation profile No results for input(s): INR, PROTIME in the last 168 hours.  CBC: Recent Labs  Lab 09/02/19 1711 09/02/19 1711 09/04/19 0741 09/05/19 0718 09/07/19 0505 09/08/19 0925 09/09/19 0500  WBC 16.4*   < > 11.6* 12.4* 12.3* 17.5* 15.9*  NEUTROABS 11.9*  --   --   --   --   --  12.7*  HGB 11.5*   < > 10.3* 10.7* 10.5* 10.5* 10.0*  HCT 35.5*   < > 32.8* 33.4* 33.3* 33.2* 31.7*  MCV 81.4   < > 82.8 83.3 84.5 83.2 85.0  PLT 367   < > 286 316 304  349 396   < > = values in this interval not displayed.   Cardiac Enzymes: No results for input(s): CKTOTAL, CKMB, CKMBINDEX, TROPONINI in the last 168 hours. BNP: Invalid input(s): POCBNP CBG: No results for input(s): GLUCAP in the last 168 hours. D-Dimer No results for input(s): DDIMER in the last 72 hours. Hgb A1c No results for input(s): HGBA1C in the last 72 hours. Lipid Profile No results for input(s): CHOL, HDL, LDLCALC, TRIG, CHOLHDL, LDLDIRECT in the last 72 hours. Thyroid function studies No results for input(s): TSH, T4TOTAL, T3FREE, THYROIDAB in the last 72 hours.  Invalid input(s): FREET3 Anemia work up No results for input(s): VITAMINB12, FOLATE, FERRITIN, TIBC, IRON, RETICCTPCT in the last 72 hours. Microbiology Recent Results (from the past 240 hour(s))  Culture, blood (Routine X 2) w Reflex to ID Panel     Status: None   Collection Time: 09/03/19  1:51 AM   Specimen: BLOOD RIGHT HAND  Result Value Ref Range Status   Specimen Description BLOOD RIGHT HAND  Final   Special Requests   Final    BOTTLES DRAWN AEROBIC AND ANAEROBIC Blood Culture adequate volume   Culture   Final    NO GROWTH 5 DAYS Performed at [facility], [place] [address] [place], [place] [address]    Report Status 09/08/2019 FINAL  Final  Culture, blood (Routine X 2) w Reflex to ID Panel     Status: None   Collection Time: 09/03/19  2:37 AM   Specimen: BLOOD  Result Value Ref Range Status   Specimen Description BLOOD LEFT ANTECUBITAL  Final   Special Requests   Final    BOTTLES DRAWN AEROBIC AND ANAEROBIC Blood Culture results may not be optimal due to an inadequate volume of blood received in culture bottles   Culture   Final    NO GROWTH 5 DAYS Performed at [facility], [place] [address], [place], [place] [address]    Report Status 09/08/2019 FINAL  Final  SARS Coronavirus 2 by RT PCR (hospital order, performed in [facility] hospital lab) Nasopharyngeal Nasopharyngeal Swab      Status: None   Collection Time: 09/03/19  4:13 AM   Specimen: Nasopharyngeal Swab  Result Value Ref Range Status   SARS Coronavirus 2 NEGATIVE NEGATIVE Final    Comment: (NOTE) SARS-CoV-2 target nucleic acids are NOT DETECTED.  The SARS-CoV-2 RNA is generally detectable in upper and lower respiratory specimens during the acute phase of infection. The lowest concentration of SARS-CoV-2 viral copies this assay can detect is 250 copies / mL. A negative result does not preclude SARS-CoV-2 infection and should not be used as the sole basis for treatment or other patient management decisions.  A negative result may occur with improper specimen collection / handling, submission of specimen other than nasopharyngeal swab, presence of viral mutation(s) within the areas targeted by this assay, and inadequate number of viral copies (<250 copies / mL). A negative result must be combined with clinical observations, patient history, and epidemiological information.  Fact Sheet for Patients:   StrictlyIdeas.no  Fact Sheet for Healthcare Providers: BankingDealers.co.za  This test is not yet approved or  cleared by the [place] FDA and has been authorized for detection and/or diagnosis of SARS-CoV-2 by FDA under an Emergency Use Authorization (EUA).  This EUA will remain in effect (meaning this test can be used) for the duration of the COVID-19 declaration under Section 564(b)(1) of the Act, 21 U.S.C. section 360bbb-3(b)(1), unless the authorization is terminated or revoked sooner.  Performed at [facility], [place] [address], [place], [place] [address]   Culture, respiratory     Status: None   Collection Time: 09/06/19 11:39 AM   Specimen: Bronchial Alveolar Lavage; Respiratory  Result Value Ref Range Status   Specimen Description BRONCHIAL ALVEOLAR LAVAGE  Final   Special Requests NONE  Final   Gram Stain NO WBC SEEN NO ORGANISMS  SEEN   Final   Culture   Final    NO GROWTH 2 DAYS Performed at [facility], [address], [place], [place] [address]    Report Status 09/08/2019 FINAL  Final     Discharge Instructions:   Discharge Instructions    Diet - low sodium heart healthy   Complete by: As directed    Discharge instructions   Complete by: As directed    Follow-up with Dr. Earlie Server, oncologist, as scheduled on 09/14/2019 at 4 PM.  Hydrochlorothiazide has been discontinued because of suspected gout flare.   Increase activity slowly   Complete by: As directed      Allergies as of 09/09/2019   No Known Allergies     Medication List    STOP taking these medications   hydrochlorothiazide 25 MG tablet Commonly known as: HYDRODIURIL   levofloxacin 500 MG tablet Commonly known as: LEVAQUIN   predniSONE 20 MG tablet Commonly known as: Deltasone     TAKE these medications   albuterol 108 (90 Base) MCG/ACT inhaler Commonly known as: VENTOLIN HFA Inhale 2 puffs into the lungs every 4 (four) hours as needed for wheezing or shortness of breath.   amitriptyline 75 MG tablet Commonly known as: ELAVIL Take 1 tablet (75 mg total) by mouth at bedtime.   amLODipine 10 MG tablet Commonly known as: NORVASC TAKE 1 TABLET BY MOUTH EVERY DAY   aspirin EC 81 MG tablet Take 81 mg by mouth daily.   atorvastatin 20 MG tablet Commonly known as: LIPITOR  TAKE 1 TABLET BY MOUTH EVERY DAY   [employer] Fish Oil 1000 MG Caps TAKE 1 CAPSULE BY MOUTH EVERY DAY WITH A MEAL What changed:   how much to take  how to take this  when to take this   ibuprofen 600 MG tablet Commonly known as: ADVIL Take 1 tablet (600 mg total) by mouth 3 (three) times daily for 3 days.   Lyrica 100 MG capsule Generic drug: pregabalin Take 100 mg by mouth 2 (two) times daily.   oxyCODONE-acetaminophen 10-325 MG tablet Commonly known as: PERCOCET Take 1 tablet by mouth as needed for pain (up to five times daily).   tiZANidine  4 MG tablet Commonly known as: ZANAFLEX TAKE 1 TABLET BY MOUTH TWICE A DAY What changed:   how much to take  how to take this  when to take this   zolpidem 10 MG tablet Commonly known as: AMBIEN Take 1 tablet (10 mg total) by mouth at bedtime as needed.            Durable Medical Equipment  (From admission, onward)         Start     Ordered   09/09/19 1139  DME Oxygen  Once       Question Answer Comment  Length of Need Lifetime   Mode or (Route) Nasal cannula   Liters per Minute 2   Frequency Continuous (stationary and portable oxygen unit needed)   Oxygen delivery system Gas      09/09/19 1141          Follow-up Information    Pleas Koch, NP. Schedule an appointment as soon as possible for a visit in 1 week(s).   Specialty: Internal Medicine Contact information: [place] [address] [phone]        Candee Furbish, MD Follow up.   Specialty: Pulmonary Disease Why: office will call for a follow up appt-if you do not hear from them, please give them a call.  Contact information: [place] [address] [phone]                Time coordinating discharge: 33 minutes  Signed:  Jennye Boroughs  [facility] 09/09/2019, 4:40 PM

## 2019-09-09 NOTE — Progress Notes (Signed)
Pt walked in hallway without oxygen; lowest saturation is 88; highest was 93%. Notified MD of results.

## 2019-09-09 NOTE — TOC Transition Note (Signed)
Transition of Care [facility]) - CM/SW Discharge Note   Patient Details  Name: Jerry Wong MRN: [id number] Date of Birth: 1945/09/04  Transition of Care [facility]) CM/SW Contact:  Angelita Ingles, RN Phone Number: [phone]  09/09/2019, 3:12 PM   Clinical Narrative:  CM consulted for Home with oxygen. Home O2 set up with [employer]. [employer] at bedside to deliver portable tank and set up home delivery. Bedside nurse made aware that O2 is set up. No further needs noted at this time. CM will sign off.     Final next level of care: Home/Self Care Barriers to Discharge: No Barriers Identified   Patient Goals and CMS Choice        Discharge Placement                       Discharge Plan and Services                DME Arranged: Oxygen DME Agency: Other - Comment [facility]) Date DME Agency Contacted: 09/09/19 Time DME Agency Contacted: [phone] Representative spoke with at DME Agency: Brenton Grills HH Arranged: NA [place] Agency: NA        Social Determinants of Health ([place]) Interventions     Readmission Risk Interventions No flowsheet data found.

## 2019-09-10 ENCOUNTER — Telehealth: Payer: Self-pay | Admitting: Adult Health

## 2019-09-10 ENCOUNTER — Telehealth: Payer: Self-pay

## 2019-09-10 MED ORDER — AMOXICILLIN-POT CLAVULANATE 875-125 MG PO TABS
1.0000 | ORAL_TABLET | Freq: Two times a day (BID) | ORAL | 0 refills | Status: DC
Start: 2019-09-10 — End: 2019-10-13

## 2019-09-10 MED ORDER — PREDNISONE 20 MG PO TABS: 40.0000 mg | ORAL_TABLET | Freq: Every day | ORAL | 0 refills | Status: AC

## 2019-09-10 NOTE — Telephone Encounter (Signed)
Dr. Tamala Julian, please advise what you recommend since pt has spiked a fever that high?

## 2019-09-10 NOTE — Telephone Encounter (Signed)
Transition Care Management Follow-up Telephone Call  Date of discharge and from where: Jerry Wong 09/09/2019  How have you been since you were released from the hospital? Fever has come back  Any questions or concerns? No   Items Reviewed:  Did the pt receive and understand the discharge instructions provided? Yes   Any new allergies since your discharge? No   Dietary orders reviewed? Yes  Do you have support at home? Yes   Functional Questionnaire: (I = Independent and D = Dependent) ADLs: I  Bathing/Dressing- I  Meal Prep- I  Eating- I  Maintaining continence- I  Transferring/Ambulation- I  Managing Meds- I  Follow up appointments reviewed:   PCP Hospital f/u appt confirmed? No Patient does not feel there is anything that she can do for him.  Are transportation arrangements needed? No   If their condition worsens, is the pt aware to call PCP or go to the Emergency Dept.? Yes  Was the patient provided with contact information for the PCP's office or ED? Yes  Was to pt encouraged to call back with questions or concerns? Yes

## 2019-09-10 NOTE — Telephone Encounter (Signed)
Noted, he does have an appointment with oncology soon.

## 2019-09-10 NOTE — Telephone Encounter (Signed)
Febrile, still coughing Could be lingering PNA vs. Related to cancer. Will tx with augmentin/steroids and patient will call Monday if no improvement.  Erskine Emery MD PCCM

## 2019-09-14 ENCOUNTER — Encounter: Payer: Self-pay | Admitting: Internal Medicine

## 2019-09-14 ENCOUNTER — Inpatient Hospital Stay: Payer: Medicare Other | Admitting: Internal Medicine

## 2019-09-14 ENCOUNTER — Other Ambulatory Visit: Payer: Self-pay

## 2019-09-14 ENCOUNTER — Inpatient Hospital Stay: Payer: Medicare Other | Attending: Internal Medicine

## 2019-09-14 VITALS — BP 124/69 | HR 91 | Temp 98.1°F | Resp 18 | Ht 69.0 in | Wt 203.7 lb

## 2019-09-14 DIAGNOSIS — C3491 Malignant neoplasm of unspecified part of right bronchus or lung: Secondary | ICD-10-CM

## 2019-09-14 DIAGNOSIS — Z79899 Other long term (current) drug therapy: Secondary | ICD-10-CM | POA: Insufficient documentation

## 2019-09-14 DIAGNOSIS — Z5112 Encounter for antineoplastic immunotherapy: Secondary | ICD-10-CM

## 2019-09-14 DIAGNOSIS — D329 Benign neoplasm of meninges, unspecified: Secondary | ICD-10-CM | POA: Insufficient documentation

## 2019-09-14 DIAGNOSIS — I129 Hypertensive chronic kidney disease with stage 1 through stage 4 chronic kidney disease, or unspecified chronic kidney disease: Secondary | ICD-10-CM | POA: Insufficient documentation

## 2019-09-14 DIAGNOSIS — Z5111 Encounter for antineoplastic chemotherapy: Secondary | ICD-10-CM | POA: Diagnosis not present

## 2019-09-14 DIAGNOSIS — Z7982 Long term (current) use of aspirin: Secondary | ICD-10-CM | POA: Insufficient documentation

## 2019-09-14 DIAGNOSIS — Z7189 Other specified counseling: Secondary | ICD-10-CM

## 2019-09-14 DIAGNOSIS — Z87891 Personal history of nicotine dependence: Secondary | ICD-10-CM | POA: Insufficient documentation

## 2019-09-14 DIAGNOSIS — E279 Disorder of adrenal gland, unspecified: Secondary | ICD-10-CM | POA: Diagnosis not present

## 2019-09-14 DIAGNOSIS — M1990 Unspecified osteoarthritis, unspecified site: Secondary | ICD-10-CM | POA: Insufficient documentation

## 2019-09-14 DIAGNOSIS — I10 Essential (primary) hypertension: Secondary | ICD-10-CM

## 2019-09-14 DIAGNOSIS — N189 Chronic kidney disease, unspecified: Secondary | ICD-10-CM | POA: Insufficient documentation

## 2019-09-14 DIAGNOSIS — G629 Polyneuropathy, unspecified: Secondary | ICD-10-CM | POA: Insufficient documentation

## 2019-09-14 DIAGNOSIS — Z9079 Acquired absence of other genital organ(s): Secondary | ICD-10-CM | POA: Diagnosis not present

## 2019-09-14 DIAGNOSIS — E785 Hyperlipidemia, unspecified: Secondary | ICD-10-CM | POA: Insufficient documentation

## 2019-09-14 DIAGNOSIS — C61 Malignant neoplasm of prostate: Secondary | ICD-10-CM

## 2019-09-14 DIAGNOSIS — G4700 Insomnia, unspecified: Secondary | ICD-10-CM | POA: Diagnosis not present

## 2019-09-14 DIAGNOSIS — Z8546 Personal history of malignant neoplasm of prostate: Secondary | ICD-10-CM | POA: Diagnosis not present

## 2019-09-14 DIAGNOSIS — R918 Other nonspecific abnormal finding of lung field: Secondary | ICD-10-CM | POA: Diagnosis not present

## 2019-09-14 LAB — CBC WITH DIFFERENTIAL (CANCER CENTER ONLY)
Abs Immature Granulocytes: 0.38 10*3/uL — ABNORMAL HIGH (ref 0.00–0.07)
Basophils Absolute: 0 10*3/uL (ref 0.0–0.1)
Basophils Relative: 0 %
Eosinophils Absolute: 0 10*3/uL (ref 0.0–0.5)
Eosinophils Relative: 0 %
HCT: 34 % — ABNORMAL LOW (ref 39.0–52.0)
Hemoglobin: 11 g/dL — ABNORMAL LOW (ref 13.0–17.0)
Immature Granulocytes: 2 %
Lymphocytes Relative: 5 %
Lymphs Abs: 0.9 10*3/uL (ref 0.7–4.0)
MCH: 26.7 pg (ref 26.0–34.0)
MCHC: 32.4 g/dL (ref 30.0–36.0)
MCV: 82.5 fL (ref 80.0–100.0)
Monocytes Absolute: 1 10*3/uL (ref 0.1–1.0)
Monocytes Relative: 6 %
Neutro Abs: 15.8 10*3/uL — ABNORMAL HIGH (ref 1.7–7.7)
Neutrophils Relative %: 87 %
Platelet Count: 546 10*3/uL — ABNORMAL HIGH (ref 150–400)
RBC: 4.12 MIL/uL — ABNORMAL LOW (ref 4.22–5.81)
RDW: 15.3 % (ref 11.5–15.5)
WBC Count: 18.2 10*3/uL — ABNORMAL HIGH (ref 4.0–10.5)
nRBC: 0 % (ref 0.0–0.2)

## 2019-09-14 LAB — CMP (CANCER CENTER ONLY)
ALT: 27 U/L (ref 0–44)
AST: 15 U/L (ref 15–41)
Albumin: 3.1 g/dL — ABNORMAL LOW (ref 3.5–5.0)
Alkaline Phosphatase: 93 U/L (ref 38–126)
Anion gap: 11 (ref 5–15)
BUN: 22 mg/dL (ref 8–23)
CO2: 23 mmol/L (ref 22–32)
Calcium: 10 mg/dL (ref 8.9–10.3)
Chloride: 104 mmol/L (ref 98–111)
Creatinine: 1.19 mg/dL (ref 0.61–1.24)
GFR, Est AFR Am: >60 mL/min (ref >60)
GFR, Estimated: >60 mL/min (ref >60)
Glucose, Bld: 119 mg/dL — ABNORMAL HIGH (ref 70–99)
Potassium: 4.7 mmol/L (ref 3.5–5.1)
Sodium: 138 mmol/L (ref 135–145)
Total Bilirubin: 0.3 mg/dL (ref 0.3–1.2)
Total Protein: 7.5 g/dL (ref 6.5–8.1)

## 2019-09-14 MED ORDER — PROCHLORPERAZINE MALEATE 10 MG PO TABS
10.0000 mg | ORAL_TABLET | Freq: Four times a day (QID) | ORAL | 0 refills | Status: DC | PRN
Start: 2019-09-14 — End: 2019-10-19

## 2019-09-14 NOTE — Progress Notes (Signed)
[place] [phone]   [phone]  CONSULT NOTE  REFERRING PHYSICIAN: Dr. Ina Homes  REASON FOR CONSULTATION:  74 years old white male recently diagnosed with lung cancer.  HPI Jerry Wong is a 74 y.o. male.  With past medical history significant for hypertension, dyslipidemia, chronic kidney disease, history of prostate cancer in 1999 status post surgical resection and hormonal therapy Peripheral neuropathy, kidney stone, cataract, osteoarthritis as well as remote history of smoking for around 15 years but quit in 1981.  The patient mentioned that he has been complaining of cough as well as shortness of breath and fever for around 2 weeks.  He was seen by his primary care provider and treated with antibiotics for 10 days with no improvement in his symptoms.  On a follow-up visit he has a chest x-ray performed on 08/25/2019 and it showed enlargement right hilar and right middle lobe opacity.  The patient continued further treatment with antibiotics.  He had CT scan of the chest on 09/02/2019 and it showed an ill-defined right hilar lung mass measuring approximately 5.1 x 3.9 cm.  There may be postobstructive pneumonia in the right lower lobe.  The scan also showed enlarged mediastinal lymph nodes including necrotic appearing subcarinal lymph node measuring 2.3 cm.  Additionally enlarged mediastinal lymph nodes are also noted.  There were also multiple pathologically enlarged with, necrotic right hilar lymph nodes.  Scan of the upper abdomen showed left adrenal mass measuring approximately 2.3 cm.  The patient was seen by Dr. Tamala Julian and on September 06, 2019 he underwent flexible bronchoscopy with endobronchial ultrasound and biopsy of the right lower lobe as well as fine-needle aspiration of lymph node from station 7. The final cytology ([id number]) showed malignant cells. Immunohistochemistry for MOC31 and CK7 is strong and diffusely positive. TTF-1 is positive in  rare cells. CK5/6 is weakly positive in rare cells. CK20, Napsin-A, CDX-2, GATA-3, PAX 8, p40, PSA, Prostein, S-100, Melan-A and D2-40 are negative. The morphology is most consistent with non-small cell carcinoma; however, immunohistochemistry  fails to provide further/more definitive characterization.  The tissue block was sent to [facility] for molecular studies and PD-L1 expression.  The patient also had MRI of the brain on September 09, 2019 that showed no evidence of metastatic disease to the brain.  He was referred to me today for evaluation and recommendation regarding treatment of his condition. When seen today he continues to have shortness of breath and cough but no significant chest pain or hemoptysis.  He denied having any significant weight loss or night sweats.  He has no nausea, vomiting, diarrhea or constipation.  He denied having any headache or visual changes. Family history significant for mother with COPD.  Father had lung cancer.  Brother had COPD. The patient is married and has 1 son and 1 stepson.  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  He was accompanied today by his wife [name].  The patient smoked for around 15 years but quit in 1981.  He has no history of alcohol or drug abuse.   HPI  Past Medical History:  Diagnosis Date  . Arthritis   . Cataract   . Chickenpox   . Chronic headaches   . Chronic kidney disease   . Chronic neck and back pain   . History of kidney stones   . Hyperlipidemia   . Hypertension   . Insomnia   . Neuromuscular disorder ([place])   .  Prostate cancer [facility])     Past Surgical History:  Procedure Laterality Date  . BACK SURGERY  1974  . BRONCHIAL NEEDLE ASPIRATION BIOPSY  09/06/2019   Procedure: BRONCHIAL NEEDLE ASPIRATION BIOPSIES;  Surgeon: Candee Furbish, MD;  Location: [facility] ENDOSCOPY;  Service: Pulmonary;;  . BRONCHIAL WASHINGS  09/06/2019   Procedure: BRONCHIAL WASHINGS;  Surgeon: Candee Furbish, MD;  Location: [facility]  ENDOSCOPY;  Service: Pulmonary;;  . NECK SURGERY  2002  . PROSTATECTOMY    . TONSILLECTOMY  1958  . TRIGGER FINGER RELEASE Left 11/20/2017   left thumb/Dr. Apolonio Schneiders  . VIDEO BRONCHOSCOPY WITH ENDOBRONCHIAL ULTRASOUND N/A 09/06/2019   Procedure: VIDEO BRONCHOSCOPY WITH ENDOBRONCHIAL ULTRASOUND;  Surgeon: Candee Furbish, MD;  Location: [facility] ENDOSCOPY;  Service: Pulmonary;  Laterality: N/A;    Family History  Problem Relation Age of Onset  . Arthritis Mother   . Lung cancer Mother   . Arthritis Father   . Lung cancer Father   . Hyperlipidemia Father   . Hypertension Father   . Stroke Father   . Prostate cancer Brother   . Stomach cancer Maternal Grandmother   . Colon cancer Neg Hx   . Esophageal cancer Neg Hx   . Pancreatic cancer Neg Hx   . Rectal cancer Neg Hx     Social History Social History   Tobacco Use  . Smoking status: Former Smoker    Packs/day: 0.30    Years: 11.00    Pack years: 3.30  . Smokeless tobacco: Never Used  . Tobacco comment: quit 35-40 years ago per pt  Vaping Use  . Vaping Use: Never used  Substance Use Topics  . Alcohol use: Not Currently  . Drug use: No    No Known Allergies  Current Outpatient Medications  Medication Sig Dispense Refill  . albuterol (VENTOLIN HFA) 108 (90 Base) MCG/ACT inhaler Inhale 2 puffs into the lungs every 4 (four) hours as needed for wheezing or shortness of breath. 18 g 0  . amitriptyline (ELAVIL) 75 MG tablet Take 1 tablet (75 mg total) by mouth at bedtime. 90 tablet 3  . amLODipine (NORVASC) 10 MG tablet TAKE 1 TABLET BY MOUTH EVERY DAY (Patient taking differently: Take 10 mg by mouth daily. ) 90 tablet 2  . amoxicillin-clavulanate (AUGMENTIN) 875-125 MG tablet Take 1 tablet by mouth 2 (two) times daily. 14 tablet 0  . aspirin EC 81 MG tablet Take 81 mg by mouth daily.     Marland Kitchen atorvastatin (LIPITOR) 20 MG tablet TAKE 1 TABLET BY MOUTH EVERY DAY (Patient taking differently: Take 20 mg by mouth daily. ) 90 tablet 1  .  LYRICA 100 MG capsule Take 100 mg by mouth 2 (two) times daily.   2  . Omega-3 Fatty Acids ([employer] FISH OIL) 1000 MG CAPS TAKE 1 CAPSULE BY MOUTH EVERY DAY WITH A MEAL (Patient taking differently: Take 1,000 mg by mouth daily. TAKE 1 CAPSULE BY MOUTH EVERY DAY WITH A MEAL) 90 capsule 3  . oxyCODONE-acetaminophen (PERCOCET) 10-325 MG per tablet Take 1 tablet by mouth as needed for pain (up to five times daily).     . predniSONE (DELTASONE) 20 MG tablet Take 2 tablets (40 mg total) by mouth daily with breakfast for 7 days. 14 tablet 0  . zolpidem (AMBIEN) 10 MG tablet Take 1 tablet (10 mg total) by mouth at bedtime as needed. 30 tablet 4  . tiZANidine (ZANAFLEX) 4 MG tablet TAKE 1 TABLET BY MOUTH TWICE A DAY (Patient  taking differently: Take 4 mg by mouth 2 (two) times daily. TAKE 1 TABLET BY MOUTH TWICE A DAY) 180 tablet 3   No current facility-administered medications for this visit.    Review of Systems  Constitutional: positive for fatigue Eyes: negative Ears, nose, mouth, throat, and face: negative Respiratory: positive for cough and dyspnea on exertion Cardiovascular: negative Gastrointestinal: negative Genitourinary:negative Integument/breast: negative Hematologic/lymphatic: negative Musculoskeletal:negative Neurological: negative Behavioral/Psych: negative Endocrine: negative Allergic/Immunologic: negative  Physical Exam  ZGY:FVCBS, healthy, no distress, well nourished, well developed and anxious SKIN: skin color, texture, turgor are normal, no rashes or significant lesions HEAD: Normocephalic, No masses, lesions, tenderness or abnormalities EYES: normal, PERRLA, Conjunctiva are pink and non-injected EARS: External ears normal, Canals clear OROPHARYNX:no exudate, no erythema and lips, buccal mucosa, and tongue normal  NECK: supple, no adenopathy, no JVD LYMPH:  no palpable lymphadenopathy, no hepatosplenomegaly BREAST:not examined LUNGS: clear to auscultation , and palpation  HEART: regular rate & rhythm, no murmurs and no gallops ABDOMEN:abdomen soft, non-tender, obese, normal bowel sounds and no masses or organomegaly BACK: No CVA tenderness, Range of motion is normal EXTREMITIES:no joint deformities, effusion, or inflammation, no edema  NEURO: alert & oriented x 3 with fluent speech, no focal motor/sensory deficits  PERFORMANCE STATUS: ECOG 1  LABORATORY DATA: Lab Results  Component Value Date   WBC 18.2 (H) 09/14/2019   HGB 11.0 (L) 09/14/2019   HCT 34.0 (L) 09/14/2019   MCV 82.5 09/14/2019   PLT 546 (H) 09/14/2019      Chemistry      Component Value Date/Time   NA 138 09/14/2019 1517   K 4.7 09/14/2019 1517   CL 104 09/14/2019 1517   CO2 23 09/14/2019 1517   BUN 22 09/14/2019 1517   CREATININE 1.19 09/14/2019 1517   CREATININE 1.21 (H) 01/15/2019 1541      Component Value Date/Time   CALCIUM 10.0 09/14/2019 1517   ALKPHOS 93 09/14/2019 1517   AST 15 09/14/2019 1517   ALT 27 09/14/2019 1517   BILITOT 0.3 09/14/2019 1517       RADIOGRAPHIC STUDIES: DG Chest 2 View  Addendum Date: 08/25/2019   ADDENDUM REPORT: 08/25/2019 14:39 ADDENDUM: These results were called by telephone at the time of interpretation on 08/25/2019 at 2:39 pm to provider [facility] , who verbally acknowledged these results. Electronically Signed   By: Franchot Gallo M.D.   On: 08/25/2019 14:39   Result Date: 08/25/2019 CLINICAL DATA:  Cough and short of breath.  Fever 2 weeks. EXAM: CHEST - 2 VIEW COMPARISON:  11/30/2012 FINDINGS: Right middle lobe airspace density. The right lower hilum appears enlarged. Prominent interstitial markings are present in the right lung base laterally. Findings suspicious for lung carcinoma possibly was pneumonia. Close follow-up warranted. Heart size normal. Negative for heart failure. Left lung clear. Small pleural effusion. IMPRESSION: Enlargement of the right hilum with right middle lobe mass/pneumonia. Findings suspicious  for carcinoma lung. Given the patient's current symptoms in fever, follow-up chest x-ray recommended in 2-4 weeks. CT chest with contrast may also be indicated . Electronically Signed: By: Franchot Gallo M.D. On: 08/25/2019 14:23   CT Chest W Contrast  Result Date: 09/02/2019 CLINICAL DATA:  Daily fevers. Shortness of breath x3 weeks. Abnormal chest x-ray. EXAM: CT CHEST WITH CONTRAST TECHNIQUE: Multidetector CT imaging of the chest was performed during intravenous contrast administration. CONTRAST:  26m OMNIPAQUE IOHEXOL 300 MG/ML  SOLN COMPARISON:  Chest x-ray dated August 25, 2019 FINDINGS: Cardiovascular: There are atherosclerotic changes  of the thoracic aorta without evidence for dissection or aneurysm. The main pulmonary artery is not significantly dilated. There is no large centrally located pulmonary embolism. Detection of smaller pulmonary emboli is severely limited by contrast bolus timing. There are coronary artery calcifications. There is a trace pericardial effusion. The heart size is normal. Mediastinum/Nodes: --there are enlarged mediastinal lymph nodes. For example there is a necrotic appearing subcarinal lymph node measuring 2.3 cm (axial series 2, image 83). Additional enlarged mediastinal lymph nodes are noted. --multiple pathologically enlarged, necrotic right hilar lymph nodes are noted. -- No axillary lymphadenopathy. -- No supraclavicular lymphadenopathy. -- Normal thyroid gland where visualized. -  Unremarkable esophagus. Lungs/Pleura: There is an ill-defined right hilar lung mass measuring approximately 5.1 x 3.9 cm (axial series 2, image 96). There may be postobstructive pneumonia in the right lower lobe. There is interlobular septal thickening involving the right lower lobe. There is atelectasis at the left lung base. Upper Abdomen: There is a left adrenal mass measuring approximately 2.3 cm (axial series 2, image 160). Multiple renal cysts are noted bilaterally. Musculoskeletal: No  chest wall abnormality. No bony spinal canal stenosis. IMPRESSION: 1. Ill-defined right hilar lung mass measuring approximately 5.1 x 3.9 cm with multiple pathologically enlarged, necrotic appearing mediastinal and right hilar lymph nodes. 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 2. Indeterminate 2.3 cm left adrenal mass. While this may represent a benign adenoma, a metastatic lesion is not excluded. 3. Trace pericardial effusion. Aortic Atherosclerosis (ICD10-I70.0). Electronically Signed   By: Constance Holster M.D.   On: 09/02/2019 16:07   MR BRAIN W WO CONTRAST  Result Date: 09/09/2019 CLINICAL DATA:  Lung mass, staging EXAM: MRI HEAD WITHOUT AND WITH CONTRAST TECHNIQUE: Multiplanar, multiecho pulse sequences of the brain and surrounding structures were obtained without and with intravenous contrast. CONTRAST:  9.74m GADAVIST GADOBUTROL 1 MMOL/ML IV SOLN COMPARISON:  None. FINDINGS: Brain: There is no acute infarction or intracranial hemorrhage. There is a 5 mm dural-based enhancing lesion along right frontoparietal convexity (series 10, image 34). There is no parenchymal mass, mass effect, or edema. There is no hydrocephalus or extra-axial fluid collection. Ventricles and sulci are within normal limits in size and configuration. Vascular: Major vessel flow voids at the skull base are preserved. Skull and upper cervical spine: Normal marrow signal is preserved. Sinuses/Orbits: Minor mucosal thickening. Bilateral lens replacements. Other: Sella is unremarkable. Minor patchy mastoid fluid opacification. IMPRESSION: No evidence of intracranial metastatic disease. Subcentimeter right frontoparietal convexity meningioma. Electronically Signed   By: PMacy MisM.D.   On: 09/09/2019 12:58   DG Chest Port 1 View  Result Date: 09/07/2019 CLINICAL DATA:  Shortness of breath. EXAM: PORTABLE CHEST 1 VIEW COMPARISON:  09/03/2019 FINDINGS: The cardiac silhouette is normal in size. Right hilar  enlargement and infrahilar density are unchanged and correspond to lymphadenopathy and an infrahilar mass on CT with unchanged airspace opacity more laterally in the right lower lobe. The left lung remains clear. No pleural effusion or pneumothorax is identified. Prior anterior cervical fusion is noted. IMPRESSION: Unchanged right hilar/infrahilar mass and lower lobe airspace opacity. Electronically Signed   By: ALogan BoresM.D.   On: 09/07/2019 08:47   DG Chest Port 1 View  Result Date: 09/03/2019 CLINICAL DATA:  Cough and fevers, EXAM: PORTABLE CHEST 1 VIEW COMPARISON:  CT from the previous day. FINDINGS: Cardiac shadow is within normal limits. Aortic calcifications are noted. The left lung is clear. Right hilar and infrahilar mass lesion is noted similar to that  noted on prior CT examination. No other focal abnormality is seen. IMPRESSION: Stable right hilar and infrahilar mass lesion similar to that seen on prior CT from 1 day previous. Electronically Signed   By: Inez Catalina M.D.   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 Further staging work-up is still pending   PLAN: I had a lengthy discussion with the patient and his wife today about his current disease stage, prognosis and treatment options. I recommended for the patient to complete the staging work-up by having his PET scan performed later this week as previously scheduled. I discussed with the patient his treatment options and if he has no evidence of metastatic disease on the upcoming PET scan, the patient may benefit from a course of concurrent chemoradiation with weekly carboplatin for AUC of 2 and paclitaxel 45 mg/M2 during the course of radiation. I discussed with the patient the adverse effect of this  treatment including but not limited to alopecia, myelosuppression, nausea and vomiting, peripheral neuropathy, liver or renal dysfunction. He is expected to start the first cycle of this treatment on September 27, 2019 unless the PET scan showed any metastatic disease, then the treatment plan will change. We also send his tissue block for molecular studies to look for actionable mutations. We will arrange for the patient to have a chemotherapy education class before the first dose of his treatment. We will send prescription for Compazine 10 mg p.o. every 6 hours as needed for nausea to his pharmacy. The patient will come back for follow-up visit with the start of cycle #1. He was advised to call immediately if he has any other concerning symptoms in the interval.  The patient voices understanding of current disease status and treatment options and is in agreement with the current care plan.  All questions were answered. The patient knows to call the clinic with any problems, questions or concerns. We can certainly see the patient much sooner if necessary.  Thank you so much for allowing me to participate in the care of Roshawn Coen. I will continue to follow up the patient with you and assist in his care.  The total time spent in the appointment was 90 minutes.  Disclaimer: This note was dictated with voice recognition software. Similar sounding words can inadvertently be transcribed and may not be corrected upon review.   Eilleen Kempf September 14, 2019, 4:02 PM

## 2019-09-14 NOTE — Progress Notes (Signed)

## 2019-09-16 ENCOUNTER — Encounter: Payer: Self-pay | Admitting: *Deleted

## 2019-09-16 DIAGNOSIS — C3491 Malignant neoplasm of unspecified part of right bronchus or lung: Secondary | ICD-10-CM

## 2019-09-16 NOTE — Progress Notes (Signed)
I followed up on Jerry Wong's schedule.  He is set up for his PET scan but needs referral to [place].  I will completed referral per Dr. Julien Nordmann.

## 2019-09-17 ENCOUNTER — Other Ambulatory Visit: Payer: Self-pay

## 2019-09-17 ENCOUNTER — Encounter (HOSPITAL_COMMUNITY)
Admission: RE | Admit: 2019-09-17 | Discharge: 2019-09-17 | Disposition: A | Discharge status: Home / Self Care | Payer: Medicare Other | Source: Ambulatory Visit | Attending: Internal Medicine | Admitting: Internal Medicine

## 2019-09-17 DIAGNOSIS — I700 Atherosclerosis of aorta: Secondary | ICD-10-CM | POA: Diagnosis not present

## 2019-09-17 DIAGNOSIS — E278 Other specified disorders of adrenal gland: Secondary | ICD-10-CM | POA: Diagnosis not present

## 2019-09-17 DIAGNOSIS — C3490 Malignant neoplasm of unspecified part of unspecified bronchus or lung: Secondary | ICD-10-CM

## 2019-09-17 DIAGNOSIS — C7951 Secondary malignant neoplasm of bone: Secondary | ICD-10-CM | POA: Diagnosis not present

## 2019-09-17 LAB — GLUCOSE, CAPILLARY: Glucose-Capillary: 111 mg/dL — ABNORMAL HIGH (ref 70–99)

## 2019-09-17 MED ORDER — FLUDEOXYGLUCOSE F - 18 (FDG) INJECTION
10.4000 | Freq: Once | INTRAVENOUS | Status: AC | PRN
  Administered 2019-09-17: 10.4 via INTRAVENOUS

## 2019-09-20 ENCOUNTER — Telehealth: Payer: Self-pay | Admitting: Internal Medicine

## 2019-09-20 DIAGNOSIS — R05 Cough: Secondary | ICD-10-CM

## 2019-09-20 DIAGNOSIS — R059 Cough, unspecified: Secondary | ICD-10-CM

## 2019-09-20 NOTE — Telephone Encounter (Signed)
Called and spoke with patient's wife. She states that on Saturday patient finished Prednisone and Amoxicillin. She states he was doing ok but yesterday his fever came back and is 102. Patient had PET scan 7/23. Has a Dry cough   Sarah please advise I believe Dr. Tamala Julian is on vacation

## 2019-09-20 NOTE — Addendum Note (Signed)
Addended by: Rosana Berger on: 09/20/2019 01:56 PM   Modules accepted: Orders

## 2019-09-20 NOTE — Telephone Encounter (Signed)
He needs to be seen with a CXR prior.

## 2019-09-20 NOTE — Telephone Encounter (Signed)
Spoke with the pt's spouse and scheduled appt with TP for 9 am with cxr

## 2019-09-21 ENCOUNTER — Encounter: Payer: Self-pay | Admitting: Adult Health

## 2019-09-21 ENCOUNTER — Other Ambulatory Visit: Payer: Self-pay

## 2019-09-21 ENCOUNTER — Ambulatory Visit: Payer: Medicare Other | Admitting: Adult Health

## 2019-09-21 ENCOUNTER — Ambulatory Visit (INDEPENDENT_AMBULATORY_CARE_PROVIDER_SITE_OTHER): Payer: Medicare Other

## 2019-09-21 ENCOUNTER — Encounter: Payer: Self-pay | Admitting: *Deleted

## 2019-09-21 DIAGNOSIS — J9611 Chronic respiratory failure with hypoxia: Secondary | ICD-10-CM | POA: Diagnosis not present

## 2019-09-21 DIAGNOSIS — J189 Pneumonia, unspecified organism: Secondary | ICD-10-CM

## 2019-09-21 DIAGNOSIS — R05 Cough: Secondary | ICD-10-CM | POA: Diagnosis not present

## 2019-09-21 DIAGNOSIS — R509 Fever, unspecified: Secondary | ICD-10-CM | POA: Diagnosis not present

## 2019-09-21 DIAGNOSIS — R059 Cough, unspecified: Secondary | ICD-10-CM

## 2019-09-21 DIAGNOSIS — C3491 Malignant neoplasm of unspecified part of right bronchus or lung: Secondary | ICD-10-CM

## 2019-09-21 MED ORDER — AMOXICILLIN-POT CLAVULANATE 875-125 MG PO TABS
1.0000 | ORAL_TABLET | Freq: Two times a day (BID) | ORAL | 0 refills | Status: AC
Start: 2019-09-21 — End: 2019-10-01

## 2019-09-21 NOTE — Progress Notes (Signed)
@Patient  ID: Jerry Wong, male    DOB: [birth year], 74 y.o.   MRN: [id number]  Chief Complaint  Patient presents with  . Acute Visit    Cough     Referring provider: Pleas Koch, NP  HPI: 74 year old male former smoker (quit 1981) seen for pulmonary consult while hospitalized 09/06/2019 for lung mass. PET scan consistent with metastatic disease. patient underwent navigational bronchoscopy/EBUS that was positive for non-small cell lung carcinoma.    TEST/EVENTS :  CT chest 09/02/2019 showed ill-defined right hilar lung mass measuring 5.1 x 3.9 cm with multiple pathologically enlarged necrotic appearing mediastinal and right hilar lymph nodes. 2.3 left adrenal mass  MRI brain 09/09/2019 - for metastatic disease subcentimeter right meningioma  PET scan 0/09/6759 hypermetabolic hilar mass and nodal disease both in the right hilum contralateral mediastinum and thoracic inlet also associated bony metastatic disease in adrenal metastasis. Masslike area in the right chest with ill-defined margins 7.9 x 4.4 cm. SUV max 13.3. Mediastinal, subcarinal, right paratracheal and anterior mediastinal hypermetabolic lymph nodes. Bilateral adrenal lesions with increased metabolic activity SUV max 22. Multifocal bony metastatic disease, right proximal humerus, left second rib lesion focal uptake [place] 3, left T12 and T1., L2, L4, left iliac crest, proximal left femur and posterior left seventh rib and right iliac crest subtle uptake in the right distal clavicle.  09/06/19 Path: Lung  The final cytology ([id number]) showed malignant cells. Immunohistochemistry for MOC31 and CK7 is strong and diffusely positive. TTF-1 is positive in rare cells. CK5/6 is weakly positive in rare cells. CK20, Napsin-A, CDX-2, GATA-3, PAX 8, p40, PSA, Prostein, S-100, Melan-A and D2-40 are negative. The morphology is most consistent with non-small cell carcinoma; however, immunohistochemistry  fails to provide further/more  definitive characterization.  The tissue block was sent to [facility] for molecular studies and PD-L1 expression.  09/21/2019 Acute OV : Cough  Patient presents for an acute office visit. Patient was recently hospitalized with acute cough congestion. Found to have a right lower lung mass. He presented with a postobstructive pneumonia acute respiratory distress. He was treated with aggressive antibiotics during hospitalization . Patient underwent an a work-up. Seen by pulmonary underwent navigational bronchoscopy and EBUS with pathology positive for non-small cell carcinoma. Patient has been seen by oncology with Dr. Earlie Server on 09/14/2019. Plans are to proceed with chemoradiation. Patient has been having difficulty since discharge with cough, minimally productive , weakness , fatigue, fever tmax 102. Was called in Augmentin for 7 days , felt much better until off Augmentin. Fever and energy level improved but returned 2 days ago.  Appetite has been fair with no nausea vomiting diarrhea.  No hemoptysis no chest pain no calf pain.  No increased edema or orthopnea. Patient was discharged on oxygen is on 2 L.  No Known Allergies  Immunization History  Administered Date(s) Administered  . Influenza, High Dose Seasonal PF 12/06/2016, 12/22/2017  . Influenza-Unspecified 12/21/2017, 01/15/2019  . PFIZER SARS-COV-2 Vaccination 04/29/2019, 05/20/2019  . Pneumococcal Conjugate-13 02/28/2014  . Pneumococcal Polysaccharide-23 12/06/2016  . Td 12/29/2017  . Tdap 02/09/2007    Past Medical History:  Diagnosis Date  . Arthritis   . Cataract   . Chickenpox   . Chronic headaches   . Chronic kidney disease   . Chronic neck and back pain   . History of kidney stones   . Hyperlipidemia   . Hypertension   . Insomnia   . Neuromuscular disorder ([place])   . Prostate cancer ([place])  Tobacco History: Social History   Tobacco Use  Smoking Status Former Smoker  . Packs/day: 0.30  . Years: 11.00  .  Pack years: 3.30  Smokeless Tobacco Never Used  Tobacco Comment   quit 35-40 years ago per pt   Counseling given: Not Answered Comment: quit 35-40 years ago per pt   Outpatient Medications Prior to Visit  Medication Sig Dispense Refill  . albuterol (VENTOLIN HFA) 108 (90 Base) MCG/ACT inhaler Inhale 2 puffs into the lungs every 4 (four) hours as needed for wheezing or shortness of breath. 18 g 0  . amitriptyline (ELAVIL) 75 MG tablet Take 1 tablet (75 mg total) by mouth at bedtime. 90 tablet 3  . amLODipine (NORVASC) 10 MG tablet TAKE 1 TABLET BY MOUTH EVERY DAY (Patient taking differently: Take 10 mg by mouth daily. ) 90 tablet 2  . amoxicillin-clavulanate (AUGMENTIN) 875-125 MG tablet Take 1 tablet by mouth 2 (two) times daily. 14 tablet 0  . aspirin EC 81 MG tablet Take 81 mg by mouth daily.     Marland Kitchen atorvastatin (LIPITOR) 20 MG tablet TAKE 1 TABLET BY MOUTH EVERY DAY (Patient taking differently: Take 20 mg by mouth daily. ) 90 tablet 1  . LYRICA 100 MG capsule Take 100 mg by mouth 2 (two) times daily.   2  . Omega-3 Fatty Acids ([employer] FISH OIL) 1000 MG CAPS TAKE 1 CAPSULE BY MOUTH EVERY DAY WITH A MEAL (Patient taking differently: Take 1,000 mg by mouth daily. TAKE 1 CAPSULE BY MOUTH EVERY DAY WITH A MEAL) 90 capsule 3  . oxyCODONE-acetaminophen (PERCOCET) 10-325 MG per tablet Take 1 tablet by mouth as needed for pain (up to five times daily).     . prochlorperazine (COMPAZINE) 10 MG tablet Take 1 tablet (10 mg total) by mouth every 6 (six) hours as needed for nausea or vomiting. 30 tablet 0  . tiZANidine (ZANAFLEX) 4 MG tablet TAKE 1 TABLET BY MOUTH TWICE A DAY (Patient taking differently: Take 4 mg by mouth 2 (two) times daily. TAKE 1 TABLET BY MOUTH TWICE A DAY) 180 tablet 3  . zolpidem (AMBIEN) 10 MG tablet Take 1 tablet (10 mg total) by mouth at bedtime as needed. 30 tablet 4   No facility-administered medications prior to visit.     Review of Systems:   Constitutional:   No  weight  loss, night sweats, ++ Fevers, chills, fatigue, or  lassitude.  HEENT:   No headaches,  Difficulty swallowing,  Tooth/dental problems, or  Sore throat,                No sneezing, itching, ear ache, nasal congestion, post nasal drip,   CV:  No chest pain,  Orthopnea, PND, swelling in lower extremities, anasarca, dizziness, palpitations, syncope.   GI  No heartburn, indigestion, abdominal pain, nausea, vomiting, diarrhea, change in bowel habits, loss of appetite, bloody stools.   Resp:    No chest wall deformity  Skin: no rash or lesions.  GU: no dysuria, change in color of urine, no urgency or frequency.  No flank pain, no hematuria   MS:  No joint pain or swelling.  No decreased range of motion.  No back pain.    Physical Exam  BP (!) 140/72 (BP Location: Right Arm, Cuff Size: Normal)   Pulse (!) 118   Ht 5' 9"  (1.753 m)   Wt 198 lb (89.8 kg)   SpO2 93%   BMI 29.24 kg/m Temperature 98.3  GEN: A/Ox3; pleasant ,  NAD, appears weak, in wheelchair, on oxygen   HEENT:  [place]/AT,    NOSE-clear, THROAT-clear, no lesions, no postnasal drip or exudate noted.   NECK:  Supple w/ fair ROM; no JVD; normal carotid impulses w/o bruits; no thyromegaly or nodules palpated; no lymphadenopathy.    RESP decreased breath sounds in the bases no accessory muscle use, no dullness to percussion  CARD:  RRR, no m/r/g, no peripheral edema, pulses intact, no cyanosis or clubbing.  GI:   Soft & nt; nml bowel sounds; no organomegaly or masses detected.   Musco: Warm bil, no deformities or joint swelling noted.   Neuro: alert, no focal deficits noted.    Skin: Warm, no lesions or rashes    Lab Results:  CBC    Component Value Date/Time   WBC 18.2 (H) 09/14/2019 1517   WBC 15.9 (H) 09/09/2019 0500   RBC 4.12 (L) 09/14/2019 1517   HGB 11.0 (L) 09/14/2019 1517   HCT 34.0 (L) 09/14/2019 1517   PLT 546 (H) 09/14/2019 1517   MCV 82.5 09/14/2019 1517   MCH 26.7 09/14/2019 1517   MCHC 32.4  09/14/2019 1517   RDW 15.3 09/14/2019 1517   LYMPHSABS 0.9 09/14/2019 1517   MONOABS 1.0 09/14/2019 1517   EOSABS 0.0 09/14/2019 1517   BASOSABS 0.0 09/14/2019 1517    BMET    Component Value Date/Time   NA 138 09/14/2019 1517   K 4.7 09/14/2019 1517   CL 104 09/14/2019 1517   CO2 23 09/14/2019 1517   GLUCOSE 119 (H) 09/14/2019 1517   BUN 22 09/14/2019 1517   CREATININE 1.19 09/14/2019 1517   CREATININE 1.21 (H) 01/15/2019 1541   CALCIUM 10.0 09/14/2019 1517   GFRNONAA >60 09/14/2019 1517   GFRAA >60 09/14/2019 1517    BNP No results found for: BNP  ProBNP No results found for: PROBNP  Imaging: DG Chest 2 View  Result Date: 09/21/2019 CLINICAL DATA:  Fever.  Cough. EXAM: CHEST - 2 VIEW COMPARISON:  09/07/2019 FINDINGS: Midline trachea. Normal heart size and mediastinal contours. Atherosclerosis in the transverse aorta. No pleural effusion or pneumothorax. Slightly improved right lower lobe aeration. Possible decrease in right lower lobe lung mass and/or surrounding airspace disease. No new pulmonary opacity. Chronic interstitial thickening. IMPRESSION: Improved right lower lobe aeration since 09/07/2019. This is at the site of a right lower lobe lung mass detailed on PET. No evidence of acute superimposed pneumonia or explanation for patient's symptoms. Aortic Atherosclerosis (ICD10-I70.0). Electronically Signed   By: Abigail Miyamoto M.D.   On: 09/21/2019 09:37   DG Chest 2 View  Addendum Date: 08/25/2019   ADDENDUM REPORT: 08/25/2019 14:39 ADDENDUM: These results were called by telephone at the time of interpretation on 08/25/2019 at 2:39 pm to provider [facility] , who verbally acknowledged these results. Electronically Signed   By: Franchot Gallo M.D.   On: 08/25/2019 14:39   Result Date: 08/25/2019 CLINICAL DATA:  Cough and short of breath.  Fever 2 weeks. EXAM: CHEST - 2 VIEW COMPARISON:  11/30/2012 FINDINGS: Right middle lobe airspace density. The right lower  hilum appears enlarged. Prominent interstitial markings are present in the right lung base laterally. Findings suspicious for lung carcinoma possibly was pneumonia. Close follow-up warranted. Heart size normal. Negative for heart failure. Left lung clear. Small pleural effusion. IMPRESSION: Enlargement of the right hilum with right middle lobe mass/pneumonia. Findings suspicious for carcinoma lung. Given the patient's current symptoms in fever, follow-up chest x-ray recommended in 2-4 weeks.  CT chest with contrast may also be indicated . Electronically Signed: By: Franchot Gallo M.D. On: 08/25/2019 14:23   CT Chest W Contrast  Result Date: 09/02/2019 CLINICAL DATA:  Daily fevers. Shortness of breath x3 weeks. Abnormal chest x-ray. EXAM: CT CHEST WITH CONTRAST TECHNIQUE: Multidetector CT imaging of the chest was performed during intravenous contrast administration. CONTRAST:  80m OMNIPAQUE IOHEXOL 300 MG/ML  SOLN COMPARISON:  Chest x-ray dated August 25, 2019 FINDINGS: Cardiovascular: There are atherosclerotic changes of the thoracic aorta without evidence for dissection or aneurysm. The main pulmonary artery is not significantly dilated. There is no large centrally located pulmonary embolism. Detection of smaller pulmonary emboli is severely limited by contrast bolus timing. There are coronary artery calcifications. There is a trace pericardial effusion. The heart size is normal. Mediastinum/Nodes: --there are enlarged mediastinal lymph nodes. For example there is a necrotic appearing subcarinal lymph node measuring 2.3 cm (axial series 2, image 83). Additional enlarged mediastinal lymph nodes are noted. --multiple pathologically enlarged, necrotic right hilar lymph nodes are noted. -- No axillary lymphadenopathy. -- No supraclavicular lymphadenopathy. -- Normal thyroid gland where visualized. -  Unremarkable esophagus. Lungs/Pleura: There is an ill-defined right hilar lung mass measuring approximately 5.1 x 3.9  cm (axial series 2, image 96). There may be postobstructive pneumonia in the right lower lobe. There is interlobular septal thickening involving the right lower lobe. There is atelectasis at the left lung base. Upper Abdomen: There is a left adrenal mass measuring approximately 2.3 cm (axial series 2, image 160). Multiple renal cysts are noted bilaterally. Musculoskeletal: No chest wall abnormality. No bony spinal canal stenosis. IMPRESSION: 1. Ill-defined right hilar lung mass measuring approximately 5.1 x 3.9 cm with multiple pathologically enlarged, necrotic appearing mediastinal and right hilar lymph nodes. 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 2. Indeterminate 2.3 cm left adrenal mass. While this may represent a benign adenoma, a metastatic lesion is not excluded. 3. Trace pericardial effusion. Aortic Atherosclerosis (ICD10-I70.0). Electronically Signed   By: CConstance HolsterM.D.   On: 09/02/2019 16:07   MR BRAIN W WO CONTRAST  Result Date: 09/09/2019 CLINICAL DATA:  Lung mass, staging EXAM: MRI HEAD WITHOUT AND WITH CONTRAST TECHNIQUE: Multiplanar, multiecho pulse sequences of the brain and surrounding structures were obtained without and with intravenous contrast. CONTRAST:  9.567mGADAVIST GADOBUTROL 1 MMOL/ML IV SOLN COMPARISON:  None. FINDINGS: Brain: There is no acute infarction or intracranial hemorrhage. There is a 5 mm dural-based enhancing lesion along right frontoparietal convexity (series 10, image 34). There is no parenchymal mass, mass effect, or edema. There is no hydrocephalus or extra-axial fluid collection. Ventricles and sulci are within normal limits in size and configuration. Vascular: Major vessel flow voids at the skull base are preserved. Skull and upper cervical spine: Normal marrow signal is preserved. Sinuses/Orbits: Minor mucosal thickening. Bilateral lens replacements. Other: Sella is unremarkable. Minor patchy mastoid fluid opacification. IMPRESSION: No evidence  of intracranial metastatic disease. Subcentimeter right frontoparietal convexity meningioma. Electronically Signed   By: PrMacy Mis.D.   On: 09/09/2019 12:58   NM PET Image Initial (PI) Skull Base To Thigh  Result Date: 09/17/2019 CLINICAL DATA:  Initial treatment strategy for non-small cell lung cancer in a patient with history of prostate cancer. EXAM: NUCLEAR MEDICINE PET SKULL BASE TO THIGH TECHNIQUE: 10.4 mCi F-18 FDG was injected intravenously. Full-ring PET imaging was performed from the skull base to thigh after the radiotracer. CT data was obtained and used for attenuation correction and anatomic localization. Fasting  blood glucose: 111 mg/dl COMPARISON:  CT chest September 02, 2019 FINDINGS: Mediastinal blood pool activity: SUV max 2.6 Liver activity: SUV max NA NECK: LEFT level 4 lymph node just above the thoracic inlet (image 42, series 4) 8 mm (SUVmax = 7.0) no additional hypermetabolic lymph nodes in the neck. Incidental CT findings: none CHEST: Hypermetabolic juxta hilar mass and nodal disease both in the RIGHT hilum, contralateral mediastinum and thoracic inlet, also associated with bony metastatic disease and adrenal metastases. Masslike area in the RIGHT chest with ill-defined margins in is nearly uniformly hypermetabolic (image 53, series 8) 7.9 x 4.4 cm (SUVmax = 13.3) Anterior mediastinal lymph node on the LEFT lateral aspect of the superior mediastinum (image 58, series 4) 1 cm (SUVmax = 19.3) Small lymph node along the LEFT lateral aspect of the spine in the posterior mediastinum adjacent to the aorta, behind heart (image 82, series 4) (SUVmax = 17.5) Similarly hypermetabolic lymph node in the subcarinal region (SUVmax = 16.5) 2.1 cm RIGHT paratracheal and 2 additional anterior mediastinal lymph nodes displaying hypermetabolic features with similar FDG uptake. At least 3 small lymph nodes along the RIGHT paratracheal chain display abnormal uptake. No contralateral hilar adenopathy no  internal mammary adenopathy Incidental CT findings: Atheromatous plaque of the thoracic aorta. Nonaneurysmal. No pericardial effusion. Background pulmonary emphysema. ABDOMEN/PELVIS: Bilateral adrenal lesions with increased metabolic activity, largest on the LEFT measuring 2.0 x 3.3 cm (SUVmax = 22. Smaller lesionw on the RIGHT associated with the body of the RIGHT adrenal gland measuring less than a cm (SUVmax = 6.8) no additional abnormal uptake within abdominal viscera.) Incidental CT findings: LEFT renal cysts. Aortic atherosclerosis without aneurysm. No adenopathy by size criteria in the abdomen or in the retroperitoneum. No pelvic lymphadenopathy. Post prostatectomy with signs of pelvic lymphadenectomy. SKELETON: Multifocal bony metastatic disease. RIGHT proximal humerus (image 36 of series 4 subtle abnormality in this location measuring approximately 1 cm (SUVmax = 10.3) LEFT second rib lesion (SUVmax = 3.6) Focal area of increased FDG uptake in C3 with lucent area (SUVmax = 4.9 another focus of increased uptake in the anterior aspect of T1 adjacent degenerative change is indeterminate.) LEFT pedicle uptake at T12 and posterior vertebral uptake at T1 suspicious for metastatic lesions. T12 uptake (SUVmax = 7.9) Intense FDG uptake in the anterior aspect of the L2 vertebral body, also involving L4, LEFT iliac crest, marrow space of the proximal LEFT femur and the posterior LEFT seventh rib. There is also subtle uptake in the anterior aspect of the RIGHT iliac crest. Also with subtle uptake noted in the RIGHT distal clavicle. Incidental CT findings: Spinal fusion in the cervical spine at C4-C5. IMPRESSION: Pulmonary neoplasm with nodal, visceral and bony metastases as described. Signs of prostatectomy with pelvic lymphadenectomy. Electronically Signed   By: Zetta Bills M.D.   On: 09/17/2019 16:54   DG Chest Port 1 View  Result Date: 09/07/2019 CLINICAL DATA:  Shortness of breath. EXAM: PORTABLE CHEST 1  VIEW COMPARISON:  09/03/2019 FINDINGS: The cardiac silhouette is normal in size. Right hilar enlargement and infrahilar density are unchanged and correspond to lymphadenopathy and an infrahilar mass on CT with unchanged airspace opacity more laterally in the right lower lobe. The left lung remains clear. No pleural effusion or pneumothorax is identified. Prior anterior cervical fusion is noted. IMPRESSION: Unchanged right hilar/infrahilar mass and lower lobe airspace opacity. Electronically Signed   By: Logan Bores M.D.   On: 09/07/2019 08:47   DG Chest [facility] 1 View  Result  Date: 09/03/2019 CLINICAL DATA:  Cough and fevers, EXAM: PORTABLE CHEST 1 VIEW COMPARISON:  CT from the previous day. FINDINGS: Cardiac shadow is within normal limits. Aortic calcifications are noted. The left lung is clear. Right hilar and infrahilar mass lesion is noted similar to that noted on prior CT examination. No other focal abnormality is seen. IMPRESSION: Stable right hilar and infrahilar mass lesion similar to that seen on prior CT from 1 day previous. Electronically Signed   By: Inez Catalina M.D.   On: 09/03/2019 02:26      No flowsheet data found.  No results found for: NITRICOXIDE      Assessment & Plan:   Postobstructive pneumonia Patient has known lung cancer with a postobstructive pneumonia.  He did have clinical improvement during hospitalization on antibiotics.  Chest x-ray today shows some improvement and decreased postobstructive consolidation with improved aeration however after he finished most recent antibiotics fever and general malaise are restarting.  Will rechallenge and treat with a full 10-day course of Augmentin.  Advised to take probiotics and use yogurt daily. We will have close follow-up in 2 weeks with a follow-up chest x-ray.   May use Ventolin as needed.  Add Mucinex.  Plan  Patient Instructions  Begin Augmentin 822m Twice daily  For 10 days. Take with food.  Begin Probiotic daily ,  eat yogurt daily .  Continue on Oxygen 2l/m.  May use Ventoliin Inhaler 1-2 puffs every 6hrs as needed, wheezing/shortness of breath.  Mucinex  Twice daily  As needed  Cough/congestion  Follow up in 2 weeks with Dr. HSilas Floodor APP and As needed   Please contact office for sooner follow up if symptoms do not improve or worsen or seek emergency care       Non-small cell carcinoma of right lung, stage 3 ([place] Newly diagnosed lung cancer with large lung mass and metastasis.  Patient has recently seen oncology.  According to notes may proceed with chemo and radiation.  Patient to follow-up with oncology as planned  Chronic respiratory failure with hypoxia ([place] Recently started on oxygen at discharge.  O2 saturations are adequate on 2 L.  No increased oxygen demands.     TRexene Edison NP 09/21/2019

## 2019-09-21 NOTE — Progress Notes (Signed)
Path dept requested icd code and stage for molecular studies. I updated them.

## 2019-09-21 NOTE — Patient Instructions (Addendum)
Begin Augmentin 875mg  Twice daily  For 10 days. Take with food.  Begin Probiotic daily , eat yogurt daily .  Continue on Oxygen 2l/m.  May use Ventoliin Inhaler 1-2 puffs every 6hrs as needed, wheezing/shortness of breath.  Mucinex  Twice daily  As needed  Cough/congestion  Follow up in 2 weeks with Dr. Silas Flood or APP and As needed   Please contact office for sooner follow up if symptoms do not improve or worsen or seek emergency care

## 2019-09-21 NOTE — Assessment & Plan Note (Signed)
Recently started on oxygen at discharge.  O2 saturations are adequate on 2 L.  No increased oxygen demands.

## 2019-09-21 NOTE — Assessment & Plan Note (Signed)
Newly diagnosed lung cancer with large lung mass and metastasis.  Patient has recently seen oncology.  According to notes may proceed with chemo and radiation.  Patient to follow-up with oncology as planned

## 2019-09-21 NOTE — Assessment & Plan Note (Signed)
Patient has known lung cancer with a postobstructive pneumonia.  He did have clinical improvement during hospitalization on antibiotics.  Chest x-ray today shows some improvement and decreased postobstructive consolidation with improved aeration however after he finished most recent antibiotics fever and general malaise are restarting.  Will rechallenge and treat with a full 10-day course of Augmentin.  Advised to take probiotics and use yogurt daily. We will have close follow-up in 2 weeks with a follow-up chest x-ray.   May use Ventolin as needed.  Add Mucinex.  Plan  Patient Instructions  Begin Augmentin 875mg  Twice daily  For 10 days. Take with food.  Begin Probiotic daily , eat yogurt daily .  Continue on Oxygen 2l/m.  May use Ventoliin Inhaler 1-2 puffs every 6hrs as needed, wheezing/shortness of breath.  Mucinex  Twice daily  As needed  Cough/congestion  Follow up in 2 weeks with Dr. Silas Flood or APP and As needed   Please contact office for sooner follow up if symptoms do not improve or worsen or seek emergency care

## 2019-09-23 ENCOUNTER — Telehealth: Payer: Self-pay | Admitting: Adult Health

## 2019-09-23 NOTE — Telephone Encounter (Signed)
Spoke with wife , patient feeling better yesterday with resolution of fever and increased energy after starting Augmentin.  This morning woke up with upper abdomen/stomach pains uncomfortable feeling.  No nausea vomiting or diarrhea.  Bowel movements have been normal.  No bloody stools.  No urinary symptoms.  No fever.  Appetite is fair ate yogurt without difficulty.  Patient was recently diagnosed metastatic lung cancer with a postobstructive pneumonia.  Recent hospitalization, high risk for decompensation. Have suggested that patient advance a clear to bland diet.  As tolerated.  Increase fluid intake.  May try Gas-X and or Pepto for stomach upset.  Advised to contact primary care provider immediately for evaluation .  If unable to get in with primary care will need to seek urgent care or emergency room care Unclear etiology of abdominal pain.  Could be secondary to antibiotic use however denies any nausea vomiting or diarrhea.  Please contact office for sooner follow up if symptoms do not improve or worsen or seek emergency care

## 2019-09-23 NOTE — Telephone Encounter (Signed)
Spoke with the pt's spouse  Pt seen by TP 09/21/19 She states pt woke up this am having pain in the upper part of his stomach  He describes pain as constant and sharp  Has not had any constipation, diarrhea, fever  He is taking his augmentin given at ov and eating yogurt daily with this  He states this is new and never had a pain like this before  Please advise thanks

## 2019-09-30 ENCOUNTER — Telehealth: Payer: Self-pay | Admitting: Internal Medicine

## 2019-09-30 ENCOUNTER — Telehealth: Payer: Self-pay | Admitting: Medical Oncology

## 2019-09-30 NOTE — Telephone Encounter (Signed)
Called and spoke with patients wife. Confirmed all appts

## 2019-09-30 NOTE — Telephone Encounter (Signed)
Per Seth Bake , nothing else is needed at this time for [facility].  It was resolved today.

## 2019-10-01 ENCOUNTER — Inpatient Hospital Stay: Payer: Medicare Other

## 2019-10-01 MED FILL — Dexamethasone Sodium Phosphate Inj 100 MG/10ML: INTRAMUSCULAR | Qty: 2 | Status: AC

## 2019-10-04 ENCOUNTER — Other Ambulatory Visit: Payer: Medicare Other

## 2019-10-04 ENCOUNTER — Ambulatory Visit: Payer: Medicare Other | Admitting: Internal Medicine

## 2019-10-04 ENCOUNTER — Other Ambulatory Visit: Payer: Self-pay

## 2019-10-04 ENCOUNTER — Inpatient Hospital Stay: Payer: Medicare Other | Attending: Internal Medicine | Admitting: Internal Medicine

## 2019-10-04 ENCOUNTER — Inpatient Hospital Stay: Payer: Medicare Other

## 2019-10-04 ENCOUNTER — Encounter: Payer: Self-pay | Admitting: Internal Medicine

## 2019-10-04 VITALS — BP 104/65 | HR 94 | Temp 98.9°F | Resp 19 | Ht 69.0 in | Wt 187.0 lb

## 2019-10-04 DIAGNOSIS — C7972 Secondary malignant neoplasm of left adrenal gland: Secondary | ICD-10-CM | POA: Diagnosis not present

## 2019-10-04 DIAGNOSIS — Z7189 Other specified counseling: Secondary | ICD-10-CM | POA: Diagnosis not present

## 2019-10-04 DIAGNOSIS — C7951 Secondary malignant neoplasm of bone: Secondary | ICD-10-CM | POA: Insufficient documentation

## 2019-10-04 DIAGNOSIS — Z5112 Encounter for antineoplastic immunotherapy: Secondary | ICD-10-CM | POA: Diagnosis not present

## 2019-10-04 DIAGNOSIS — C3491 Malignant neoplasm of unspecified part of right bronchus or lung: Secondary | ICD-10-CM | POA: Insufficient documentation

## 2019-10-04 DIAGNOSIS — C7971 Secondary malignant neoplasm of right adrenal gland: Secondary | ICD-10-CM | POA: Diagnosis not present

## 2019-10-04 DIAGNOSIS — Z79899 Other long term (current) drug therapy: Secondary | ICD-10-CM | POA: Insufficient documentation

## 2019-10-04 DIAGNOSIS — Z5111 Encounter for antineoplastic chemotherapy: Secondary | ICD-10-CM | POA: Insufficient documentation

## 2019-10-04 MED ORDER — FOLIC ACID 1 MG PO TABS: 1.0000 mg | ORAL_TABLET | Freq: Every day | ORAL | 4 refills | Status: AC

## 2019-10-04 MED ORDER — CYANOCOBALAMIN 1000 MCG/ML IJ SOLN
1000.0000 ug | Freq: Once | INTRAMUSCULAR | Status: AC
  Administered 2019-10-04: 1000 ug via INTRAMUSCULAR

## 2019-10-04 MED ORDER — CYANOCOBALAMIN 1000 MCG/ML IJ SOLN
INTRAMUSCULAR | Status: AC
  Filled 2019-10-04: qty 1

## 2019-10-04 NOTE — Progress Notes (Signed)
DISCONTINUE ON PATHWAY REGIMEN - Non-Small Cell Lung     Administer weekly:     Paclitaxel      Carboplatin   **Always confirm dose/schedule in your pharmacy ordering system**  REASON: Other Reason PRIOR TREATMENT: [id number]: Carboplatin AUC=2 + Paclitaxel 45 mg/m2 Weekly During Radiation TREATMENT RESPONSE: Unable to Evaluate  START OFF PATHWAY REGIMEN - Non-Small Cell Lung   [id number]:Pembrolizumab 200 mg  IV D1 + Pemetrexed 500 mg/m2 IV D1 + Carboplatin AUC=5 IV D1 q21 Days:   A cycle is every 21 days:     Pembrolizumab      Pemetrexed      Carboplatin   **Always confirm dose/schedule in your pharmacy ordering system**  Patient Characteristics: Stage IV Metastatic, Nonsquamous, Initial Chemotherapy/Immunotherapy, PS = 0, 1, ALK or EGFR or ROS1 or NTRK or MET or RET Genomic Alterations - Awaiting Test Results Therapeutic Status: Stage IV Metastatic Histology: Nonsquamous Cell ROS1 Rearrangement Status: Awaiting Test Results Other Mutations/Biomarkers: Awaiting Test Results Chemotherapy/Immunotherapy LOT: Initial Chemotherapy/Immunotherapy Molecular Targeted Therapy: Not Appropriate KRAS G12C Mutation Status: Awaiting Test Results MET Exon 14 Mutation Status: Awaiting Test Results RET Gene Fusion Status: Awaiting Test Results EGFR Mutation Status: Awaiting Test Results NTRK Gene Fusion Status: Awaiting Test Results PD-L1 Expression Status: Awaiting Test Results ALK Rearrangement Status: Awaiting Test Results BRAF V600E Mutation Status: Awaiting Test Results ECOG Performance Status: 1 Biomarker Assessment Status Confirmation: Awaiting genomic biomarker test(s) results and need to start chemotherapy Intent of Therapy: Non-Curative / Palliative Intent, Discussed with Patient

## 2019-10-04 NOTE — Progress Notes (Signed)
[facility] [phone]   [phone]  OFFICE PROGRESS NOTE  Pleas Koch, NP [place] [address]  DIAGNOSIS: Stage IV (T4, N3, M1 C) non-small cell lung cancer presented with large right hilar mass in addition to mediastinal lymphadenopathy with bilateral adrenal metastasis and bony metastatic disease diagnosed in July 2021.  PRIOR THERAPY: None.  CURRENT THERAPY: Systemic chemotherapy with carboplatin for AUC of 5, Alimta 500 mg/M2 and Keytruda 200 mg IV every 3 weeks.  First dose 10/13/2019.  INTERVAL HISTORY: Jerry Wong 74 y.o. male returns to the clinic today for follow-up visit accompanied by his wife.  The patient is feeling fine today with no concerning complaints except for the persistent fatigue and shortness of breath at baseline increased with exertion.  He is currently on home oxygen.  He has intermittent abdominal pain.  He denied having any nausea, vomiting, diarrhea or constipation.  He has no headache or visual changes.  He has no fever or chills.  The patient had several studies performed recently including a PET scan as well as MRI of the brain and he is here today for evaluation and discussion of his scan results and treatment options based on the new findings.  He also has molecular studies sent to [facility] but the results are still pending.  MEDICAL HISTORY: Past Medical History:  Diagnosis Date  . Arthritis   . Cataract   . Chickenpox   . Chronic headaches   . Chronic kidney disease   . Chronic neck and back pain   . History of kidney stones   . Hyperlipidemia   . Hypertension   . Insomnia   . Neuromuscular disorder ([place])   . Prostate cancer ([place])     ALLERGIES:  has No Known Allergies.  MEDICATIONS:  Current Outpatient Medications  Medication Sig Dispense Refill  . albuterol (VENTOLIN HFA) 108 (90 Base) MCG/ACT inhaler Inhale 2 puffs into the lungs every 4 (four) hours as needed for  wheezing or shortness of breath. 18 g 0  . amitriptyline (ELAVIL) 75 MG tablet Take 1 tablet (75 mg total) by mouth at bedtime. 90 tablet 3  . amLODipine (NORVASC) 10 MG tablet TAKE 1 TABLET BY MOUTH EVERY DAY (Patient taking differently: Take 10 mg by mouth daily. ) 90 tablet 2  . amoxicillin-clavulanate (AUGMENTIN) 875-125 MG tablet Take 1 tablet by mouth 2 (two) times daily. 14 tablet 0  . aspirin EC 81 MG tablet Take 81 mg by mouth daily.     Marland Kitchen atorvastatin (LIPITOR) 20 MG tablet TAKE 1 TABLET BY MOUTH EVERY DAY (Patient taking differently: Take 20 mg by mouth daily. ) 90 tablet 1  . LYRICA 100 MG capsule Take 100 mg by mouth 2 (two) times daily.   2  . Omega-3 Fatty Acids ([employer] FISH OIL) 1000 MG CAPS TAKE 1 CAPSULE BY MOUTH EVERY DAY WITH A MEAL (Patient taking differently: Take 1,000 mg by mouth daily. TAKE 1 CAPSULE BY MOUTH EVERY DAY WITH A MEAL) 90 capsule 3  . oxyCODONE-acetaminophen (PERCOCET) 10-325 MG per tablet Take 1 tablet by mouth as needed for pain (up to five times daily).     . prochlorperazine (COMPAZINE) 10 MG tablet Take 1 tablet (10 mg total) by mouth every 6 (six) hours as needed for nausea or vomiting. 30 tablet 0  . tiZANidine (ZANAFLEX) 4 MG tablet TAKE 1 TABLET BY MOUTH TWICE A DAY (Patient taking differently: Take  4 mg by mouth 2 (two) times daily. TAKE 1 TABLET BY MOUTH TWICE A DAY) 180 tablet 3  . zolpidem (AMBIEN) 10 MG tablet Take 1 tablet (10 mg total) by mouth at bedtime as needed. 30 tablet 4   No current facility-administered medications for this visit.    SURGICAL HISTORY:  Past Surgical History:  Procedure Laterality Date  . BACK SURGERY  1974  . BRONCHIAL NEEDLE ASPIRATION BIOPSY  09/06/2019   Procedure: BRONCHIAL NEEDLE ASPIRATION BIOPSIES;  Surgeon: Candee Furbish, MD;  Location: [facility] ENDOSCOPY;  Service: Pulmonary;;  . BRONCHIAL WASHINGS  09/06/2019   Procedure: BRONCHIAL WASHINGS;  Surgeon: Candee Furbish, MD;  Location: [facility] ENDOSCOPY;  Service:  Pulmonary;;  . NECK SURGERY  2002  . PROSTATECTOMY    . TONSILLECTOMY  1958  . TRIGGER FINGER RELEASE Left 11/20/2017   left thumb/Dr. Apolonio Schneiders  . VIDEO BRONCHOSCOPY WITH ENDOBRONCHIAL ULTRASOUND N/A 09/06/2019   Procedure: VIDEO BRONCHOSCOPY WITH ENDOBRONCHIAL ULTRASOUND;  Surgeon: Candee Furbish, MD;  Location: [facility] ENDOSCOPY;  Service: Pulmonary;  Laterality: N/A;    REVIEW OF SYSTEMS:  Constitutional: positive for fatigue Eyes: negative Ears, nose, mouth, throat, and face: negative Respiratory: positive for dyspnea on exertion Cardiovascular: negative Gastrointestinal: negative Genitourinary:negative Integument/breast: negative Hematologic/lymphatic: negative Musculoskeletal:negative Neurological: negative Behavioral/Psych: negative Endocrine: negative Allergic/Immunologic: negative   PHYSICAL EXAMINATION: General appearance: alert, cooperative, fatigued and no distress Head: Normocephalic, without obvious abnormality, atraumatic Neck: no adenopathy, no JVD, supple, symmetrical, trachea midline and thyroid not enlarged, symmetric, no tenderness/mass/nodules Lymph nodes: Cervical, supraclavicular, and axillary nodes normal. Resp: clear to auscultation bilaterally Back: symmetric, no curvature. ROM normal. No CVA tenderness. Cardio: regular rate and rhythm, S1, S2 normal, no murmur, click, rub or gallop GI: soft, non-tender; bowel sounds normal; no masses,  no organomegaly Extremities: extremities normal, atraumatic, no cyanosis or edema Neurologic: Alert and oriented X 3, normal strength and tone. Normal symmetric reflexes. Normal coordination and gait  ECOG PERFORMANCE STATUS: 1 - Symptomatic but completely ambulatory  Blood pressure 104/65, pulse 94, temperature 98.9 F (37.2 C), temperature source Tympanic, resp. rate 19, height 5\' 9"  (1.753 m), weight 187 lb (84.8 kg), SpO2 95 %.  LABORATORY DATA: Lab Results  Component Value Date   WBC 18.2 (H) 09/14/2019   HGB 11.0  (L) 09/14/2019   HCT 34.0 (L) 09/14/2019   MCV 82.5 09/14/2019   PLT 546 (H) 09/14/2019      Chemistry      Component Value Date/Time   NA 138 09/14/2019 1517   K 4.7 09/14/2019 1517   CL 104 09/14/2019 1517   CO2 23 09/14/2019 1517   BUN 22 09/14/2019 1517   CREATININE 1.19 09/14/2019 1517   CREATININE 1.21 (H) 01/15/2019 1541      Component Value Date/Time   CALCIUM 10.0 09/14/2019 1517   ALKPHOS 93 09/14/2019 1517   AST 15 09/14/2019 1517   ALT 27 09/14/2019 1517   BILITOT 0.3 09/14/2019 1517       RADIOGRAPHIC STUDIES: DG Chest 2 View  Result Date: 09/21/2019 CLINICAL DATA:  Fever.  Cough. EXAM: CHEST - 2 VIEW COMPARISON:  09/07/2019 FINDINGS: Midline trachea. Normal heart size and mediastinal contours. Atherosclerosis in the transverse aorta. No pleural effusion or pneumothorax. Slightly improved right lower lobe aeration. Possible decrease in right lower lobe lung mass and/or surrounding airspace disease. No new pulmonary opacity. Chronic interstitial thickening. IMPRESSION: Improved right lower lobe aeration since 09/07/2019. This is at the site of a right lower lobe lung  mass detailed on PET. No evidence of acute superimposed pneumonia or explanation for patient's symptoms. Aortic Atherosclerosis (ICD10-I70.0). Electronically Signed   By: Abigail Miyamoto M.D.   On: 09/21/2019 09:37   MR BRAIN W WO CONTRAST  Result Date: 09/09/2019 CLINICAL DATA:  Lung mass, staging EXAM: MRI HEAD WITHOUT AND WITH CONTRAST TECHNIQUE: Multiplanar, multiecho pulse sequences of the brain and surrounding structures were obtained without and with intravenous contrast. CONTRAST:  9.30mL GADAVIST GADOBUTROL 1 MMOL/ML IV SOLN COMPARISON:  None. FINDINGS: Brain: There is no acute infarction or intracranial hemorrhage. There is a 5 mm dural-based enhancing lesion along right frontoparietal convexity (series 10, image 34). There is no parenchymal mass, mass effect, or edema. There is no hydrocephalus or  extra-axial fluid collection. Ventricles and sulci are within normal limits in size and configuration. Vascular: Major vessel flow voids at the skull base are preserved. Skull and upper cervical spine: Normal marrow signal is preserved. Sinuses/Orbits: Minor mucosal thickening. Bilateral lens replacements. Other: Sella is unremarkable. Minor patchy mastoid fluid opacification. IMPRESSION: No evidence of intracranial metastatic disease. Subcentimeter right frontoparietal convexity meningioma. Electronically Signed   By: Macy Mis M.D.   On: 09/09/2019 12:58   NM PET Image Initial (PI) Skull Base To Thigh  Result Date: 09/17/2019 CLINICAL DATA:  Initial treatment strategy for non-small cell lung cancer in a patient with history of prostate cancer. EXAM: NUCLEAR MEDICINE PET SKULL BASE TO THIGH TECHNIQUE: 10.4 mCi F-18 FDG was injected intravenously. Full-ring PET imaging was performed from the skull base to thigh after the radiotracer. CT data was obtained and used for attenuation correction and anatomic localization. Fasting blood glucose: 111 mg/dl COMPARISON:  CT chest September 02, 2019 FINDINGS: Mediastinal blood pool activity: SUV max 2.6 Liver activity: SUV max NA NECK: LEFT level 4 lymph node just above the thoracic inlet (image 42, series 4) 8 mm (SUVmax = 7.0) no additional hypermetabolic lymph nodes in the neck. Incidental CT findings: none CHEST: Hypermetabolic juxta hilar mass and nodal disease both in the RIGHT hilum, contralateral mediastinum and thoracic inlet, also associated with bony metastatic disease and adrenal metastases. Masslike area in the RIGHT chest with ill-defined margins in is nearly uniformly hypermetabolic (image 53, series 8) 7.9 x 4.4 cm (SUVmax = 13.3) Anterior mediastinal lymph node on the LEFT lateral aspect of the superior mediastinum (image 58, series 4) 1 cm (SUVmax = 19.3) Small lymph node along the LEFT lateral aspect of the spine in the posterior mediastinum adjacent to  the aorta, behind heart (image 82, series 4) (SUVmax = 17.5) Similarly hypermetabolic lymph node in the subcarinal region (SUVmax = 16.5) 2.1 cm RIGHT paratracheal and 2 additional anterior mediastinal lymph nodes displaying hypermetabolic features with similar FDG uptake. At least 3 small lymph nodes along the RIGHT paratracheal chain display abnormal uptake. No contralateral hilar adenopathy no internal mammary adenopathy Incidental CT findings: Atheromatous plaque of the thoracic aorta. Nonaneurysmal. No pericardial effusion. Background pulmonary emphysema. ABDOMEN/PELVIS: Bilateral adrenal lesions with increased metabolic activity, largest on the LEFT measuring 2.0 x 3.3 cm (SUVmax = 22. Smaller lesionw on the RIGHT associated with the body of the RIGHT adrenal gland measuring less than a cm (SUVmax = 6.8) no additional abnormal uptake within abdominal viscera.) Incidental CT findings: LEFT renal cysts. Aortic atherosclerosis without aneurysm. No adenopathy by size criteria in the abdomen or in the retroperitoneum. No pelvic lymphadenopathy. Post prostatectomy with signs of pelvic lymphadenectomy. SKELETON: Multifocal bony metastatic disease. RIGHT proximal humerus (image 36 of series  4 subtle abnormality in this location measuring approximately 1 cm (SUVmax = 10.3) LEFT second rib lesion (SUVmax = 3.6) Focal area of increased FDG uptake in C3 with lucent area (SUVmax = 4.9 another focus of increased uptake in the anterior aspect of T1 adjacent degenerative change is indeterminate.) LEFT pedicle uptake at T12 and posterior vertebral uptake at T1 suspicious for metastatic lesions. T12 uptake (SUVmax = 7.9) Intense FDG uptake in the anterior aspect of the L2 vertebral body, also involving L4, LEFT iliac crest, marrow space of the proximal LEFT femur and the posterior LEFT seventh rib. There is also subtle uptake in the anterior aspect of the RIGHT iliac crest. Also with subtle uptake noted in the RIGHT distal  clavicle. Incidental CT findings: Spinal fusion in the cervical spine at C4-C5. IMPRESSION: Pulmonary neoplasm with nodal, visceral and bony metastases as described. Signs of prostatectomy with pelvic lymphadenectomy. Electronically Signed   By: Zetta Bills M.D.   On: 09/17/2019 16:54   DG Chest Port 1 View  Result Date: 09/07/2019 CLINICAL DATA:  Shortness of breath. EXAM: PORTABLE CHEST 1 VIEW COMPARISON:  09/03/2019 FINDINGS: The cardiac silhouette is normal in size. Right hilar enlargement and infrahilar density are unchanged and correspond to lymphadenopathy and an infrahilar mass on CT with unchanged airspace opacity more laterally in the right lower lobe. The left lung remains clear. No pleural effusion or pneumothorax is identified. Prior anterior cervical fusion is noted. IMPRESSION: Unchanged right hilar/infrahilar mass and lower lobe airspace opacity. Electronically Signed   By: Logan Bores M.D.   On: 09/07/2019 08:47    ASSESSMENT AND PLAN: This is a very pleasant 74 years old white male with a stage IV non-small cell lung cancer with large right hilar mass in addition to mediastinal and supraclavicular lymphadenopathy and metastatic disease to the bone and adrenal glands diagnosed in July 2021. I had a lengthy discussion with the patient and his wife today about his current condition and treatment options. I personally and independently reviewed the scan images and discussed the result and showed the images to the patient and his wife. I explained to the patient that he has incurable condition and all the treatment will be of palliative nature. His molecular studies are still pending and expected to be available on 10/12/2019. I gave the patient the option of palliative care versus palliative systemic chemotherapy with carboplatin for AUC of 5, Alimta 500 mg/M2 and Keytruda 200 mg IV every 3 weeks if the molecular studies are negative for actionable mutations. He is expected to start the  first cycle of this treatment on 10/13/2019. I will arrange for the patient to receive vitamin B12 injection today. I will send the prescription for folic acid 1 mg p.o. daily to his pharmacy. The patient will come back for follow-up visit in 1 week with the start of the first cycle of his treatment. He will see radiation oncology for a short course of palliative radiotherapy to the right hilar mass and any painful bone disease. The patient was advised to call immediately if he has any other concerning symptoms in the interval. The patient voices understanding of current disease status and treatment options and is in agreement with the current care plan.  All questions were answered. The patient knows to call the clinic with any problems, questions or concerns. We can certainly see the patient much sooner if necessary.  The total time spent in the appointment was 40 minutes.  Disclaimer: This note was dictated with  voice recognition software. Similar sounding words can inadvertently be transcribed and may not be corrected upon review.

## 2019-10-04 NOTE — Progress Notes (Signed)
Radiation Oncology         [phone]) [phone] ________________________________  Initial Outpatient Consultation  Name: Jerry Wong MRN: [id number]  Date: 10/06/2019  DOB: May 04, 1945  SW:NIOEV, Jerry Penna, NP  Curt Bears, MD   REFERRING PHYSICIAN: Curt Bears, MD  DIAGNOSIS: The encounter diagnosis was Non-small cell carcinoma of right lung, stage 4 ([place]).  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 male who is seen as a courtesy of Dr. Julien Nordmann for an opinion concerning radiation therapy as part of management for his recently diagnosed lung cancer. Today, he is accompanied by wife. The patient presented to the [facility] [place] on 08/25/2019 with complaints of cough and fever. Chest x-ray at that time showed enlargement of the right hilum with right middle lobe mass/pneumonia. Findings were suspicious for lung carcinoma. Follow-up chest CT scan was ordered, Levaquin and Prednisone were prescribed, and the patient was discharged home with close PCP follow-up.  Chest CT scan on 09/02/2019 showed an ill-defined right hilar lung mass that measured approximately 5.1 x 3.9 cm with multiple pathologically enlarged, necrotic appearing mediastinal and right hilar lymph nodes. Findings were concerning for bronchogenic carcinoma. There was also noted to be an indeterminate 2.3 cm left adrenal mass, which may represent a benign adenoma but a metastatic lesion could not be excluded. Finally, there was some trace pericardial effusion.  The patient was referred to the ED by Alma Friendly, NP, on the following day, 09/03/2019, for IV antibiotics. Chest x-ray on that day showed a stable right hilar and infrahilar mass lesion similar to that seen on the above CT scan. He was found to be septic and was admitted to the hospital for further management.  The patient was  evaluated by Eric Form, critical care NP, who recommended proceeding with tissue sampling either by bronchoscopy or adrenal gland biopsy to determine if it was primary bronchogenic versus prostate metastasis.  The patient underwent a flexible bronchoscopy with EBUS bronchoscopy on 09/06/2019 that was performed by Dr. Ina Homes. Cytology from the procedure revealed malignant cells in the fine needle aspiration of lymph node 7 and was most consistent with non-small cell carcinoma.  MRI of brain was performed on 09/09/2019 for staging work-up. Results showed a sub-centimeter right frontoparietal convexity meningioma without evidence of intracranial metastatic disease. The patient was stabilized and was discharged home later that day.  The patient was referred to Dr. Julien Nordmann and was seen in consultation on 09/14/2019. At that time, it was recommended that he proceed with PET scan to complete staging work-up. If there was no evidence of metastatic disease, then they discussed a course of concurrent chemoradiation with weekly Carboplatin and Paclitaxel. If the PET scan showed any metastatic disease, then the treatment plan would change. Additionally, a tissue block was sent for molecular studies to look for actionable mutations.   PET scan on 09/17/2019 showed pulmonary neoplasm with nodal, visceral and bony metastases. There were also signs of prostatectomy with pelvic lymphadenopathy. Of note, the patient has a history prostate cancer in 1999 that was treated with surgical resection followed by hormonal therapy.  Given the above findings, Dr. Julien Nordmann recommended that the patient proceed with systemic chemotherapy with Carboplatin, Alimta, and Keytruda to begin on 10/13/2019. Molecular studies are still pending. He as also referred to radiation oncology for consideration of palliative radiotherapy to the right hilar mass and any painful  bone disease.  PREVIOUS RADIATION THERAPY: No  PAST MEDICAL  HISTORY:  Past Medical History:  Diagnosis Date   Arthritis    Cataract    Chickenpox    Chronic headaches    Chronic kidney disease    Chronic neck and back pain    History of kidney stones    Hyperlipidemia    Hypertension    Insomnia    Neuromuscular disorder ([place])    Prostate cancer ([place])     PAST SURGICAL HISTORY: Past Surgical History:  Procedure Laterality Date   BACK SURGERY  1974   BRONCHIAL NEEDLE ASPIRATION BIOPSY  09/06/2019   Procedure: BRONCHIAL NEEDLE ASPIRATION BIOPSIES;  Surgeon: Candee Furbish, MD;  Location: [facility] ENDOSCOPY;  Service: Pulmonary;;   BRONCHIAL WASHINGS  09/06/2019   Procedure: BRONCHIAL WASHINGS;  Surgeon: Candee Furbish, MD;  Location: [facility] ENDOSCOPY;  Service: Pulmonary;;   NECK SURGERY  2002   PROSTATECTOMY     TONSILLECTOMY  1958   TRIGGER FINGER RELEASE Left 11/20/2017   left thumb/Dr. Apolonio Schneiders   VIDEO BRONCHOSCOPY WITH ENDOBRONCHIAL ULTRASOUND N/A 09/06/2019   Procedure: VIDEO BRONCHOSCOPY WITH ENDOBRONCHIAL ULTRASOUND;  Surgeon: Candee Furbish, MD;  Location: [facility] ENDOSCOPY;  Service: Pulmonary;  Laterality: N/A;    FAMILY HISTORY:  Family History  Problem Relation Age of Onset   Arthritis Mother    Lung cancer Mother    Arthritis Father    Lung cancer Father    Hyperlipidemia Father    Hypertension Father    Stroke Father    Prostate cancer Brother    Stomach cancer Maternal Grandmother    Colon cancer Neg Hx    Esophageal cancer Neg Hx    Pancreatic cancer Neg Hx    Rectal cancer Neg Hx     SOCIAL HISTORY:  Social History   Tobacco Use   Smoking status: Former Smoker    Packs/day: 0.30    Years: 11.00    Pack years: 3.30   Smokeless tobacco: Never Used   Tobacco comment: quit 35-40 years ago per pt  Vaping Use   Vaping Use: Never used  Substance Use Topics   Alcohol use: Not Currently   Drug use: No    ALLERGIES: No Known Allergies  MEDICATIONS:  Current Outpatient  Medications  Medication Sig Dispense Refill   albuterol (VENTOLIN HFA) 108 (90 Base) MCG/ACT inhaler Inhale 2 puffs into the lungs every 4 (four) hours as needed for wheezing or shortness of breath. 18 g 0   amitriptyline (ELAVIL) 75 MG tablet Take 1 tablet (75 mg total) by mouth at bedtime. 90 tablet 3   amLODipine (NORVASC) 10 MG tablet TAKE 1 TABLET BY MOUTH EVERY DAY (Patient taking differently: Take 10 mg by mouth daily. ) 90 tablet 2   amoxicillin-clavulanate (AUGMENTIN) 875-125 MG tablet Take 1 tablet by mouth 2 (two) times daily. 14 tablet 0   aspirin EC 81 MG tablet Take 81 mg by mouth daily.      atorvastatin (LIPITOR) 20 MG tablet TAKE 1 TABLET BY MOUTH EVERY DAY (Patient taking differently: Take 20 mg by mouth daily. ) 90 tablet 1   folic acid (FOLVITE) 1 MG tablet Take 1 tablet (1 mg total) by mouth daily. 30 tablet 4   LYRICA 100 MG capsule Take 100 mg by mouth 2 (two) times daily.   2   Omega-3 Fatty Acids ([employer] FISH OIL) 1000 MG CAPS TAKE 1 CAPSULE BY MOUTH EVERY DAY WITH A MEAL (Patient taking differently:  Take 1,000 mg by mouth daily. TAKE 1 CAPSULE BY MOUTH EVERY DAY WITH A MEAL) 90 capsule 3   oxyCODONE-acetaminophen (PERCOCET) 10-325 MG per tablet Take 1 tablet by mouth as needed for pain (up to five times daily).      prochlorperazine (COMPAZINE) 10 MG tablet Take 1 tablet (10 mg total) by mouth every 6 (six) hours as needed for nausea or vomiting. 30 tablet 0   tiZANidine (ZANAFLEX) 4 MG tablet TAKE 1 TABLET BY MOUTH TWICE A DAY (Patient taking differently: Take 4 mg by mouth 2 (two) times daily. TAKE 1 TABLET BY MOUTH TWICE A DAY) 180 tablet 3   zolpidem (AMBIEN) 10 MG tablet Take 1 tablet (10 mg total) by mouth at bedtime as needed. 30 tablet 4   No current facility-administered medications for this encounter.    REVIEW OF SYSTEMS:  A 10+ POINT REVIEW OF SYSTEMS WAS OBTAINED including neurology, dermatology, psychiatry, cardiac, respiratory, lymph, extremities,  GI, GU, musculoskeletal, constitutional, reproductive, HEENT.  Patient denies any further pain in the pelvis region.  He did report abdominal pain also but this is also resolved.   PHYSICAL EXAM:  height is _0  (1.753 m). His oral temperature is 98.8 F (37.1 C). His blood pressure is 111/65 and his pulse is 89. His respiration is 20 and oxygen saturation is 97%.   General: Alert and oriented, in no acute distress, remains in wheelchair evaluation.  Supplemental oxygen in place HEENT: Head is normocephalic. Extraocular movements are intact. Oropharynx is clear. Neck: Neck is supple, no palpable cervical or supraclavicular lymphadenopathy. Heart: Regular in rate and rhythm with no murmurs, rubs, or gallops. Chest:  decreased breath sounds in the right lung field abdomen: Soft, nontender, nondistended, with no rigidity or guarding. Extremities: No cyanosis or edema. Lymphatics: see Neck Exam Skin: No concerning lesions. Musculoskeletal: symmetric strength and muscle tone throughout. Neurologic: Cranial nerves II through XII are grossly intact. No obvious focalities. Speech is fluent. Coordination is intact. Psychiatric: Judgment and insight are intact. Affect is appropriate.   ECOG = 2  0 - Asymptomatic (Fully active, able to carry on all predisease activities without restriction)  1 - Symptomatic but completely ambulatory (Restricted in physically strenuous activity but ambulatory and able to carry out work of a light or sedentary nature. For example, light housework, office work)  2 - Symptomatic, <50% in bed during the day (Ambulatory and capable of all self care but unable to carry out any work activities. Up and about more than 50% of waking hours)  3 - Symptomatic, >50% in bed, but not bedbound (Capable of only limited self-care, confined to bed or chair 50% or more of waking hours)  4 - Bedbound (Completely disabled. Cannot carry on any self-care. Totally confined to bed or  chair)  5 - Death   Eustace Pen MM, Creech RH, Tormey DC, et al. [phone]). "Toxicity and response criteria of the [facility]". [place] Oncol. 5 (6): 649-55  LABORATORY DATA:  Lab Results  Component Value Date   WBC 18.2 (H) 09/14/2019   HGB 11.0 (L) 09/14/2019   HCT 34.0 (L) 09/14/2019   MCV 82.5 09/14/2019   PLT 546 (H) 09/14/2019   NEUTROABS 15.8 (H) 09/14/2019   Lab Results  Component Value Date   NA 138 09/14/2019   K 4.7 09/14/2019   CL 104 09/14/2019   CO2 23 09/14/2019   GLUCOSE 119 (H) 09/14/2019   CREATININE 1.19 09/14/2019   CALCIUM 10.0 09/14/2019  RADIOGRAPHY: DG Chest 2 View  Result Date: 09/21/2019 CLINICAL DATA:  Fever.  Cough. EXAM: CHEST - 2 VIEW COMPARISON:  09/07/2019 FINDINGS: Midline trachea. Normal heart size and mediastinal contours. Atherosclerosis in the transverse aorta. No pleural effusion or pneumothorax. Slightly improved right lower lobe aeration. Possible decrease in right lower lobe lung mass and/or surrounding airspace disease. No new pulmonary opacity. Chronic interstitial thickening. IMPRESSION: Improved right lower lobe aeration since 09/07/2019. This is at the site of a right lower lobe lung mass detailed on PET. No evidence of acute superimposed pneumonia or explanation for patient's symptoms. Aortic Atherosclerosis (ICD10-I70.0). Electronically Signed   By: Abigail Miyamoto M.D.   On: 09/21/2019 09:37   MR BRAIN W WO CONTRAST  Result Date: 09/09/2019 CLINICAL DATA:  Lung mass, staging EXAM: MRI HEAD WITHOUT AND WITH CONTRAST TECHNIQUE: Multiplanar, multiecho pulse sequences of the brain and surrounding structures were obtained without and with intravenous contrast. CONTRAST:  9.65m GADAVIST GADOBUTROL 1 MMOL/ML IV SOLN COMPARISON:  None. FINDINGS: Brain: There is no acute infarction or intracranial hemorrhage. There is a 5 mm dural-based enhancing lesion along right frontoparietal convexity (series 10, image 34). There is no  parenchymal mass, mass effect, or edema. There is no hydrocephalus or extra-axial fluid collection. Ventricles and sulci are within normal limits in size and configuration. Vascular: Major vessel flow voids at the skull base are preserved. Skull and upper cervical spine: Normal marrow signal is preserved. Sinuses/Orbits: Minor mucosal thickening. Bilateral lens replacements. Other: Sella is unremarkable. Minor patchy mastoid fluid opacification. IMPRESSION: No evidence of intracranial metastatic disease. Subcentimeter right frontoparietal convexity meningioma. Electronically Signed   By: PMacy MisM.D.   On: 09/09/2019 12:58   NM PET Image Initial (PI) Skull Base To Thigh  Result Date: 09/17/2019 CLINICAL DATA:  Initial treatment strategy for non-small cell lung cancer in a patient with history of prostate cancer. EXAM: NUCLEAR MEDICINE PET SKULL BASE TO THIGH TECHNIQUE: 10.4 mCi F-18 FDG was injected intravenously. Full-ring PET imaging was performed from the skull base to thigh after the radiotracer. CT data was obtained and used for attenuation correction and anatomic localization. Fasting blood glucose: 111 mg/dl COMPARISON:  CT chest September 02, 2019 FINDINGS: Mediastinal blood pool activity: SUV max 2.6 Liver activity: SUV max NA NECK: LEFT level 4 lymph node just above the thoracic inlet (image 42, series 4) 8 mm (SUVmax = 7.0) no additional hypermetabolic lymph nodes in the neck. Incidental CT findings: none CHEST: Hypermetabolic juxta hilar mass and nodal disease both in the RIGHT hilum, contralateral mediastinum and thoracic inlet, also associated with bony metastatic disease and adrenal metastases. Masslike area in the RIGHT chest with ill-defined margins in is nearly uniformly hypermetabolic (image 53, series 8) 7.9 x 4.4 cm (SUVmax = 13.3) Anterior mediastinal lymph node on the LEFT lateral aspect of the superior mediastinum (image 58, series 4) 1 cm (SUVmax = 19.3) Small lymph node along the LEFT  lateral aspect of the spine in the posterior mediastinum adjacent to the aorta, behind heart (image 82, series 4) (SUVmax = 17.5) Similarly hypermetabolic lymph node in the subcarinal region (SUVmax = 16.5) 2.1 cm RIGHT paratracheal and 2 additional anterior mediastinal lymph nodes displaying hypermetabolic features with similar FDG uptake. At least 3 small lymph nodes along the RIGHT paratracheal chain display abnormal uptake. No contralateral hilar adenopathy no internal mammary adenopathy Incidental CT findings: Atheromatous plaque of the thoracic aorta. Nonaneurysmal. No pericardial effusion. Background pulmonary emphysema. ABDOMEN/PELVIS: Bilateral adrenal lesions with increased metabolic  activity, largest on the LEFT measuring 2.0 x 3.3 cm (SUVmax = 22. Smaller lesionw on the RIGHT associated with the body of the RIGHT adrenal gland measuring less than a cm (SUVmax = 6.8) no additional abnormal uptake within abdominal viscera.) Incidental CT findings: LEFT renal cysts. Aortic atherosclerosis without aneurysm. No adenopathy by size criteria in the abdomen or in the retroperitoneum. No pelvic lymphadenopathy. Post prostatectomy with signs of pelvic lymphadenectomy. SKELETON: Multifocal bony metastatic disease. RIGHT proximal humerus (image 36 of series 4 subtle abnormality in this location measuring approximately 1 cm (SUVmax = 10.3) LEFT second rib lesion (SUVmax = 3.6) Focal area of increased FDG uptake in C3 with lucent area (SUVmax = 4.9 another focus of increased uptake in the anterior aspect of T1 adjacent degenerative change is indeterminate.) LEFT pedicle uptake at T12 and posterior vertebral uptake at T1 suspicious for metastatic lesions. T12 uptake (SUVmax = 7.9) Intense FDG uptake in the anterior aspect of the L2 vertebral body, also involving L4, LEFT iliac crest, marrow space of the proximal LEFT femur and the posterior LEFT seventh rib. There is also subtle uptake in the anterior aspect of the  RIGHT iliac crest. Also with subtle uptake noted in the RIGHT distal clavicle. Incidental CT findings: Spinal fusion in the cervical spine at C4-C5. IMPRESSION: Pulmonary neoplasm with nodal, visceral and bony metastases as described. Signs of prostatectomy with pelvic lymphadenectomy. Electronically Signed   By: Zetta Bills M.D.   On: 09/17/2019 16:54   DG Chest Port 1 View  Result Date: 09/07/2019 CLINICAL DATA:  Shortness of breath. EXAM: PORTABLE CHEST 1 VIEW COMPARISON:  09/03/2019 FINDINGS: The cardiac silhouette is normal in size. Right hilar enlargement and infrahilar density are unchanged and correspond to lymphadenopathy and an infrahilar mass on CT with unchanged airspace opacity more laterally in the right lower lobe. The left lung remains clear. No pleural effusion or pneumothorax is identified. Prior anterior cervical fusion is noted. IMPRESSION: Unchanged right hilar/infrahilar mass and lower lobe airspace opacity. Electronically Signed   By: Logan Bores M.D.   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  On PET scan this right lung mass/ collapse shows increased uptake consistent with tumor infiltration.  This certainly is affecting his overall breathing status.  the patient denies any further pain in the pelvis or abdominal area so we will hold off on palliative treatment for osseous metastasis.  Today, I talked to the patient and wife about the findings and work-up thus far.  We discussed the natural history of non-small cell lung cancer and general treatment, highlighting the role of radiotherapy in the management.  We discussed the available radiation techniques, and focused on the details of logistics and delivery.  We  reviewed the anticipated acute and late sequelae associated with radiation in this setting.  The patient was encouraged to ask questions that I answered to the best of my ability.  A patient consent form was discussed and signed.  We retained a copy for our records.  The patient would like to proceed with radiation and will be scheduled for CT simulation.  PLAN: Patient will return tomorrow for CT simulation with set up of radiation fields directed at the right lung mass and associated right  lung mass.  Anticipate 10-14 treatments  Total time spent in this encounter was 60 minutes which included reviewing the patient's most recent Urgent Care/ED visits, hospitalization, imaging (chest x-rays, chest CT scan, PET scan, MRI of brain), bronchoscopy, cytology report, consultations, follow-ups, treatment plan, physical examination, and documentation.   ------------------------------------------------  Blair Promise, PhD, MD  This document serves as a record of services personally performed by Gery Pray, MD. It was created on his behalf by Clerance Lav, a trained medical scribe. The creation of this record is based on the scribe's personal observations and the provider's statements to them. This document has been checked and approved by the attending provider.

## 2019-10-05 ENCOUNTER — Ambulatory Visit: Payer: Medicare Other

## 2019-10-06 ENCOUNTER — Telehealth: Payer: Self-pay | Admitting: Physician Assistant

## 2019-10-06 ENCOUNTER — Ambulatory Visit
Admission: RE | Admit: 2019-10-06 | Discharge: 2019-10-06 | Disposition: A | Discharge status: Home / Self Care | Payer: Medicare Other | Source: Ambulatory Visit | Attending: Radiation Oncology | Admitting: Radiation Oncology

## 2019-10-06 ENCOUNTER — Other Ambulatory Visit: Payer: Self-pay

## 2019-10-06 ENCOUNTER — Encounter: Payer: Self-pay | Admitting: General Practice

## 2019-10-06 ENCOUNTER — Encounter: Payer: Self-pay | Admitting: Radiation Oncology

## 2019-10-06 DIAGNOSIS — C3491 Malignant neoplasm of unspecified part of right bronchus or lung: Secondary | ICD-10-CM | POA: Diagnosis not present

## 2019-10-06 DIAGNOSIS — C342 Malignant neoplasm of middle lobe, bronchus or lung: Secondary | ICD-10-CM | POA: Diagnosis not present

## 2019-10-06 DIAGNOSIS — N189 Chronic kidney disease, unspecified: Secondary | ICD-10-CM | POA: Insufficient documentation

## 2019-10-06 DIAGNOSIS — E785 Hyperlipidemia, unspecified: Secondary | ICD-10-CM | POA: Diagnosis not present

## 2019-10-06 DIAGNOSIS — Z801 Family history of malignant neoplasm of trachea, bronchus and lung: Secondary | ICD-10-CM | POA: Insufficient documentation

## 2019-10-06 DIAGNOSIS — I313 Pericardial effusion (noninflammatory): Secondary | ICD-10-CM | POA: Diagnosis not present

## 2019-10-06 DIAGNOSIS — Z7982 Long term (current) use of aspirin: Secondary | ICD-10-CM | POA: Insufficient documentation

## 2019-10-06 DIAGNOSIS — Z87891 Personal history of nicotine dependence: Secondary | ICD-10-CM | POA: Diagnosis not present

## 2019-10-06 DIAGNOSIS — M129 Arthropathy, unspecified: Secondary | ICD-10-CM | POA: Diagnosis not present

## 2019-10-06 DIAGNOSIS — M545 Low back pain: Secondary | ICD-10-CM | POA: Diagnosis not present

## 2019-10-06 DIAGNOSIS — Z79899 Other long term (current) drug therapy: Secondary | ICD-10-CM | POA: Insufficient documentation

## 2019-10-06 DIAGNOSIS — I10 Essential (primary) hypertension: Secondary | ICD-10-CM | POA: Insufficient documentation

## 2019-10-06 DIAGNOSIS — M542 Cervicalgia: Secondary | ICD-10-CM | POA: Insufficient documentation

## 2019-10-06 DIAGNOSIS — Z87442 Personal history of urinary calculi: Secondary | ICD-10-CM | POA: Diagnosis not present

## 2019-10-06 DIAGNOSIS — C7971 Secondary malignant neoplasm of right adrenal gland: Secondary | ICD-10-CM | POA: Diagnosis not present

## 2019-10-06 DIAGNOSIS — C7951 Secondary malignant neoplasm of bone: Secondary | ICD-10-CM | POA: Diagnosis not present

## 2019-10-06 DIAGNOSIS — G4700 Insomnia, unspecified: Secondary | ICD-10-CM | POA: Diagnosis not present

## 2019-10-06 DIAGNOSIS — C7972 Secondary malignant neoplasm of left adrenal gland: Secondary | ICD-10-CM | POA: Diagnosis not present

## 2019-10-06 DIAGNOSIS — J439 Emphysema, unspecified: Secondary | ICD-10-CM | POA: Diagnosis not present

## 2019-10-06 DIAGNOSIS — C7931 Secondary malignant neoplasm of brain: Secondary | ICD-10-CM | POA: Insufficient documentation

## 2019-10-06 DIAGNOSIS — Z8546 Personal history of malignant neoplasm of prostate: Secondary | ICD-10-CM | POA: Diagnosis not present

## 2019-10-06 NOTE — Telephone Encounter (Signed)
Scheduled per los, patient has been called and voicemail was left. 

## 2019-10-06 NOTE — Progress Notes (Addendum)
Patient here today for a consult with Dr. Sondra Come.  : Stage IV (T4, N3, M1 C) non-small cell lung cancer presented with large right hilar mass in addition to mediastinal lymphadenopathy with bilateral adrenal metastasis and bony metastatic disease diagnosed in July 2021.  PRIOR THERAPY: None.  CURRENT THERAPY: Systemic chemotherapy with carboplatin for AUC of 5, Alimta 500 mg/M2 and Keytruda 200 mg IV every 3 weeks.  First dose 10/13/2019.  INTERVAL HISTORY: Jerry Wong 74 y.o. male returns to the clinic today for follow-up visit accompanied by his wife.  The patient is feeling fine today with no concerning complaints except for the persistent fatigue and shortness of breath at baseline increased with exertion.  He is currently on home oxygen.  He has intermittent abdominal pain.  He denied having any nausea, vomiting, diarrhea or constipation.  He has no headache or visual changes.  He has no fever or chills.  The patient had several studies performed recently including a PET scan as well as MRI of the brain and he is here today for evaluation and discussion of his scan results and treatment options based on the new findings.  He also has molecular [facility] but the results are still pending.  MEDICAL HISTORY:     Past Medical History:  Diagnosis Date  . Arthritis   . Cataract   . Chickenpox   . Chronic headaches   . Chronic kidney disease   . Chronic neck and back pain   . History of kidney stones   . Hyperlipidemia   . Hypertension   . Insomnia   . Neuromuscular disorder ([place])   . Prostate cancer ([place])     ALLERGIES:  has No Known Allergies.   SURGICAL HISTORY:       Past Surgical History:  Procedure Laterality Date  . BACK SURGERY  1974  . BRONCHIAL NEEDLE ASPIRATION BIOPSY  09/06/2019   Procedure: BRONCHIAL NEEDLE ASPIRATION BIOPSIES;  Surgeon: Candee Furbish, MD;  Location: [facility] ENDOSCOPY;  Service: Pulmonary;;  . BRONCHIAL WASHINGS   09/06/2019   Procedure: BRONCHIAL WASHINGS;  Surgeon: Candee Furbish, MD;  Location: [facility] ENDOSCOPY;  Service: Pulmonary;;  . NECK SURGERY  2002  . PROSTATECTOMY    . TONSILLECTOMY  1958  . TRIGGER FINGER RELEASE Left 11/20/2017   left thumb/Dr. Apolonio Schneiders  . VIDEO BRONCHOSCOPY WITH ENDOBRONCHIAL ULTRASOUND N/A 09/06/2019   Procedure: VIDEO BRONCHOSCOPY WITH ENDOBRONCHIAL ULTRASOUND;  Surgeon: Candee Furbish, MD;  Location: [facility] ENDOSCOPY;  Service: Pulmonary;  Laterality: N/A;    Dr. Julien Nordmann 10/04/2019  Past/Anticipated interventions by medical oncology, if any: starts chemo next week  Signs/Symptoms  Weight changes, if any: lost 18 lbs in 3-4 weeks  Respiratory complaints, if any: On 2 liters of oxygen. Gets shortness of breath on exertion with O2. Has an infrequent cough.  Hemoptysis, if any: no  Pain issues, if any:  no  SAFETY ISSUES:  Prior radiation? Yes 20 years ago with Prostate cancer  Pacemaker/ICD? no  Possible current pregnancy? n/a  Is the patient on methotrexate? no  Current Complaints / other details:   Patient is accompanied by his wife.  BP 111/65 (BP Location: Left Arm, Patient Position: Sitting)   Pulse 89   Temp 98.8 F (37.1 C) (Oral)   Resp 20   Ht 5\' 9"  (1.753 m)   SpO2 97%   BMI 27.62 kg/m    Wt Readings from Last 3 Encounters:  10/04/19 187 lb (84.8 kg)  09/21/19  198 lb (89.8 kg)  09/14/19 203 lb 11.2 oz (92.4 kg)    Patient was in the hospital with pneumonia for 1 week in July.

## 2019-10-06 NOTE — Progress Notes (Signed)
[facility] Psychosocial Distress Screening Clinical Social Work  Clinical Social Work was referred by distress screening protocol.  The patient scored a 5 on the Psychosocial Distress Thermometer which indicates moderate distress. Clinical Social Worker did not call patient as referral states he "declines SW referral) to assess for distress and other psychosocial needs.   ONCBCN DISTRESS SCREENING 10/06/2019  Screening Type Initial Screening  Distress experienced in past week (1-10) 5  Referral to clinical social work No  Other declines swk referral    Clinical Social Worker follow up needed: No.  If yes, follow up plan:  Beverely Pace, [place], LCSW Clinical Social Worker Phone:  [phone]

## 2019-10-07 ENCOUNTER — Encounter: Payer: Self-pay | Admitting: Pulmonary Disease

## 2019-10-07 ENCOUNTER — Ambulatory Visit
Admission: RE | Admit: 2019-10-07 | Discharge: 2019-10-07 | Disposition: A | Discharge status: Home / Self Care | Payer: Medicare Other | Source: Ambulatory Visit | Attending: Radiation Oncology | Admitting: Radiation Oncology

## 2019-10-07 ENCOUNTER — Other Ambulatory Visit: Payer: Self-pay

## 2019-10-07 ENCOUNTER — Ambulatory Visit (INDEPENDENT_AMBULATORY_CARE_PROVIDER_SITE_OTHER): Payer: Medicare Other | Admitting: Pulmonary Disease

## 2019-10-07 VITALS — BP 106/58 | HR 89 | Ht 69.0 in

## 2019-10-07 DIAGNOSIS — C3491 Malignant neoplasm of unspecified part of right bronchus or lung: Secondary | ICD-10-CM | POA: Insufficient documentation

## 2019-10-07 DIAGNOSIS — J189 Pneumonia, unspecified organism: Secondary | ICD-10-CM | POA: Diagnosis not present

## 2019-10-07 DIAGNOSIS — Z510 Encounter for antineoplastic radiation therapy: Secondary | ICD-10-CM | POA: Diagnosis not present

## 2019-10-07 DIAGNOSIS — C342 Malignant neoplasm of middle lobe, bronchus or lung: Secondary | ICD-10-CM | POA: Diagnosis not present

## 2019-10-07 DIAGNOSIS — J9611 Chronic respiratory failure with hypoxia: Secondary | ICD-10-CM | POA: Diagnosis not present

## 2019-10-07 NOTE — Progress Notes (Signed)
Pharmacist Chemotherapy Monitoring - Initial Assessment    Anticipated start date: 10/13/2019   Regimen:  . Are orders appropriate based on the patient's diagnosis, regimen, and cycle? Yes . Does the plan date match the patient's scheduled date? Yes . Is the sequencing of drugs appropriate? Yes . Are the premedications appropriate for the patient's regimen? Yes . Prior Authorization for treatment is: Approved o If applicable, is the correct biosimilar selected based on the patient's insurance? not applicable  Organ Function and Labs: Marland Kitchen Are dose adjustments needed based on the patient's renal function, hepatic function, or hematologic function? Yes . Are appropriate labs ordered prior to the start of patient's treatment? Yes . Other organ system assessment, if indicated: N/A . The following baseline labs, if indicated, have been ordered: pembrolizumab: baseline TSH +/- T4  Dose Assessment: . Are the drug doses appropriate? Yes . Are the following correct: o Drug concentrations Yes o IV fluid compatible with drug Yes o Administration routes Yes o Timing of therapy Yes . If applicable, does the patient have documented access for treatment and/or plans for port-a-cath placement? yes . If applicable, have lifetime cumulative doses been properly documented and assessed? yes Lifetime Dose Tracking  No doses have been documented on this patient for the following tracked chemicals: Doxorubicin, Epirubicin, Idarubicin, Daunorubicin, Mitoxantrone, Bleomycin, Oxaliplatin, Carboplatin, Liposomal Doxorubicin  o   Toxicity Monitoring/Prevention: . The patient has the following take home antiemetics prescribed: Prochlorperazine . The patient has the following take home medications prescribed: B12 for pemetrexed and folic acid for pemetrexed . Medication allergies and previous infusion related reactions, if applicable, have been reviewed and addressed. Yes . The patient's current medication list has  been assessed for drug-drug interactions with their chemotherapy regimen. no significant drug-drug interactions were identified on review.  Order Review: . Are the treatment plan orders signed? Yes . Is the patient scheduled to see a provider prior to their treatment? No  I verify that I have reviewed each item in the above checklist and answered each question accordingly.  Nikesh Teschner D 10/07/2019 4:13 PM

## 2019-10-07 NOTE — Progress Notes (Signed)
Patient ID: Jerry Wong, male    DOB: 01-21-1946, 74 y.o.   MRN: [id number]  Chief Complaint  Patient presents with  . Follow-up    former Dr. Tamala Julian pt being treated for PNA, lung ca.      Referring provider: Pleas Koch, NP  HPI: 74 y.o. male former smoker (quit 1981) seen for pulmonary consult while hospitalized 09/06/2019 for lung mass. PET scan consistent with metastatic disease. patient underwent navigational bronchoscopy/EBUS that was positive for non-small cell lung carcinoma.    TEST/EVENTS :  CT chest 09/02/2019 showed ill-defined right hilar lung mass measuring 5.1 x 3.9 cm with multiple pathologically enlarged necrotic appearing mediastinal and right hilar lymph nodes. 2.3 left adrenal mass  MRI brain 09/09/2019 - for metastatic disease subcentimeter right meningioma  PET scan 0/99/8338 hypermetabolic hilar mass and nodal disease both in the right hilum contralateral mediastinum and thoracic inlet also associated bony metastatic disease in adrenal metastasis. Masslike area in the right chest with ill-defined margins 7.9 x 4.4 cm. SUV max 13.3. Mediastinal, subcarinal, right paratracheal and anterior mediastinal hypermetabolic lymph nodes. Bilateral adrenal lesions with increased metabolic activity SUV max 22. Multifocal bony metastatic disease, right proximal humerus, left second rib lesion focal uptake [place] 3, left T12 and T1., L2, L4, left iliac crest, proximal left femur and posterior left seventh rib and right iliac crest subtle uptake in the right distal clavicle.  09/06/19 Path: Lung  The final cytology ([id number]) showed malignant cells. Immunohistochemistry for MOC31 and CK7 is strong and diffusely positive. TTF-1 is positive in rare cells. CK5/6 is weakly positive in rare cells. CK20, Napsin-A, CDX-2, GATA-3, PAX 8, p40, PSA, Prostein, S-100, Melan-A and D2-40 are negative. The morphology is most consistent with non-small cell carcinoma; however, immunohistochemistry    fails to provide further/more definitive characterization.  The tissue block [facility] for molecular studies and PD-L1 expression.   10/07/19 - Patient returns today after completing course of Augmentin. After hospital d/c still with low grade fever and cough despite abx therapy. Course extended 10 days. Cough has improved. Fever gone. Says he is feeling better than he has in a few weeks. Still quite short of breath with minimal exertion. Notes he had been very active but has no energy since developing symptoms that led to lung cancer diagnosis. Notes poor appetite. Wife says he has eaten a bit more the last couple of days.    CT chest 09/02/19 reviewed with RLL mass and subsequent RLL anterior and lateral basilar atelectasis with distal GGOs on my interpretation.  No Known Allergies  Immunization History  Administered Date(s) Administered  . Influenza, High Dose Seasonal PF 12/06/2016, 12/22/2017  . Influenza-Unspecified 12/21/2017, 01/15/2019  . PFIZER SARS-COV-2 Vaccination 04/29/2019, 05/20/2019  . Pneumococcal Conjugate-13 02/28/2014  . Pneumococcal Polysaccharide-23 12/06/2016  . Td 12/29/2017  . Tdap 02/09/2007    Past Medical History:  Diagnosis Date  . Arthritis   . Cataract   . Chickenpox   . Chronic headaches   . Chronic kidney disease   . Chronic neck and back pain   . History of kidney stones   . Hyperlipidemia   . Hypertension   . Insomnia   . Neuromuscular disorder ([place])   . Prostate cancer ([place])     Tobacco History: Social History   Tobacco Use  Smoking Status Former Smoker  . Packs/day: 0.30  . Years: 11.00  . Pack years: 3.30  Smokeless Tobacco Never Used  Tobacco Comment  quit 35-40 years ago per pt   Counseling given: Not Answered Comment: quit 35-40 years ago per pt   Outpatient Medications Prior to Visit  Medication Sig Dispense Refill  . albuterol (VENTOLIN HFA) 108 (90 Base) MCG/ACT inhaler Inhale 2 puffs into the lungs  every 4 (four) hours as needed for wheezing or shortness of breath. 18 g 0  . amitriptyline (ELAVIL) 75 MG tablet Take 1 tablet (75 mg total) by mouth at bedtime. 90 tablet 3  . amLODipine (NORVASC) 10 MG tablet TAKE 1 TABLET BY MOUTH EVERY DAY (Patient taking differently: Take 10 mg by mouth daily. ) 90 tablet 2  . amoxicillin-clavulanate (AUGMENTIN) 875-125 MG tablet Take 1 tablet by mouth 2 (two) times daily. 14 tablet 0  . aspirin EC 81 MG tablet Take 81 mg by mouth daily.     Marland Kitchen atorvastatin (LIPITOR) 20 MG tablet TAKE 1 TABLET BY MOUTH EVERY DAY (Patient taking differently: Take 20 mg by mouth daily. ) 90 tablet 1  . folic acid (FOLVITE) 1 MG tablet Take 1 tablet (1 mg total) by mouth daily. 30 tablet 4  . LYRICA 100 MG capsule Take 100 mg by mouth 2 (two) times daily.   2  . Omega-3 Fatty Acids ([employer] FISH OIL) 1000 MG CAPS TAKE 1 CAPSULE BY MOUTH EVERY DAY WITH A MEAL (Patient taking differently: Take 1,000 mg by mouth daily. TAKE 1 CAPSULE BY MOUTH EVERY DAY WITH A MEAL) 90 capsule 3  . oxyCODONE-acetaminophen (PERCOCET) 10-325 MG per tablet Take 1 tablet by mouth as needed for pain (up to five times daily).     . prochlorperazine (COMPAZINE) 10 MG tablet Take 1 tablet (10 mg total) by mouth every 6 (six) hours as needed for nausea or vomiting. 30 tablet 0  . tiZANidine (ZANAFLEX) 4 MG tablet TAKE 1 TABLET BY MOUTH TWICE A DAY (Patient taking differently: Take 4 mg by mouth 2 (two) times daily. TAKE 1 TABLET BY MOUTH TWICE A DAY) 180 tablet 3  . zolpidem (AMBIEN) 10 MG tablet Take 1 tablet (10 mg total) by mouth at bedtime as needed. 30 tablet 4   No facility-administered medications prior to visit.     Review of Systems:   Constitutional:   No  weight loss, night sweats, ++ Fevers, chills, fatigue, or  lassitude.  HEENT:   No headaches,  Difficulty swallowing,  Tooth/dental problems, or  Sore throat,                No sneezing, itching, ear ache, nasal congestion, post nasal drip,    CV:  No chest pain,  Orthopnea, PND, swelling in lower extremities, anasarca, dizziness, palpitations, syncope.   GI  No heartburn, indigestion, abdominal pain, nausea, vomiting, diarrhea, change in bowel habits, loss of appetite, bloody stools.   Resp:    No chest wall deformity  Skin: no rash or lesions.  GU: no dysuria, change in color of urine, no urgency or frequency.  No flank pain, no hematuria   MS:  No joint pain or swelling.  No decreased range of motion.  No back pain.    Physical Exam  BP (!) 106/58 (BP Location: Left Arm, Cuff Size: Normal)   Pulse 89   Ht _0  (1.753 m) Comment: per pt, did not stand for height today  SpO2 93%   BMI 27.62 kg/m Temperature 98.3  GEN: NAD, appears weak, in wheelchair, on oxygen Eyes:  EOMI, no icterus NECK:  Supple w/ fair ROM;  no JVD;    RESP: decreased breath sounds in the bases no accessory muscle use, no dullness to percussion CARD:  RRR, no m/r/g, trace peripheral edema to mid shin bilaterallly, Abdomen/GI:   Soft & nt; nml bowel sounds [place]: Warm bil, no deformities or joint swelling noted.  Neuro: alert, no focal deficits noted.   Skin: Warm, no lesions or rashes    Lab Results: Reviewed and as per EMR CBC    Component Value Date/Time   WBC 18.2 (H) 09/14/2019 1517   WBC 15.9 (H) 09/09/2019 0500   RBC 4.12 (L) 09/14/2019 1517   HGB 11.0 (L) 09/14/2019 1517   HCT 34.0 (L) 09/14/2019 1517   PLT 546 (H) 09/14/2019 1517   MCV 82.5 09/14/2019 1517   MCH 26.7 09/14/2019 1517   MCHC 32.4 09/14/2019 1517   RDW 15.3 09/14/2019 1517   LYMPHSABS 0.9 09/14/2019 1517   MONOABS 1.0 09/14/2019 1517   EOSABS 0.0 09/14/2019 1517   BASOSABS 0.0 09/14/2019 1517    BMET    Component Value Date/Time   NA 138 09/14/2019 1517   K 4.7 09/14/2019 1517   CL 104 09/14/2019 1517   CO2 23 09/14/2019 1517   GLUCOSE 119 (H) 09/14/2019 1517   BUN 22 09/14/2019 1517   CREATININE 1.19 09/14/2019 1517   CREATININE 1.21 (H)  01/15/2019 1541   CALCIUM 10.0 09/14/2019 1517   GFRNONAA >60 09/14/2019 1517   GFRAA >60 09/14/2019 1517    BNP No results found for: BNP  ProBNP No results found for: PROBNP  Imaging: Reviewed DG Chest 2 View  Result Date: 09/21/2019 CLINICAL DATA:  Fever.  Cough. EXAM: CHEST - 2 VIEW COMPARISON:  09/07/2019 FINDINGS: Midline trachea. Normal heart size and mediastinal contours. Atherosclerosis in the transverse aorta. No pleural effusion or pneumothorax. Slightly improved right lower lobe aeration. Possible decrease in right lower lobe lung mass and/or surrounding airspace disease. No new pulmonary opacity. Chronic interstitial thickening. IMPRESSION: Improved right lower lobe aeration since 09/07/2019. This is at the site of a right lower lobe lung mass detailed on PET. No evidence of acute superimposed pneumonia or explanation for patient's symptoms. Aortic Atherosclerosis (ICD10-I70.0). Electronically Signed   By: Abigail Miyamoto M.D.   On: 09/21/2019 09:37   MR BRAIN W WO CONTRAST  Result Date: 09/09/2019 CLINICAL DATA:  Lung mass, staging EXAM: MRI HEAD WITHOUT AND WITH CONTRAST TECHNIQUE: Multiplanar, multiecho pulse sequences of the brain and surrounding structures were obtained without and with intravenous contrast. CONTRAST:  9.93m GADAVIST GADOBUTROL 1 MMOL/ML IV SOLN COMPARISON:  None. FINDINGS: Brain: There is no acute infarction or intracranial hemorrhage. There is a 5 mm dural-based enhancing lesion along right frontoparietal convexity (series 10, image 34). There is no parenchymal mass, mass effect, or edema. There is no hydrocephalus or extra-axial fluid collection. Ventricles and sulci are within normal limits in size and configuration. Vascular: Major vessel flow voids at the skull base are preserved. Skull and upper cervical spine: Normal marrow signal is preserved. Sinuses/Orbits: Minor mucosal thickening. Bilateral lens replacements. Other: Sella is unremarkable. Minor patchy  mastoid fluid opacification. IMPRESSION: No evidence of intracranial metastatic disease. Subcentimeter right frontoparietal convexity meningioma. Electronically Signed   By: PMacy MisM.D.   On: 09/09/2019 12:58   NM PET Image Initial (PI) Skull Base To Thigh  Result Date: 09/17/2019 CLINICAL DATA:  Initial treatment strategy for non-small cell lung cancer in a patient with history of prostate cancer. EXAM: NUCLEAR MEDICINE PET SKULL BASE TO  THIGH TECHNIQUE: 10.4 mCi F-18 FDG was injected intravenously. Full-ring PET imaging was performed from the skull base to thigh after the radiotracer. CT data was obtained and used for attenuation correction and anatomic localization. Fasting blood glucose: 111 mg/dl COMPARISON:  CT chest September 02, 2019 FINDINGS: Mediastinal blood pool activity: SUV max 2.6 Liver activity: SUV max NA NECK: LEFT level 4 lymph node just above the thoracic inlet (image 42, series 4) 8 mm (SUVmax = 7.0) no additional hypermetabolic lymph nodes in the neck. Incidental CT findings: none CHEST: Hypermetabolic juxta hilar mass and nodal disease both in the RIGHT hilum, contralateral mediastinum and thoracic inlet, also associated with bony metastatic disease and adrenal metastases. Masslike area in the RIGHT chest with ill-defined margins in is nearly uniformly hypermetabolic (image 53, series 8) 7.9 x 4.4 cm (SUVmax = 13.3) Anterior mediastinal lymph node on the LEFT lateral aspect of the superior mediastinum (image 58, series 4) 1 cm (SUVmax = 19.3) Small lymph node along the LEFT lateral aspect of the spine in the posterior mediastinum adjacent to the aorta, behind heart (image 82, series 4) (SUVmax = 17.5) Similarly hypermetabolic lymph node in the subcarinal region (SUVmax = 16.5) 2.1 cm RIGHT paratracheal and 2 additional anterior mediastinal lymph nodes displaying hypermetabolic features with similar FDG uptake. At least 3 small lymph nodes along the RIGHT paratracheal chain display  abnormal uptake. No contralateral hilar adenopathy no internal mammary adenopathy Incidental CT findings: Atheromatous plaque of the thoracic aorta. Nonaneurysmal. No pericardial effusion. Background pulmonary emphysema. ABDOMEN/PELVIS: Bilateral adrenal lesions with increased metabolic activity, largest on the LEFT measuring 2.0 x 3.3 cm (SUVmax = 22. Smaller lesionw on the RIGHT associated with the body of the RIGHT adrenal gland measuring less than a cm (SUVmax = 6.8) no additional abnormal uptake within abdominal viscera.) Incidental CT findings: LEFT renal cysts. Aortic atherosclerosis without aneurysm. No adenopathy by size criteria in the abdomen or in the retroperitoneum. No pelvic lymphadenopathy. Post prostatectomy with signs of pelvic lymphadenectomy. SKELETON: Multifocal bony metastatic disease. RIGHT proximal humerus (image 36 of series 4 subtle abnormality in this location measuring approximately 1 cm (SUVmax = 10.3) LEFT second rib lesion (SUVmax = 3.6) Focal area of increased FDG uptake in C3 with lucent area (SUVmax = 4.9 another focus of increased uptake in the anterior aspect of T1 adjacent degenerative change is indeterminate.) LEFT pedicle uptake at T12 and posterior vertebral uptake at T1 suspicious for metastatic lesions. T12 uptake (SUVmax = 7.9) Intense FDG uptake in the anterior aspect of the L2 vertebral body, also involving L4, LEFT iliac crest, marrow space of the proximal LEFT femur and the posterior LEFT seventh rib. There is also subtle uptake in the anterior aspect of the RIGHT iliac crest. Also with subtle uptake noted in the RIGHT distal clavicle. Incidental CT findings: Spinal fusion in the cervical spine at C4-C5. IMPRESSION: Pulmonary neoplasm with nodal, visceral and bony metastases as described. Signs of prostatectomy with pelvic lymphadenectomy. Electronically Signed   By: Zetta Bills M.D.   On: 09/17/2019 16:54    cyanocobalamin ((VITAMIN B-12)) injection 1,000 mcg     Date Action Dose Route User   10/04/2019 1042 Given  Intramuscular (Left Deltoid) Cates, Porsche L, LPN      No flowsheet data found.  No results found for: NITRICOXIDE      Assessment & Plan:   74 y.o. male former smoker (quit 1981) seen for pulmonary consult while hospitalized 09/06/2019 for lung mass. PET scan consistent with metastatic  disease. patient underwent navigational bronchoscopy/EBUS that was positive for non-small cell lung carcinoma.   Seen in follow up after prolonged course of antibiotics for post obstructive pneumonia. Symptoms have improved. Feels better overall. Still a bit weak. Poor appetite, no energy. DOE worsening, not improved with LABA. Meeting with rad onc today. Hopeful radiotherapy will palliate DOE and improve exertional capacity. Consider LABA or LAMA if DOE does not improve with palliative radiation.   Follow up as needed.    Lanier Clam, MD 10/07/2019   I spent a total of 55 minutes devoted to patient care including face to face visit, review of notes, labs, images etc and coordination of care on the same date as visit.

## 2019-10-07 NOTE — Addendum Note (Signed)
Addended by: Len Blalock on: 10/07/2019 03:19 PM   Modules accepted: Orders

## 2019-10-07 NOTE — Patient Instructions (Signed)
Nice to meet you. I am glad you feel a little bit better after the antibiotics.  Please call the office and let us know if we can help in any way. I want to see you if you develop new or worsening symptoms of cough, chest pain, shortness of breath.

## 2019-10-08 ENCOUNTER — Encounter (HOSPITAL_COMMUNITY): Payer: Self-pay | Admitting: Internal Medicine

## 2019-10-08 DIAGNOSIS — C3491 Malignant neoplasm of unspecified part of right bronchus or lung: Secondary | ICD-10-CM | POA: Diagnosis not present

## 2019-10-10 DIAGNOSIS — J9601 Acute respiratory failure with hypoxia: Secondary | ICD-10-CM | POA: Diagnosis not present

## 2019-10-11 ENCOUNTER — Other Ambulatory Visit: Payer: Self-pay | Admitting: Adult Health

## 2019-10-11 ENCOUNTER — Ambulatory Visit: Payer: Medicare Other

## 2019-10-11 ENCOUNTER — Other Ambulatory Visit: Payer: Medicare Other

## 2019-10-11 DIAGNOSIS — C3491 Malignant neoplasm of unspecified part of right bronchus or lung: Secondary | ICD-10-CM | POA: Diagnosis not present

## 2019-10-11 DIAGNOSIS — Z510 Encounter for antineoplastic radiation therapy: Secondary | ICD-10-CM | POA: Diagnosis not present

## 2019-10-11 DIAGNOSIS — C342 Malignant neoplasm of middle lobe, bronchus or lung: Secondary | ICD-10-CM | POA: Diagnosis not present

## 2019-10-12 ENCOUNTER — Other Ambulatory Visit: Payer: Self-pay | Admitting: Physician Assistant

## 2019-10-12 ENCOUNTER — Ambulatory Visit
Admission: RE | Admit: 2019-10-12 | Discharge: 2019-10-12 | Disposition: A | Discharge status: Home / Self Care | Payer: Medicare Other | Source: Ambulatory Visit | Attending: Radiation Oncology | Admitting: Radiation Oncology

## 2019-10-12 DIAGNOSIS — C3491 Malignant neoplasm of unspecified part of right bronchus or lung: Secondary | ICD-10-CM

## 2019-10-12 NOTE — Progress Notes (Signed)
[facility] OFFICE PROGRESS NOTE  Doreene Nest, NP [address] [place] [address]  DIAGNOSIS: Stage IV (T4, N3, M1 C) non-small cell lung cancer presented with large right hilar mass in addition to mediastinal lymphadenopathy with bilateral adrenal metastasis and bony metastatic disease diagnosed in July 2021.  Molecular Biomarkers:  Tumor Mutational Burden - 16 Muts/Mb Microsatellite status - MS-Stable Genomic Findings For a complete list of the genes assayed, please refer to the Appendix. BRAF L597R KEAP1 E444* MYC amplification STK11 Q137* RB1 loss exons 5-27 TP53 splice site 75-1G>C 7 Disease relevant genes with no reportable alterations: ALK, EGFR, ERBB2, KRAS, MET, RET, ROS1  PDL1 Expression: 0%.  PRIOR THERAPY: None  CURRENT THERAPY:  1) KEAPSAKE trial: systemic chemotherapy with carboplatin for an AUC of 5, Alimta 500 mg per metered squared, Keytruda 200 mg IV every 3 weeks with Telaglenastat once it is determined if the patient is eligible.  2) palliative radiotherapy to the right hilar mass under the care of Dr. Roselind Messier on hold until establish eligibility for [facility] trial.   INTERVAL HISTORY: Ester Mabe 74 y.o. male returns to the clinic today for a follow-up visit.  The patient is feeling fair today without any concerning complaints except for continued fatigue and shortness of breath at baseline. He is still having fevers every morning. He was recently treated for pneumonia in June 2021. He was treated with Levaquin, Augmentin, and doxycycline recently. He reports some chills. Denies night sweats. Reports baseline shortness of breath. He reports a mild cough that is non-productive. He is currently on home oxygen via nasal cannula. He denies recent prednisone use. His last course of prednisone was several years ago. He has been vaccinated for COVID-19.  The patient has not been around anyone sick recently.  The patient denies history of anemia  which was noted on routine labs. He denies any abnormal bleeding or bruising including epistaxis, gingival bleeding, melena, hematochezia, hemoptysis, or hematuria.   The patient was recently diagnosed with stage IV adenocarcinoma. He has a poor appetite. He has not used any supplemental drinks.  Denies any chest pain or hemoptysis.  He denies any nausea, vomiting, diarrhea, or constipation. He denies lasix use. He denies using his albuterol inhaler frequently. He denies any visual changes. He has a mild headache a few days ago.  The patient recently had molecular studies performed to see if he is a candidate for any actionable mutations.  The patient is here today for evaluation, to review his molecular studies results, and to proceed with cycle #1 of systemic chemotherapy if he does not have any actionable mutations.   MEDICAL HISTORY: Past Medical History:  Diagnosis Date  . Arthritis   . Cataract   . Chickenpox   . Chronic headaches   . Chronic kidney disease   . Chronic neck and back pain   . History of kidney stones   . Hyperlipidemia   . Hypertension   . Insomnia   . Neuromuscular disorder (HCC)   . Prostate cancer (HCC)     ALLERGIES:  has No Known Allergies.  MEDICATIONS:  Current Outpatient Medications  Medication Sig Dispense Refill  . albuterol (VENTOLIN HFA) 108 (90 Base) MCG/ACT inhaler Inhale 2 puffs into the lungs every 4 (four) hours as needed for wheezing or shortness of breath. 18 g 0  . amitriptyline (ELAVIL) 75 MG tablet Take 1 tablet (75 mg total) by mouth at bedtime. 90 tablet 3  . amLODipine (NORVASC)  10 MG tablet TAKE 1 TABLET BY MOUTH EVERY DAY (Patient taking differently: Take 10 mg by mouth daily. ) 90 tablet 2  . aspirin EC 81 MG tablet Take 81 mg by mouth daily.     Marland Kitchen atorvastatin (LIPITOR) 20 MG tablet TAKE 1 TABLET BY MOUTH EVERY DAY (Patient taking differently: Take 20 mg by mouth daily. ) 90 tablet 1  . folic acid (FOLVITE) 1 MG tablet Take 1 tablet  (1 mg total) by mouth daily. 30 tablet 4  . LYRICA 100 MG capsule Take 100 mg by mouth 2 (two) times daily.   2  . Omega-3 Fatty Acids ([employer] FISH OIL) 1000 MG CAPS TAKE 1 CAPSULE BY MOUTH EVERY DAY WITH A MEAL (Patient taking differently: Take 1,000 mg by mouth daily. TAKE 1 CAPSULE BY MOUTH EVERY DAY WITH A MEAL) 90 capsule 3  . oxyCODONE-acetaminophen (PERCOCET) 10-325 MG per tablet Take 1 tablet by mouth as needed for pain (up to five times daily).     . potassium chloride SA (KLOR-CON) 20 MEQ tablet Take 1 tablet (20 mEq total) by mouth 2 (two) times daily. 14 tablet 0  . prochlorperazine (COMPAZINE) 10 MG tablet Take 1 tablet (10 mg total) by mouth every 6 (six) hours as needed for nausea or vomiting. 30 tablet 0  . tiZANidine (ZANAFLEX) 4 MG tablet TAKE 1 TABLET BY MOUTH TWICE A DAY (Patient taking differently: Take 4 mg by mouth 2 (two) times daily. TAKE 1 TABLET BY MOUTH TWICE A DAY) 180 tablet 3  . zolpidem (AMBIEN) 10 MG tablet Take 1 tablet (10 mg total) by mouth at bedtime as needed. 30 tablet 4   No current facility-administered medications for this visit.   Facility-Administered Medications Ordered in Other Visits  Medication Dose Route Frequency Provider Last Rate Last Admin  . 0.9 % NaCl with KCl 20 mEq/ L  infusion   Intravenous Continuous Calaya Gildner L, PA-C 500 mL/hr at 10/13/19 1405 New Bag at 10/13/19 1405    SURGICAL HISTORY:  Past Surgical History:  Procedure Laterality Date  . BACK SURGERY  1974  . BRONCHIAL NEEDLE ASPIRATION BIOPSY  09/06/2019   Procedure: BRONCHIAL NEEDLE ASPIRATION BIOPSIES;  Surgeon: Candee Furbish, MD;  Location: [facility] ENDOSCOPY;  Service: Pulmonary;;  . BRONCHIAL WASHINGS  09/06/2019   Procedure: BRONCHIAL WASHINGS;  Surgeon: Candee Furbish, MD;  Location[facility] ENDOSCOPY;  Service: Pulmonary;;  . NECK SURGERY  2002  . PROSTATECTOMY    . TONSILLECTOMY  1958  . TRIGGER FINGER RELEASE Left 11/20/2017   left thumb/Dr. Apolonio Schneiders  . VIDEO  BRONCHOSCOPY WITH ENDOBRONCHIAL ULTRASOUND N/A 09/06/2019   Procedure: VIDEO BRONCHOSCOPY WITH ENDOBRONCHIAL ULTRASOUND;  Surgeon: Candee Furbish, MD;  Location: [facility] ENDOSCOPY;  Service: Pulmonary;  Laterality: N/A;    REVIEW OF SYSTEMS:   Review of Systems  Constitutional: Positive for fatigue, decreased appetite, and weight loss.  Negative for chills and fever.  HENT: Negative for mouth sores, nosebleeds, sore throat and trouble swallowing.   Eyes: Negative for eye problems and icterus.  Respiratory: Positive for dyspnea on exertion as well as a mild dry cough.  Negative for hemoptysis and wheezing.   Cardiovascular: Negative for chest pain and leg swelling.  Gastrointestinal: Negative for abdominal pain, constipation, diarrhea, nausea and vomiting.  Genitourinary: Negative for bladder incontinence, difficulty urinating, dysuria, frequency and hematuria.   Musculoskeletal: Negative for back pain, gait problem, neck pain and neck stiffness.  Skin: Negative for itching and rash.  Neurological: Negative  for dizziness, extremity weakness, gait problem, headaches (resolved), light-headedness and seizures.  Hematological: Negative for adenopathy. Does not bruise/bleed easily.  Psychiatric/Behavioral: Negative for confusion, depression and sleep disturbance. The patient is not nervous/anxious.     PHYSICAL EXAMINATION:  Blood pressure (!) 107/57, pulse 88, temperature 98.3 F (36.8 C), temperature source Tympanic, resp. rate 17, height '5\' 9"'$  (1.753 m), weight 185 lb 3.2 oz (84 kg), SpO2 95 %.  ECOG PERFORMANCE STATUS: 2 - Symptomatic, <50% confined to bed  Physical Exam  Constitutional: Oriented to person, place, and time and well-developed, well-nourished, and in no distress.  On oxygen via nasal cannula HENT:  Head: Normocephalic and atraumatic.  Mouth/Throat: Oropharynx is clear and moist. No oropharyngeal exudate.  Eyes: Conjunctivae are normal. Right eye exhibits no discharge. Left eye  exhibits no discharge. No scleral icterus.  Neck: Normal range of motion. Neck supple.  Cardiovascular: Normal rate, regular rhythm, normal heart sounds and intact distal pulses.   Pulmonary/Chest: Effort normal.  Decreased breath sounds in the right lower lung field. No respiratory distress. No wheezes. No rales.  On oxygen via nasal cannula Abdominal: Soft. Bowel sounds are normal. Exhibits no distension and no mass. There is no tenderness.  Musculoskeletal: Normal range of motion. Exhibits no edema.  Lymphadenopathy:    No cervical adenopathy.  Neurological: Alert and oriented to person, place, and time. Exhibits normal muscle tone.  The patient was examined in the wheelchair. Skin: Skin is warm and dry. No rash noted. Not diaphoretic. No erythema. No pallor.  Psychiatric: Mood, memory and judgment normal.  Vitals reviewed.  LABORATORY DATA: Lab Results  Component Value Date   WBC 17.5 (H) 10/13/2019   HGB 9.5 (L) 10/13/2019   HCT 29.8 (L) 10/13/2019   MCV 77.8 (L) 10/13/2019   PLT 474 (H) 10/13/2019      Chemistry      Component Value Date/Time   NA 138 10/13/2019 1119   K 2.7 (LL) 10/13/2019 1119   CL 98 10/13/2019 1119   CO2 26 10/13/2019 1119   BUN 28 (H) 10/13/2019 1119   CREATININE 1.18 10/13/2019 1119   CREATININE 1.21 (H) 01/15/2019 1541      Component Value Date/Time   CALCIUM 9.4 10/13/2019 1119   ALKPHOS 125 10/13/2019 1119   AST 38 10/13/2019 1119   ALT 38 10/13/2019 1119   BILITOT 0.5 10/13/2019 1119       RADIOGRAPHIC STUDIES:  DG Chest 2 View  Result Date: 09/21/2019 CLINICAL DATA:  Fever.  Cough. EXAM: CHEST - 2 VIEW COMPARISON:  09/07/2019 FINDINGS: Midline trachea. Normal heart size and mediastinal contours. Atherosclerosis in the transverse aorta. No pleural effusion or pneumothorax. Slightly improved right lower lobe aeration. Possible decrease in right lower lobe lung mass and/or surrounding airspace disease. No new pulmonary opacity. Chronic  interstitial thickening. IMPRESSION: Improved right lower lobe aeration since 09/07/2019. This is at the site of a right lower lobe lung mass detailed on PET. No evidence of acute superimposed pneumonia or explanation for patient's symptoms. Aortic Atherosclerosis (ICD10-I70.0). Electronically Signed   By: Abigail Miyamoto M.D.   On: 09/21/2019 09:37   NM PET Image Initial (PI) Skull Base To Thigh  Result Date: 09/17/2019 CLINICAL DATA:  Initial treatment strategy for non-small cell lung cancer in a patient with history of prostate cancer. EXAM: NUCLEAR MEDICINE PET SKULL BASE TO THIGH TECHNIQUE: 10.4 mCi F-18 FDG was injected intravenously. Full-ring PET imaging was performed from the skull base to thigh after the radiotracer. CT  data was obtained and used for attenuation correction and anatomic localization. Fasting blood glucose: 111 mg/dl COMPARISON:  CT chest September 02, 2019 FINDINGS: Mediastinal blood pool activity: SUV max 2.6 Liver activity: SUV max NA NECK: LEFT level 4 lymph node just above the thoracic inlet (image 42, series 4) 8 mm (SUVmax = 7.0) no additional hypermetabolic lymph nodes in the neck. Incidental CT findings: none CHEST: Hypermetabolic juxta hilar mass and nodal disease both in the RIGHT hilum, contralateral mediastinum and thoracic inlet, also associated with bony metastatic disease and adrenal metastases. Masslike area in the RIGHT chest with ill-defined margins in is nearly uniformly hypermetabolic (image 53, series 8) 7.9 x 4.4 cm (SUVmax = 13.3) Anterior mediastinal lymph node on the LEFT lateral aspect of the superior mediastinum (image 58, series 4) 1 cm (SUVmax = 19.3) Small lymph node along the LEFT lateral aspect of the spine in the posterior mediastinum adjacent to the aorta, behind heart (image 82, series 4) (SUVmax = 17.5) Similarly hypermetabolic lymph node in the subcarinal region (SUVmax = 16.5) 2.1 cm RIGHT paratracheal and 2 additional anterior mediastinal lymph nodes  displaying hypermetabolic features with similar FDG uptake. At least 3 small lymph nodes along the RIGHT paratracheal chain display abnormal uptake. No contralateral hilar adenopathy no internal mammary adenopathy Incidental CT findings: Atheromatous plaque of the thoracic aorta. Nonaneurysmal. No pericardial effusion. Background pulmonary emphysema. ABDOMEN/PELVIS: Bilateral adrenal lesions with increased metabolic activity, largest on the LEFT measuring 2.0 x 3.3 cm (SUVmax = 22. Smaller lesionw on the RIGHT associated with the body of the RIGHT adrenal gland measuring less than a cm (SUVmax = 6.8) no additional abnormal uptake within abdominal viscera.) Incidental CT findings: LEFT renal cysts. Aortic atherosclerosis without aneurysm. No adenopathy by size criteria in the abdomen or in the retroperitoneum. No pelvic lymphadenopathy. Post prostatectomy with signs of pelvic lymphadenectomy. SKELETON: Multifocal bony metastatic disease. RIGHT proximal humerus (image 36 of series 4 subtle abnormality in this location measuring approximately 1 cm (SUVmax = 10.3) LEFT second rib lesion (SUVmax = 3.6) Focal area of increased FDG uptake in C3 with lucent area (SUVmax = 4.9 another focus of increased uptake in the anterior aspect of T1 adjacent degenerative change is indeterminate.) LEFT pedicle uptake at T12 and posterior vertebral uptake at T1 suspicious for metastatic lesions. T12 uptake (SUVmax = 7.9) Intense FDG uptake in the anterior aspect of the L2 vertebral body, also involving L4, LEFT iliac crest, marrow space of the proximal LEFT femur and the posterior LEFT seventh rib. There is also subtle uptake in the anterior aspect of the RIGHT iliac crest. Also with subtle uptake noted in the RIGHT distal clavicle. Incidental CT findings: Spinal fusion in the cervical spine at C4-C5. IMPRESSION: Pulmonary neoplasm with nodal, visceral and bony metastases as described. Signs of prostatectomy with pelvic lymphadenectomy.  Electronically Signed   By: Zetta Bills M.D.   On: 09/17/2019 16:54     ASSESSMENT/PLAN:  This is a very pleasant 74 year old Caucasian male with stage IV non-small cell lung cancer.  He presented with a large right hilar mass in addition to mediastinal and supraclavicular lymphadenopathy.  He had metastatic disease to the bone and adrenal glands.  He was diagnosed in July 2021.  His PD-L1 expression is 0%. He has the KEAP1 mutation which would make him eligible for a clinical trial.   The patient was planning o undergoing palliative radiotherapy to the right hilar mass under the care of Dr. Sondra Come. Dr. Julien Nordmann had asked Dr.  Kinard to hold on radiotherapy until we could discuss his option of participating in the clinical trial.   The patient was seen with Dr. Arbutus Ped today.  Dr. Shirline Frees gave the patient the option of talking to the research nurses about the clinical trial which would include standard of care treatment with carboplatin, Alimta, and Keytruda with the addition of targeted treatment with Telaglenastat vs. Continuing on palliative systemic treatment with standard of care carboplatin for an AUC of 5, Alimta 500 mg/m, and Keytruda 200 mg IV every 3 weeks alone.  The patient has met with the research team and has decided to proceed with the trial. His eligibility is being assessed.   We will wait to schedule his follow up once we determine if he is eligable to proceed.   The patient has evidence of microcytic anemia on labs today.  He denies any abnormal bleeding or bruising.  We will arrange for the patient to have his ferritin and iron studies performed to assess for iron deficiency anemia.  Additionally, the patient's potassium is low today at 2.7.  We will arrange for the patient to receive 20 mEq of KCl in the clinic today via IV.  He will also be sent a prescription for 20 mEq twice daily for the next 7 days of potassium chloride.  Regarding the patient's decreased appetite, I  offered to refer the patient to the nutritionist team.  I also encourage the patient to use boost, Ensure, and or Carnation breakfast essentials.  The patient declined a referral at this time.   The patient was advised to call immediately if he has any concerning symptoms in the interval. The patient voices understanding of current disease status and treatment options and is in agreement with the current care plan. All questions were answered. The patient knows to call the clinic with any problems, questions or concerns. We can certainly see the patient much sooner if necessary   Orders Placed This Encounter  Procedures  . Iron and TIBC    Standing Status:   Future    Number of Occurrences:   1    Standing Expiration Date:   10/12/2020  . Ferritin    Standing Status:   Future    Number of Occurrences:   1    Standing Expiration Date:   10/12/2020  . TSH    Standing Status:   Standing    Number of Occurrences:   12    Standing Expiration Date:   10/12/2020     Johnette Abraham Samaiya Awadallah, PA-C 10/13/19   ADDENDUM: Hematology/Oncology Attending: I had a face-to-face encounter with the patient today.  I recommended his care plan.  This is a very pleasant 74 years old white male with a stage IV non-small cell lung cancer, adenocarcinoma presented with large right hilar mass in addition to mediastinal and supraclavicular lymphadenopathy and metastatic disease to the bone and adrenal glands diagnosed in July 2021. The patient had molecular studies that showed no actionable mutation but there was Keap1 E444 mutation which makes the patient potentially eligible for the [facility] clinical trial. The patient was supposed to start the first cycle of his systemic chemotherapy with carboplatin, Alimta and Keytruda today.  I discussed with him the results of the molecular studies and the potential benefit from the [facility] clinical trial.  The patient and his wife are interested in discussion of the trial  with the research team. We will hold his chemotherapy today and if the patient is eligible for  this trial, we will arrange his treatment to start hopefully by next week. We will also hold any palliative radiotherapy for now. We will arrange for the patient a follow-up appointment based on his eligibility for the [facility] clinical trial. For the hypokalemia, will arrange for the patient to receive 20 mEq of potassium chloride IV in the clinic and we will start him on potassium chloride 40 mEq p.o. daily for the next 7 days. For the leukocytosis, the patient was recently treated for infection with a course of antibiotics as well as prednisone.  We will continue to monitor his white blood count closely. The patient was advised to call immediately if he has any concerning symptoms in the interval.  Disclaimer: This note was dictated with voice recognition software. Similar sounding words can inadvertently be transcribed and may be missed upon review. Eilleen Kempf, MD 10/13/19

## 2019-10-13 ENCOUNTER — Inpatient Hospital Stay: Payer: Medicare Other

## 2019-10-13 ENCOUNTER — Inpatient Hospital Stay: Payer: Medicare Other | Admitting: Physician Assistant

## 2019-10-13 ENCOUNTER — Ambulatory Visit: Payer: Medicare Other

## 2019-10-13 ENCOUNTER — Encounter: Payer: Self-pay | Admitting: Physician Assistant

## 2019-10-13 ENCOUNTER — Other Ambulatory Visit: Payer: Self-pay

## 2019-10-13 VITALS — BP 107/57 | HR 88 | Temp 98.3°F | Resp 17 | Ht 69.0 in | Wt 185.2 lb

## 2019-10-13 DIAGNOSIS — E876 Hypokalemia: Secondary | ICD-10-CM

## 2019-10-13 DIAGNOSIS — C3491 Malignant neoplasm of unspecified part of right bronchus or lung: Secondary | ICD-10-CM

## 2019-10-13 DIAGNOSIS — Z7189 Other specified counseling: Secondary | ICD-10-CM | POA: Diagnosis not present

## 2019-10-13 DIAGNOSIS — D649 Anemia, unspecified: Secondary | ICD-10-CM

## 2019-10-13 DIAGNOSIS — C7951 Secondary malignant neoplasm of bone: Secondary | ICD-10-CM | POA: Diagnosis not present

## 2019-10-13 DIAGNOSIS — C7972 Secondary malignant neoplasm of left adrenal gland: Secondary | ICD-10-CM | POA: Diagnosis not present

## 2019-10-13 DIAGNOSIS — Z5111 Encounter for antineoplastic chemotherapy: Secondary | ICD-10-CM | POA: Diagnosis not present

## 2019-10-13 DIAGNOSIS — R630 Anorexia: Secondary | ICD-10-CM

## 2019-10-13 DIAGNOSIS — Z79899 Other long term (current) drug therapy: Secondary | ICD-10-CM | POA: Diagnosis not present

## 2019-10-13 DIAGNOSIS — C7971 Secondary malignant neoplasm of right adrenal gland: Secondary | ICD-10-CM | POA: Diagnosis not present

## 2019-10-13 LAB — TSH: TSH: 0.231 u[IU]/mL — ABNORMAL LOW (ref 0.320–4.118)

## 2019-10-13 LAB — CMP (CANCER CENTER ONLY)
ALT: 38 U/L (ref 0–44)
AST: 38 U/L (ref 15–41)
Albumin: 2.2 g/dL — ABNORMAL LOW (ref 3.5–5.0)
Alkaline Phosphatase: 125 U/L (ref 38–126)
Anion gap: 14 (ref 5–15)
BUN: 28 mg/dL — ABNORMAL HIGH (ref 8–23)
CO2: 26 mmol/L (ref 22–32)
Calcium: 9.4 mg/dL (ref 8.9–10.3)
Chloride: 98 mmol/L (ref 98–111)
Creatinine: 1.18 mg/dL (ref 0.61–1.24)
GFR, Est AFR Am: >60 mL/min (ref >60)
GFR, Estimated: >60 mL/min (ref >60)
Glucose, Bld: 132 mg/dL — ABNORMAL HIGH (ref 70–99)
Potassium: 2.7 mmol/L — CL (ref 3.5–5.1)
Sodium: 138 mmol/L (ref 135–145)
Total Bilirubin: 0.5 mg/dL (ref 0.3–1.2)
Total Protein: 6.5 g/dL (ref 6.5–8.1)

## 2019-10-13 LAB — IRON AND TIBC
Iron: 17 ug/dL — ABNORMAL LOW (ref 42–163)
Saturation Ratios: 9 % — ABNORMAL LOW (ref 20–55)
TIBC: 190 ug/dL — ABNORMAL LOW (ref 202–409)
UIBC: 173 ug/dL (ref 117–376)

## 2019-10-13 LAB — CBC WITH DIFFERENTIAL (CANCER CENTER ONLY)
Abs Immature Granulocytes: 0.28 10*3/uL — ABNORMAL HIGH (ref 0.00–0.07)
Basophils Absolute: 0 10*3/uL (ref 0.0–0.1)
Basophils Relative: 0 %
Eosinophils Absolute: 0 10*3/uL (ref 0.0–0.5)
Eosinophils Relative: 0 %
HCT: 29.8 % — ABNORMAL LOW (ref 39.0–52.0)
Hemoglobin: 9.5 g/dL — ABNORMAL LOW (ref 13.0–17.0)
Immature Granulocytes: 2 %
Lymphocytes Relative: 5 %
Lymphs Abs: 0.9 10*3/uL (ref 0.7–4.0)
MCH: 24.8 pg — ABNORMAL LOW (ref 26.0–34.0)
MCHC: 31.9 g/dL (ref 30.0–36.0)
MCV: 77.8 fL — ABNORMAL LOW (ref 80.0–100.0)
Monocytes Absolute: 1.5 10*3/uL — ABNORMAL HIGH (ref 0.1–1.0)
Monocytes Relative: 8 %
Neutro Abs: 14.8 10*3/uL — ABNORMAL HIGH (ref 1.7–7.7)
Neutrophils Relative %: 85 %
Platelet Count: 474 10*3/uL — ABNORMAL HIGH (ref 150–400)
RBC: 3.83 MIL/uL — ABNORMAL LOW (ref 4.22–5.81)
RDW: 16.2 % — ABNORMAL HIGH (ref 11.5–15.5)
WBC Count: 17.5 10*3/uL — ABNORMAL HIGH (ref 4.0–10.5)
nRBC: 0 % (ref 0.0–0.2)

## 2019-10-13 LAB — FERRITIN: Ferritin: 1749 ng/mL — ABNORMAL HIGH (ref 24–336)

## 2019-10-13 MED ORDER — POTASSIUM CHLORIDE IN NACL 20-0.9 MEQ/L-% IV SOLN
INTRAVENOUS | Status: DC
  Filled 2019-10-13 (×3): qty 1000

## 2019-10-13 MED ORDER — SODIUM CHLORIDE 0.9 % IV SOLN: Freq: Once | INTRAVENOUS | Status: DC

## 2019-10-13 MED ORDER — POTASSIUM CHLORIDE CRYS ER 20 MEQ PO TBCR: 20.0000 meq | EXTENDED_RELEASE_TABLET | Freq: Two times a day (BID) | ORAL | 0 refills | Status: DC

## 2019-10-13 NOTE — Patient Instructions (Signed)
Potassium chloride injection What is this medicine? POTASSIUM CHLORIDE (poe TASS i um KLOOR ide) is a potassium supplement used to prevent and to treat low potassium. Potassium is important for the heart, muscles, and nerves. Too much or too little potassium in the body can cause serious problems. This medicine may be used for other purposes; ask your health care provider or pharmacist if you have questions. COMMON BRAND NAME(S): PROAMP What should I tell my health care provider before I take this medicine? They need to know if you have any of these conditions:  Addison's disease  dehydration  diabetes  heart disease  high levels of potassium in the blood  irregular heartbeat  kidney disease  recent severe burn  an unusual or allergic reaction to potassium, other medicines, foods, dyes, or preservatives  pregnant or trying to get pregnant  breast-feeding How should I use this medicine? This medicine is for infusion into a vein. It is given by a health care professional in a hospital or clinic setting. Talk to your pediatrician regarding the use of this medicine in children. Special care may be needed. Overdosage: If you think you have taken too much of this medicine contact a poison control center or emergency room at once. NOTE: This medicine is only for you. Do not share this medicine with others. What if I miss a dose? This does not apply. What may interact with this medicine? Do not take this medicine with any of the following medications:  certain diuretics such as spironolactone, triamterene  eplerenone  sodium polystyrene sulfonate This medicine may also interact with the following medications:  certain medicines for blood pressure or heart disease like lisinopril, losartan, quinapril, valsartan  medicines that lower your chance of fighting infection such as cyclosporine, tacrolimus  NSAIDs, medicines for pain and inflammation, like ibuprofen or  naproxen  other potassium supplements  salt substitutes This list may not describe all possible interactions. Give your health care provider a list of all the medicines, herbs, non-prescription drugs, or dietary supplements you use. Also tell them if you smoke, drink alcohol, or use illegal drugs. Some items may interact with your medicine. What should I watch for while using this medicine? Your condition will be monitored carefully while you are receiving this medicine. You may need blood work done while you are taking this medicine. What side effects may I notice from receiving this medicine? Side effects that you should report to your doctor or health care professional as soon as possible:  allergic reactions like skin rash, itching or hives, swelling of the face, lips, or tongue  breathing problems  confusion  fast, irregular heartbeat  feeling faint or lightheaded, falls  low blood pressure  numbness or tingling in hands or feet  pain, redness, or irritation at site where injected  unusually weak or tired  weakness, heaviness of legs Side effects that usually do not require medical attention (report to your doctor or health care professional if they continue or are bothersome):  diarrhea  nausea, vomiting  stomach pain This list may not describe all possible side effects. Call your doctor for medical advice about side effects. You may report side effects to FDA at 1-800-FDA-1088. Where should I keep my medicine? This drug is given in a hospital or clinic and will not be stored at home. NOTE: This sheet is a summary. It may not cover all possible information. If you have questions about this medicine, talk to your doctor, pharmacist, or health care provider.    2020 Elsevier/Gold Standard (2015-11-15 13:59:20)  

## 2019-10-14 ENCOUNTER — Telehealth: Payer: Self-pay

## 2019-10-14 ENCOUNTER — Telehealth: Payer: Self-pay | Admitting: Pulmonary Disease

## 2019-10-14 ENCOUNTER — Ambulatory Visit: Payer: Medicare Other

## 2019-10-14 DIAGNOSIS — J189 Pneumonia, unspecified organism: Secondary | ICD-10-CM

## 2019-10-14 DIAGNOSIS — C3491 Malignant neoplasm of unspecified part of right bronchus or lung: Secondary | ICD-10-CM

## 2019-10-14 DIAGNOSIS — R509 Fever, unspecified: Secondary | ICD-10-CM

## 2019-10-14 DIAGNOSIS — J9611 Chronic respiratory failure with hypoxia: Secondary | ICD-10-CM

## 2019-10-14 MED ORDER — AMOXICILLIN-POT CLAVULANATE 875-125 MG PO TABS: 1.0000 | ORAL_TABLET | Freq: Two times a day (BID) | ORAL | 0 refills | Status: DC

## 2019-10-14 NOTE — Telephone Encounter (Signed)
PROGRESS NOTE:  sent schedule message toschedule patient for (chemo only) tomorrow with the standard treatment.Also let them know to please make sure they make him weekly labs and a 1 week follow up visit with Cassie PA or Dr Julien Nordmann next week on Thursday. Schedule message sent.

## 2019-10-14 NOTE — Telephone Encounter (Signed)
Left voicemail for patient to call back [facility] 

## 2019-10-14 NOTE — Research (Signed)
KEAPSAKE: A STUDY OF TELAGLENASTAT WITH STANDARD-OF-CARE CHEMOIMMUNOTHERAPY IN 1L KEAP1/NRF2-MUTATED NONSQUAMOUS NSCLC  10/14/2019 0930AM  This nurse along with system-wide clinical research manager Doreatha Martin met with Dr. Julien Nordmann to advise that unfortunately, this patient is not eligible for participation in the [facility] study with an ECOG of 2 and lab results not within study limits. Patient will be notified.  Dionne Bucy. Sharlett Iles, BSN, RN, CIC 10/14/2019 10:00AM

## 2019-10-14 NOTE — Telephone Encounter (Signed)
KEAPSAKE: A STUDY OF TELAGLENASTAT WITH STANDARD-OF-CARE CHEMOIMMUNOTHERAPY IN 1L KEAP1/NRF2-MUTATED NONSQUAMOUS NSCLC  10/14/2019 1012AM  INCOMING CALL: Received a call from [employer] (Diogenes's wife) stating she was notified that Jenny Reichmann did not quality for the [facility] study and is inquiring about his ineligibility. She was advised that based on clinical assessment and lab results from yesterday that unfortunately, eligibility criteria are not met and therefore, he is unable to participate. She verbalizes understanding and thanks me for the information. She is encouraged to keep my contact information and to contact me with any additional questions or concerns: she verbalizes understanding and appreciation.  Dionne Bucy. Sharlett Iles, BSN, RN, CIC 10/14/2019 11:17 AM

## 2019-10-14 NOTE — Telephone Encounter (Signed)
TC to pt per Cassie PA to let him know that he is not eligable for the study that he was checked for yesterday  and that we are going to schedule him for (chemo only) tomorrow with the standard treatment. Patient verbalized understanding.

## 2019-10-14 NOTE — Research (Addendum)
KEAPSAKE: Wong STUDY OF TELAGLENASTAT WITH STANDARD-OF-CARE CHEMOIMMUNOTHERAPY IN 1L KEAP1/NRF2-MUTATED NONSQUAMOUS NSCLC  10/13/2019 1330PM  Referral received to discuss the above study with the patient and his wife.  CONSENT: Met with Jerry Wong and his wife Jerry Wong in exam room 12 along with clinical research nurse Otilio Miu for approximately 60 minutes following Wong provider appointment to discuss voluntary participation in the [place] study. We reviewed the consent form entirely, including the purpose of the study, the drugs included in the study, the number of people participating, study duration and design, tests and procedures required for participation, participant responsibilities, possible side effects and risks of study drugs and procedures, birth control responsbilities, potential benefits, alternatives to participation, legal rights and care related to study injury, IRB information, compensation (none for this study), voluntary nature of participation, costs, new findings disclosure, confidentiality and future research. We also discussed the included HIPAA authorization including information that is shared, who can see it and how it is used, and the right to refuse or withdraw use of PHI. Both Jerry Wong and his wife verbalize understanding that this is Wong double-blind study, meaning neither the patient, nor the physician, nor study staff will know whether the patient is taking the study agent or Wong placebo. They both verbalize understanding that participants may receive Wong placebo. Contact information for the principal investigator was provided (front page of consent) along with my business card with direct contact information. An opportunity to ask questions was provided and all questions answered. They are encouraged to take the consent form home for review and additional discussion but upon completion of review, Jerry Wong Jerry Wong desire to voluntarily participate in the [facility] study. He is  comforted knowing that he can withdraw his consent to participate at any time without risk of penalty but understands he must notify research staff so any study medications can be safely discontinued. Consent form (v. 5.0, dated 13 Aug 2019) and HIPAA authorization form (included in main consent) are signed and dated by the patient ([employer] testing is already complete). Jerry Wong voluntarily agrees to allow staff to collect and use his data for this study, responds "yes" to all study agreement questions, and signs the consent form himself. Signed copies of the consent are provided along with the [place] brochure and my business card.  Jerry Wong and Jerry Wong are thanked for their time and willingness to participate. Eligibility criteria will be reviewed using clinical documentation and lab values from today and Dr. Julien Nordmann will be notified of any updates.  Dionne Bucy. Sharlett Iles, BSN, RN, CIC 10/13/2019 16:55PM

## 2019-10-14 NOTE — Telephone Encounter (Signed)
TC to pt per Cassie PA to let him know that she would like for him to start taking otc iron supplements. Patient verbalized understanding.

## 2019-10-14 NOTE — Telephone Encounter (Signed)
Primary Pulmonologist: Dr. Silas Flood Last office visit and with whom: 10/07/19 What do we see them for (pulmonary problems): pna, lung cancer Last OV assessment/plan:   Assessment & Plan:   74 y.o. male former smoker (quit 1981) seen for pulmonary consult while hospitalized 09/06/2019 for lung mass. PET scan consistent with metastatic disease. patient underwent navigational bronchoscopy/EBUS that was positive for non-small cell lung carcinoma.   Seen in follow up after prolonged course of antibiotics for post obstructive pneumonia. Symptoms have improved. Feels better overall. Still a bit weak. Poor appetite, no energy. DOE worsening, not improved with LABA. Meeting with rad onc today. Hopeful radiotherapy will palliate DOE and improve exertional capacity. Consider LABA or LAMA if DOE does not improve with palliative radiation.   Follow up as needed.    Lanier Clam, MD 10/07/2019      Reason for call: Patient woke up with 102.2 fever, takes ibuprofen and there is improvement. Patient fully vacciniated in March 2021, his chest tightness is greater than usual but Shortness of breath is the same. Denies runny nose, headache, congestion, and any known exposure.  Dr. Silas Flood please advise.  No Known Allergies  Immunization History  Administered Date(s) Administered  . Influenza, High Dose Seasonal PF 12/06/2016, 12/22/2017  . Influenza-Unspecified 12/21/2017, 01/15/2019  . PFIZER SARS-COV-2 Vaccination 04/29/2019, 05/20/2019  . Pneumococcal Conjugate-13 02/28/2014  . Pneumococcal Polysaccharide-23 12/06/2016  . Td 12/29/2017  . Tdap 02/09/2007

## 2019-10-14 NOTE — Telephone Encounter (Signed)
Called and spoke with Patient's Wife, Jerry Wong.  Dr Hunsucker's recommendations given. Understanding stated.  Jerry Wong aware office cxr is available until 5pm today, and 9-5, with lunch 1:00-2:00 tomorrow. Nothing further at this time.

## 2019-10-15 ENCOUNTER — Ambulatory Visit (INDEPENDENT_AMBULATORY_CARE_PROVIDER_SITE_OTHER): Payer: Medicare Other

## 2019-10-15 ENCOUNTER — Ambulatory Visit: Payer: Medicare Other

## 2019-10-15 ENCOUNTER — Telehealth: Payer: Self-pay | Admitting: Physician Assistant

## 2019-10-15 ENCOUNTER — Other Ambulatory Visit: Payer: Self-pay

## 2019-10-15 DIAGNOSIS — J9611 Chronic respiratory failure with hypoxia: Secondary | ICD-10-CM

## 2019-10-15 DIAGNOSIS — J189 Pneumonia, unspecified organism: Secondary | ICD-10-CM

## 2019-10-15 DIAGNOSIS — R509 Fever, unspecified: Secondary | ICD-10-CM

## 2019-10-15 DIAGNOSIS — C3491 Malignant neoplasm of unspecified part of right bronchus or lung: Secondary | ICD-10-CM | POA: Diagnosis not present

## 2019-10-15 DIAGNOSIS — C3490 Malignant neoplasm of unspecified part of unspecified bronchus or lung: Secondary | ICD-10-CM | POA: Diagnosis not present

## 2019-10-15 DIAGNOSIS — J90 Pleural effusion, not elsewhere classified: Secondary | ICD-10-CM | POA: Diagnosis not present

## 2019-10-15 NOTE — Telephone Encounter (Signed)
Scheduled appt per 8/19 sch msg - per RN Charge no openings on 8/20 - new start scheduled for 8/24 - per Cassie okay . Pt wife is aware of appts.

## 2019-10-18 ENCOUNTER — Ambulatory Visit
Admission: RE | Admit: 2019-10-18 | Discharge: 2019-10-18 | Disposition: A | Discharge status: Home / Self Care | Payer: Medicare Other | Source: Ambulatory Visit | Attending: Radiation Oncology | Admitting: Radiation Oncology

## 2019-10-18 ENCOUNTER — Ambulatory Visit: Payer: Medicare Other

## 2019-10-18 ENCOUNTER — Other Ambulatory Visit: Payer: Medicare Other

## 2019-10-18 ENCOUNTER — Ambulatory Visit: Payer: Medicare Other | Admitting: Physician Assistant

## 2019-10-18 DIAGNOSIS — C3491 Malignant neoplasm of unspecified part of right bronchus or lung: Secondary | ICD-10-CM | POA: Diagnosis not present

## 2019-10-18 DIAGNOSIS — Z510 Encounter for antineoplastic radiation therapy: Secondary | ICD-10-CM | POA: Diagnosis not present

## 2019-10-18 DIAGNOSIS — C342 Malignant neoplasm of middle lobe, bronchus or lung: Secondary | ICD-10-CM | POA: Diagnosis not present

## 2019-10-19 ENCOUNTER — Inpatient Hospital Stay: Payer: Medicare Other

## 2019-10-19 ENCOUNTER — Other Ambulatory Visit: Payer: Self-pay | Admitting: Medical Oncology

## 2019-10-19 ENCOUNTER — Ambulatory Visit
Admission: RE | Admit: 2019-10-19 | Discharge: 2019-10-19 | Disposition: A | Discharge status: Home / Self Care | Payer: Medicare Other | Source: Ambulatory Visit | Attending: Radiation Oncology | Admitting: Radiation Oncology

## 2019-10-19 ENCOUNTER — Ambulatory Visit: Payer: Medicare Other

## 2019-10-19 ENCOUNTER — Other Ambulatory Visit: Payer: Self-pay

## 2019-10-19 VITALS — BP 112/61 | HR 99 | Temp 99.1°F | Resp 18

## 2019-10-19 DIAGNOSIS — Z5111 Encounter for antineoplastic chemotherapy: Secondary | ICD-10-CM

## 2019-10-19 DIAGNOSIS — R110 Nausea: Secondary | ICD-10-CM

## 2019-10-19 DIAGNOSIS — C3491 Malignant neoplasm of unspecified part of right bronchus or lung: Secondary | ICD-10-CM | POA: Diagnosis not present

## 2019-10-19 DIAGNOSIS — C7972 Secondary malignant neoplasm of left adrenal gland: Secondary | ICD-10-CM | POA: Diagnosis not present

## 2019-10-19 DIAGNOSIS — C342 Malignant neoplasm of middle lobe, bronchus or lung: Secondary | ICD-10-CM | POA: Diagnosis not present

## 2019-10-19 DIAGNOSIS — Z79899 Other long term (current) drug therapy: Secondary | ICD-10-CM | POA: Diagnosis not present

## 2019-10-19 DIAGNOSIS — C7951 Secondary malignant neoplasm of bone: Secondary | ICD-10-CM | POA: Diagnosis not present

## 2019-10-19 DIAGNOSIS — C7971 Secondary malignant neoplasm of right adrenal gland: Secondary | ICD-10-CM | POA: Diagnosis not present

## 2019-10-19 DIAGNOSIS — Z510 Encounter for antineoplastic radiation therapy: Secondary | ICD-10-CM | POA: Diagnosis not present

## 2019-10-19 LAB — CBC WITH DIFFERENTIAL (CANCER CENTER ONLY)
Abs Immature Granulocytes: 0.37 10*3/uL — ABNORMAL HIGH (ref 0.00–0.07)
Basophils Absolute: 0.1 10*3/uL (ref 0.0–0.1)
Basophils Relative: 0 %
Eosinophils Absolute: 0 10*3/uL (ref 0.0–0.5)
Eosinophils Relative: 0 %
HCT: 31.5 % — ABNORMAL LOW (ref 39.0–52.0)
Hemoglobin: 9.5 g/dL — ABNORMAL LOW (ref 13.0–17.0)
Immature Granulocytes: 2 %
Lymphocytes Relative: 6 %
Lymphs Abs: 1.2 10*3/uL (ref 0.7–4.0)
MCH: 24 pg — ABNORMAL LOW (ref 26.0–34.0)
MCHC: 30.2 g/dL (ref 30.0–36.0)
MCV: 79.5 fL — ABNORMAL LOW (ref 80.0–100.0)
Monocytes Absolute: 1.4 10*3/uL — ABNORMAL HIGH (ref 0.1–1.0)
Monocytes Relative: 7 %
Neutro Abs: 15.5 10*3/uL — ABNORMAL HIGH (ref 1.7–7.7)
Neutrophils Relative %: 85 %
Platelet Count: 544 10*3/uL — ABNORMAL HIGH (ref 150–400)
RBC: 3.96 MIL/uL — ABNORMAL LOW (ref 4.22–5.81)
RDW: 17.1 % — ABNORMAL HIGH (ref 11.5–15.5)
WBC Count: 18.4 10*3/uL — ABNORMAL HIGH (ref 4.0–10.5)
nRBC: 0 % (ref 0.0–0.2)

## 2019-10-19 LAB — CMP (CANCER CENTER ONLY)
ALT: 30 U/L (ref 0–44)
AST: 32 U/L (ref 15–41)
Albumin: 2.1 g/dL — ABNORMAL LOW (ref 3.5–5.0)
Alkaline Phosphatase: 130 U/L — ABNORMAL HIGH (ref 38–126)
Anion gap: 15 (ref 5–15)
BUN: 17 mg/dL (ref 8–23)
CO2: 25 mmol/L (ref 22–32)
Calcium: 9.9 mg/dL (ref 8.9–10.3)
Chloride: 98 mmol/L (ref 98–111)
Creatinine: 0.91 mg/dL (ref 0.61–1.24)
GFR, Est AFR Am: >60 mL/min (ref >60)
GFR, Estimated: >60 mL/min (ref >60)
Glucose, Bld: 115 mg/dL — ABNORMAL HIGH (ref 70–99)
Potassium: 4.1 mmol/L (ref 3.5–5.1)
Sodium: 138 mmol/L (ref 135–145)
Total Bilirubin: 0.5 mg/dL (ref 0.3–1.2)
Total Protein: 6.8 g/dL (ref 6.5–8.1)

## 2019-10-19 LAB — TSH: TSH: 0.562 u[IU]/mL (ref 0.320–4.118)

## 2019-10-19 MED ORDER — SODIUM CHLORIDE 0.9 % IV SOLN
200.0000 mg | Freq: Once | INTRAVENOUS | Status: AC
  Administered 2019-10-19: 200 mg via INTRAVENOUS
  Filled 2019-10-19: qty 8

## 2019-10-19 MED ORDER — PALONOSETRON HCL INJECTION 0.25 MG/5ML
INTRAVENOUS | Status: AC
  Filled 2019-10-19: qty 5

## 2019-10-19 MED ORDER — SODIUM CHLORIDE 0.9 % IV SOLN
Freq: Once | INTRAVENOUS | Status: AC
  Filled 2019-10-19: qty 250

## 2019-10-19 MED ORDER — SODIUM CHLORIDE 0.9 % IV SOLN
525.5000 mg | Freq: Once | INTRAVENOUS | Status: AC
  Administered 2019-10-19: 530 mg via INTRAVENOUS
  Filled 2019-10-19: qty 53

## 2019-10-19 MED ORDER — SODIUM CHLORIDE 0.9 % IV SOLN
150.0000 mg | Freq: Once | INTRAVENOUS | Status: AC
  Administered 2019-10-19: 150 mg via INTRAVENOUS
  Filled 2019-10-19: qty 150
  Filled 2019-10-19: qty 5

## 2019-10-19 MED ORDER — SODIUM CHLORIDE 0.9 % IV SOLN
10.0000 mg | Freq: Once | INTRAVENOUS | Status: AC
  Administered 2019-10-19: 10 mg via INTRAVENOUS
  Filled 2019-10-19: qty 10
  Filled 2019-10-19: qty 1

## 2019-10-19 MED ORDER — SODIUM CHLORIDE 0.9 % IV SOLN
500.0000 mg/m2 | Freq: Once | INTRAVENOUS | Status: AC
  Administered 2019-10-19: 1000 mg via INTRAVENOUS
  Filled 2019-10-19: qty 40

## 2019-10-19 MED ORDER — PROCHLORPERAZINE MALEATE 10 MG PO TABS: 10.0000 mg | ORAL_TABLET | Freq: Four times a day (QID) | ORAL | 0 refills | Status: AC | PRN

## 2019-10-19 MED ORDER — PALONOSETRON HCL INJECTION 0.25 MG/5ML
0.2500 mg | Freq: Once | INTRAVENOUS | Status: AC
  Administered 2019-10-19: 0.25 mg via INTRAVENOUS

## 2019-10-19 MED ORDER — SODIUM CHLORIDE 0.9 % IV SOLN: 461.5000 mg | Freq: Once | INTRAVENOUS | Status: DC

## 2019-10-19 NOTE — Progress Notes (Signed)
Adjusted Carbo dose based on more recent SCr result (improvement).  Discussed w/ Dr. Julien Nordmann.  Kennith Center, Pharm.D., CPP 10/19/2019@11 :31 AM

## 2019-10-19 NOTE — Patient Instructions (Signed)
[facility] Discharge Instructions for Patients Receiving Chemotherapy  Today you received the following chemotherapy agents Keytruda, alimta, and carboplatin  To help prevent nausea and vomiting after your treatment, we encourage you to take your nausea medication as directed   If you develop nausea and vomiting that is not controlled by your nausea medication, call the clinic.   BELOW ARE SYMPTOMS THAT SHOULD BE REPORTED IMMEDIATELY:  *FEVER GREATER THAN 100.5 F  *CHILLS WITH OR WITHOUT FEVER  NAUSEA AND VOMITING THAT IS NOT CONTROLLED WITH YOUR NAUSEA MEDICATION  *UNUSUAL SHORTNESS OF BREATH  *UNUSUAL BRUISING OR BLEEDING  TENDERNESS IN MOUTH AND THROAT WITH OR WITHOUT PRESENCE OF ULCERS  *URINARY PROBLEMS  *BOWEL PROBLEMS  UNUSUAL RASH Items with * indicate a potential emergency and should be followed up as soon as possible.  Feel free to call the clinic should you have any questions or concerns. The clinic phone number is [phone].  Please show the [facility] at check-in to the Emergency Department and triage nurse.

## 2019-10-20 ENCOUNTER — Ambulatory Visit
Admission: RE | Admit: 2019-10-20 | Discharge: 2019-10-20 | Disposition: A | Discharge status: Home / Self Care | Payer: Medicare Other | Source: Ambulatory Visit | Attending: Radiation Oncology | Admitting: Radiation Oncology

## 2019-10-20 ENCOUNTER — Ambulatory Visit: Payer: Medicare Other

## 2019-10-20 ENCOUNTER — Other Ambulatory Visit: Payer: Self-pay

## 2019-10-20 ENCOUNTER — Telehealth: Payer: Self-pay | Admitting: *Deleted

## 2019-10-20 DIAGNOSIS — Z510 Encounter for antineoplastic radiation therapy: Secondary | ICD-10-CM | POA: Diagnosis not present

## 2019-10-20 DIAGNOSIS — C3491 Malignant neoplasm of unspecified part of right bronchus or lung: Secondary | ICD-10-CM | POA: Diagnosis not present

## 2019-10-20 DIAGNOSIS — C342 Malignant neoplasm of middle lobe, bronchus or lung: Secondary | ICD-10-CM | POA: Diagnosis not present

## 2019-10-21 ENCOUNTER — Other Ambulatory Visit: Payer: Self-pay

## 2019-10-21 ENCOUNTER — Ambulatory Visit
Admission: RE | Admit: 2019-10-21 | Discharge: 2019-10-21 | Disposition: A | Discharge status: Home / Self Care | Payer: Medicare Other | Source: Ambulatory Visit | Attending: Radiation Oncology | Admitting: Radiation Oncology

## 2019-10-21 ENCOUNTER — Ambulatory Visit: Payer: Medicare Other

## 2019-10-21 DIAGNOSIS — C342 Malignant neoplasm of middle lobe, bronchus or lung: Secondary | ICD-10-CM | POA: Diagnosis not present

## 2019-10-21 DIAGNOSIS — Z510 Encounter for antineoplastic radiation therapy: Secondary | ICD-10-CM | POA: Diagnosis not present

## 2019-10-21 DIAGNOSIS — C3491 Malignant neoplasm of unspecified part of right bronchus or lung: Secondary | ICD-10-CM | POA: Diagnosis not present

## 2019-10-22 ENCOUNTER — Other Ambulatory Visit: Payer: Self-pay

## 2019-10-22 ENCOUNTER — Ambulatory Visit
Admission: RE | Admit: 2019-10-22 | Discharge: 2019-10-22 | Disposition: A | Discharge status: Home / Self Care | Payer: Medicare Other | Source: Ambulatory Visit | Attending: Radiation Oncology | Admitting: Radiation Oncology

## 2019-10-22 ENCOUNTER — Ambulatory Visit: Payer: Medicare Other

## 2019-10-22 DIAGNOSIS — C342 Malignant neoplasm of middle lobe, bronchus or lung: Secondary | ICD-10-CM | POA: Diagnosis not present

## 2019-10-22 DIAGNOSIS — C3491 Malignant neoplasm of unspecified part of right bronchus or lung: Secondary | ICD-10-CM | POA: Diagnosis not present

## 2019-10-22 DIAGNOSIS — Z510 Encounter for antineoplastic radiation therapy: Secondary | ICD-10-CM | POA: Diagnosis not present

## 2019-10-25 ENCOUNTER — Ambulatory Visit
Admission: RE | Admit: 2019-10-25 | Discharge: 2019-10-25 | Disposition: A | Discharge status: Home / Self Care | Payer: Medicare Other | Source: Ambulatory Visit | Attending: Radiation Oncology | Admitting: Radiation Oncology

## 2019-10-25 ENCOUNTER — Other Ambulatory Visit: Payer: Self-pay

## 2019-10-25 ENCOUNTER — Other Ambulatory Visit: Payer: Medicare Other

## 2019-10-25 ENCOUNTER — Ambulatory Visit: Payer: Medicare Other

## 2019-10-25 ENCOUNTER — Encounter: Payer: Self-pay | Admitting: *Deleted

## 2019-10-25 DIAGNOSIS — C3491 Malignant neoplasm of unspecified part of right bronchus or lung: Secondary | ICD-10-CM | POA: Diagnosis not present

## 2019-10-25 DIAGNOSIS — Z510 Encounter for antineoplastic radiation therapy: Secondary | ICD-10-CM | POA: Diagnosis not present

## 2019-10-25 DIAGNOSIS — C342 Malignant neoplasm of middle lobe, bronchus or lung: Secondary | ICD-10-CM | POA: Diagnosis not present

## 2019-10-25 NOTE — Progress Notes (Signed)
Oncology Nurse Navigator Documentation  Oncology Nurse Navigator Flowsheets 10/25/2019  Abnormal Finding Date 08/25/2019  Confirmed Diagnosis Date 09/06/2019  Diagnosis Status Confirmed Diagnosis Complete  Planned Course of Treatment Chemo/Radiation Concurrent  Phase of Treatment Radiation  Chemotherapy Actual Start Date: 10/13/2019  Radiation Actual Start Date: 10/18/2019  Navigator Follow Up Date: 10/25/2019  Navigator Follow Up Reason: Appointment Review  Navigator Location [facility]  Navigator Encounter Type Other:  Treatment Initiated Date 10/13/2019  Treatment Phase Treatment  Barriers/Navigation Needs Coordination of Care  Interventions Coordination of Care  Acuity Level 2-Minimal Needs (1-2 Barriers Identified)  Coordination of Care Other  Time Spent with Patient 15

## 2019-10-26 ENCOUNTER — Inpatient Hospital Stay: Payer: Medicare Other

## 2019-10-26 ENCOUNTER — Ambulatory Visit
Admission: RE | Admit: 2019-10-26 | Discharge: 2019-10-26 | Disposition: A | Discharge status: Home / Self Care | Payer: Medicare Other | Source: Ambulatory Visit | Attending: Radiation Oncology | Admitting: Radiation Oncology

## 2019-10-26 ENCOUNTER — Ambulatory Visit: Payer: Self-pay | Admitting: Neurology

## 2019-10-26 ENCOUNTER — Inpatient Hospital Stay (HOSPITAL_BASED_OUTPATIENT_CLINIC_OR_DEPARTMENT_OTHER): Payer: Medicare Other | Admitting: Internal Medicine

## 2019-10-26 ENCOUNTER — Encounter: Payer: Self-pay | Admitting: *Deleted

## 2019-10-26 ENCOUNTER — Other Ambulatory Visit: Payer: Self-pay

## 2019-10-26 ENCOUNTER — Other Ambulatory Visit: Payer: Self-pay | Admitting: *Deleted

## 2019-10-26 ENCOUNTER — Encounter: Payer: Self-pay | Admitting: Internal Medicine

## 2019-10-26 VITALS — BP 103/58 | HR 102 | Temp 98.7°F | Resp 17 | Ht 69.0 in | Wt 176.6 lb

## 2019-10-26 DIAGNOSIS — Z5112 Encounter for antineoplastic immunotherapy: Secondary | ICD-10-CM

## 2019-10-26 DIAGNOSIS — C7971 Secondary malignant neoplasm of right adrenal gland: Secondary | ICD-10-CM | POA: Diagnosis not present

## 2019-10-26 DIAGNOSIS — C7972 Secondary malignant neoplasm of left adrenal gland: Secondary | ICD-10-CM | POA: Diagnosis not present

## 2019-10-26 DIAGNOSIS — C7951 Secondary malignant neoplasm of bone: Secondary | ICD-10-CM | POA: Diagnosis not present

## 2019-10-26 DIAGNOSIS — Z510 Encounter for antineoplastic radiation therapy: Secondary | ICD-10-CM | POA: Diagnosis not present

## 2019-10-26 DIAGNOSIS — C3491 Malignant neoplasm of unspecified part of right bronchus or lung: Secondary | ICD-10-CM

## 2019-10-26 DIAGNOSIS — Z79899 Other long term (current) drug therapy: Secondary | ICD-10-CM | POA: Diagnosis not present

## 2019-10-26 DIAGNOSIS — I10 Essential (primary) hypertension: Secondary | ICD-10-CM

## 2019-10-26 DIAGNOSIS — C342 Malignant neoplasm of middle lobe, bronchus or lung: Secondary | ICD-10-CM | POA: Diagnosis not present

## 2019-10-26 DIAGNOSIS — Z5111 Encounter for antineoplastic chemotherapy: Secondary | ICD-10-CM

## 2019-10-26 LAB — CMP (CANCER CENTER ONLY)
ALT: 36 U/L (ref 0–44)
AST: 63 U/L — ABNORMAL HIGH (ref 15–41)
Albumin: 1.8 g/dL — ABNORMAL LOW (ref 3.5–5.0)
Alkaline Phosphatase: 123 U/L (ref 38–126)
Anion gap: 10 (ref 5–15)
BUN: 34 mg/dL — ABNORMAL HIGH (ref 8–23)
CO2: 26 mmol/L (ref 22–32)
Calcium: 9.6 mg/dL (ref 8.9–10.3)
Chloride: 101 mmol/L (ref 98–111)
Creatinine: 1.16 mg/dL (ref 0.61–1.24)
GFR, Est AFR Am: >60 mL/min (ref >60)
GFR, Estimated: >60 mL/min (ref >60)
Glucose, Bld: 137 mg/dL — ABNORMAL HIGH (ref 70–99)
Potassium: 4.1 mmol/L (ref 3.5–5.1)
Sodium: 137 mmol/L (ref 135–145)
Total Bilirubin: 0.8 mg/dL (ref 0.3–1.2)
Total Protein: 6.4 g/dL — ABNORMAL LOW (ref 6.5–8.1)

## 2019-10-26 LAB — CBC WITH DIFFERENTIAL (CANCER CENTER ONLY)
Abs Immature Granulocytes: 0.04 10*3/uL (ref 0.00–0.07)
Basophils Absolute: 0 10*3/uL (ref 0.0–0.1)
Basophils Relative: 1 %
Eosinophils Absolute: 0.1 10*3/uL (ref 0.0–0.5)
Eosinophils Relative: 7 %
HCT: 26.4 % — ABNORMAL LOW (ref 39.0–52.0)
Hemoglobin: 8.2 g/dL — ABNORMAL LOW (ref 13.0–17.0)
Immature Granulocytes: 2 %
Lymphocytes Relative: 10 %
Lymphs Abs: 0.2 10*3/uL — ABNORMAL LOW (ref 0.7–4.0)
MCH: 24 pg — ABNORMAL LOW (ref 26.0–34.0)
MCHC: 31.1 g/dL (ref 30.0–36.0)
MCV: 77.2 fL — ABNORMAL LOW (ref 80.0–100.0)
Monocytes Absolute: 0 10*3/uL — ABNORMAL LOW (ref 0.1–1.0)
Monocytes Relative: 2 %
Neutro Abs: 1.3 10*3/uL — ABNORMAL LOW (ref 1.7–7.7)
Neutrophils Relative %: 78 %
Platelet Count: 262 10*3/uL (ref 150–400)
RBC: 3.42 MIL/uL — ABNORMAL LOW (ref 4.22–5.81)
RDW: 17.7 % — ABNORMAL HIGH (ref 11.5–15.5)
WBC Count: 1.7 10*3/uL — ABNORMAL LOW (ref 4.0–10.5)
nRBC: 0 % (ref 0.0–0.2)

## 2019-10-26 LAB — TSH: TSH: 0.175 u[IU]/mL — ABNORMAL LOW (ref 0.320–4.118)

## 2019-10-26 MED ORDER — DOXYCYCLINE HYCLATE 100 MG PO TABS: 100.0000 mg | ORAL_TABLET | Freq: Two times a day (BID) | ORAL | 0 refills | Status: DC

## 2019-10-26 MED ORDER — MIRTAZAPINE 30 MG PO TABS: 30.0000 mg | ORAL_TABLET | Freq: Every day | ORAL | 2 refills | Status: DC

## 2019-10-26 NOTE — Progress Notes (Signed)
I spoke with patient and his wife today.  He is receiving systemic therapy along with radiation.  Patient is not eating well and Dr. Julien Nordmann addressed.  They do not want to be referred to dietitian at this time.

## 2019-10-26 NOTE — Progress Notes (Signed)
[facility] [phone]   [phone]  OFFICE PROGRESS NOTE  Pleas Koch, NP [place] [address]  DIAGNOSIS: Stage IV (T4, N3, M1 C) non-small cell lung cancer presented with large right hilar mass in addition to mediastinal lymphadenopathy with bilateral adrenal metastasis and bony metastatic disease diagnosed in July 2021.  PRIOR THERAPY: None.  CURRENT THERAPY: Systemic chemotherapy with carboplatin for AUC of 5, Alimta 500 mg/M2 and Keytruda 200 mg IV every 3 weeks.  First dose 10/19/2019.  INTERVAL HISTORY: Jerry Wong 74 y.o. male returns to the clinic today for follow-up visit accompanied by his wife.  The patient continues to complain of fatigue and weakness as well as shaking.  He had a fever up to 103.4 this morning.  He also has a history of low-grade fever he finished a course of amoxicillin last week.  His temperature in the clinic today was 98.7.  He has no appetite and lost several pounds since his last visit.  He denied having any chest pain, shortness of breath but has cough with no hemoptysis.  He denied having any headache or visual changes.  He has no nausea, vomiting, diarrhea or constipation.  He tolerated the first week of his treatment fairly well.  Is here today for evaluation and repeat blood work.   MEDICAL HISTORY: Past Medical History:  Diagnosis Date  . Arthritis   . Cataract   . Chickenpox   . Chronic headaches   . Chronic kidney disease   . Chronic neck and back pain   . History of kidney stones   . Hyperlipidemia   . Hypertension   . Insomnia   . Neuromuscular disorder ([place])   . Prostate cancer ([place])     ALLERGIES:  has No Known Allergies.  MEDICATIONS:  Current Outpatient Medications  Medication Sig Dispense Refill  . albuterol (VENTOLIN HFA) 108 (90 Base) MCG/ACT inhaler Inhale 2 puffs into the lungs every 4 (four) hours as needed for wheezing or shortness of breath. 18 g 0  .  amitriptyline (ELAVIL) 75 MG tablet Take 1 tablet (75 mg total) by mouth at bedtime. 90 tablet 3  . amLODipine (NORVASC) 10 MG tablet TAKE 1 TABLET BY MOUTH EVERY DAY (Patient taking differently: Take 10 mg by mouth daily. ) 90 tablet 2  . amoxicillin-clavulanate (AUGMENTIN) 875-125 MG tablet Take 1 tablet by mouth 2 (two) times daily. 14 tablet 0  . aspirin EC 81 MG tablet Take 81 mg by mouth daily.     Marland Kitchen atorvastatin (LIPITOR) 20 MG tablet TAKE 1 TABLET BY MOUTH EVERY DAY (Patient taking differently: Take 20 mg by mouth daily. ) 90 tablet 1  . folic acid (FOLVITE) 1 MG tablet Take 1 tablet (1 mg total) by mouth daily. 30 tablet 4  . LYRICA 100 MG capsule Take 100 mg by mouth 2 (two) times daily.   2  . Omega-3 Fatty Acids ([employer] FISH OIL) 1000 MG CAPS TAKE 1 CAPSULE BY MOUTH EVERY DAY WITH A MEAL (Patient taking differently: Take 1,000 mg by mouth daily. TAKE 1 CAPSULE BY MOUTH EVERY DAY WITH A MEAL) 90 capsule 3  . oxyCODONE-acetaminophen (PERCOCET) 10-325 MG per tablet Take 1 tablet by mouth as needed for pain (up to five times daily).     . potassium chloride SA (KLOR-CON) 20 MEQ tablet Take 1 tablet (20 mEq total) by mouth 2 (two) times daily. 14 tablet 0  . prochlorperazine (  COMPAZINE) 10 MG tablet Take 1 tablet (10 mg total) by mouth every 6 (six) hours as needed for nausea or vomiting. 30 tablet 0  . tiZANidine (ZANAFLEX) 4 MG tablet TAKE 1 TABLET BY MOUTH TWICE A DAY (Patient taking differently: Take 4 mg by mouth 2 (two) times daily. TAKE 1 TABLET BY MOUTH TWICE A DAY) 180 tablet 3  . zolpidem (AMBIEN) 10 MG tablet Take 1 tablet (10 mg total) by mouth at bedtime as needed. 30 tablet 4   No current facility-administered medications for this visit.    SURGICAL HISTORY:  Past Surgical History:  Procedure Laterality Date  . BACK SURGERY  1974  . BRONCHIAL NEEDLE ASPIRATION BIOPSY  09/06/2019   Procedure: BRONCHIAL NEEDLE ASPIRATION BIOPSIES;  Surgeon: Candee Furbish, MD;  Location: [facility]  ENDOSCOPY;  Service: Pulmonary;;  . BRONCHIAL WASHINGS  09/06/2019   Procedure: BRONCHIAL WASHINGS;  Surgeon: Candee Furbish, MD;  Location: [facility] ENDOSCOPY;  Service: Pulmonary;;  . NECK SURGERY  2002  . PROSTATECTOMY    . TONSILLECTOMY  1958  . TRIGGER FINGER RELEASE Left 11/20/2017   left thumb/Dr. Apolonio Schneiders  . VIDEO BRONCHOSCOPY WITH ENDOBRONCHIAL ULTRASOUND N/A 09/06/2019   Procedure: VIDEO BRONCHOSCOPY WITH ENDOBRONCHIAL ULTRASOUND;  Surgeon: Candee Furbish, MD;  Location: [facility] ENDOSCOPY;  Service: Pulmonary;  Laterality: N/A;    REVIEW OF SYSTEMS:  Constitutional: positive for fatigue, fevers and weight loss Eyes: negative Ears, nose, mouth, throat, and face: negative Respiratory: negative Cardiovascular: negative Gastrointestinal: negative Genitourinary:negative Integument/breast: negative Hematologic/lymphatic: negative Musculoskeletal:negative Neurological: negative Behavioral/Psych: negative Endocrine: negative Allergic/Immunologic: negative   PHYSICAL EXAMINATION: General appearance: alert, cooperative, fatigued and no distress Head: Normocephalic, without obvious abnormality, atraumatic Neck: no adenopathy, no JVD, supple, symmetrical, trachea midline and thyroid not enlarged, symmetric, no tenderness/mass/nodules Lymph nodes: Cervical, supraclavicular, and axillary nodes normal. Resp: clear to auscultation bilaterally Back: symmetric, no curvature. ROM normal. No CVA tenderness. Cardio: regular rate and rhythm, S1, S2 normal, no murmur, click, rub or gallop GI: soft, non-tender; bowel sounds normal; no masses,  no organomegaly Extremities: extremities normal, atraumatic, no cyanosis or edema Neurologic: Alert and oriented X 3, normal strength and tone. Normal symmetric reflexes. Normal coordination and gait  ECOG PERFORMANCE STATUS: 1 - Symptomatic but completely ambulatory  Blood pressure (!) 103/58, pulse (!) 102, temperature 98.7 F (37.1 C), temperature source  Tympanic, resp. rate 17, height 5\' 9"  (1.753 m), weight 176 lb 9.6 oz (80.1 kg), SpO2 96 %.  LABORATORY DATA: Lab Results  Component Value Date   WBC 18.4 (H) 10/19/2019   HGB 9.5 (L) 10/19/2019   HCT 31.5 (L) 10/19/2019   MCV 79.5 (L) 10/19/2019   PLT 544 (H) 10/19/2019      Chemistry      Component Value Date/Time   NA 138 10/19/2019 0945   K 4.1 10/19/2019 0945   CL 98 10/19/2019 0945   CO2 25 10/19/2019 0945   BUN 17 10/19/2019 0945   CREATININE 0.91 10/19/2019 0945   CREATININE 1.21 (H) 01/15/2019 1541      Component Value Date/Time   CALCIUM 9.9 10/19/2019 0945   ALKPHOS 130 (H) 10/19/2019 0945   AST 32 10/19/2019 0945   ALT 30 10/19/2019 0945   BILITOT 0.5 10/19/2019 0945       RADIOGRAPHIC STUDIES: DG Chest 2 View  Result Date: 10/15/2019 CLINICAL DATA:  Fever, postobstructive pneumonia, lung cancer EXAM: CHEST - 2 VIEW COMPARISON:  09/21/2019 FINDINGS: Right basilar consolidation has progressed, predominantly within the right lower  lobe. Left lung is clear. No pneumothorax or pleural effusion. Cardiac size within normal limits. Right hilar adenopathy is again noted resulting in asymmetric nodular enlargement of the right hilum. Pulmonary vascularity is normal. No acute bone abnormality. IMPRESSION: 1. Progressive right lower lobe consolidation. 2. Right hilar adenopathy, similar to prior. Electronically Signed   By: Fidela Salisbury MD   On: 10/15/2019 19:42    ASSESSMENT AND PLAN: This is a very pleasant 74 years old white male with a stage IV non-small cell lung cancer with large right hilar mass in addition to mediastinal and supraclavicular lymphadenopathy and metastatic disease to the bone and adrenal glands diagnosed in July 2021. The patient is currently undergoing systemic chemotherapy with carboplatin for AUC of 5, Alimta 500 mg/M2 and Keytruda 200 mg IV every 3 weeks status post 1 cycle started on 10/16/2019.  He tolerated the first cycle of his treatment  fairly well with no concerning adverse effects. The patient continues to have low-grade fever but was much higher earlier today at home and normal in the clinic. I recommended for him to continue his current treatment and he will start cycle #2 in 2 weeks. Has mild neutropenia and I will start the patient prophylactically on doxycycline 100 mg p.o. twice daily because of his persistent low-grade fever at home.  The patient was also advised to go immediately to the emergency department if he has any significant fever and chills. For the lack of appetite and depression, I will start the patient on Remeron 30 mg p.o. nightly. He was advised to call immediately if he has any concerning symptoms in the interval. The patient voices understanding of current disease status and treatment options and is in agreement with the current care plan.  All questions were answered. The patient knows to call the clinic with any problems, questions or concerns. We can certainly see the patient much sooner if necessary.   Disclaimer: This note was dictated with voice recognition software. Similar sounding words can inadvertently be transcribed and may not be corrected upon review.

## 2019-10-27 ENCOUNTER — Other Ambulatory Visit: Payer: Self-pay

## 2019-10-27 ENCOUNTER — Ambulatory Visit
Admission: RE | Admit: 2019-10-27 | Discharge: 2019-10-27 | Disposition: A | Discharge status: Home / Self Care | Payer: Medicare Other | Source: Ambulatory Visit | Attending: Radiation Oncology | Admitting: Radiation Oncology

## 2019-10-27 DIAGNOSIS — Z510 Encounter for antineoplastic radiation therapy: Secondary | ICD-10-CM | POA: Diagnosis not present

## 2019-10-27 DIAGNOSIS — C342 Malignant neoplasm of middle lobe, bronchus or lung: Secondary | ICD-10-CM | POA: Diagnosis not present

## 2019-10-27 DIAGNOSIS — C3491 Malignant neoplasm of unspecified part of right bronchus or lung: Secondary | ICD-10-CM | POA: Insufficient documentation

## 2019-10-28 ENCOUNTER — Ambulatory Visit
Admission: RE | Admit: 2019-10-28 | Discharge: 2019-10-28 | Disposition: A | Discharge status: Home / Self Care | Payer: Medicare Other | Source: Ambulatory Visit | Attending: Radiation Oncology | Admitting: Radiation Oncology

## 2019-10-28 ENCOUNTER — Other Ambulatory Visit: Payer: Self-pay

## 2019-10-28 DIAGNOSIS — C342 Malignant neoplasm of middle lobe, bronchus or lung: Secondary | ICD-10-CM | POA: Diagnosis not present

## 2019-10-28 DIAGNOSIS — Z510 Encounter for antineoplastic radiation therapy: Secondary | ICD-10-CM | POA: Diagnosis not present

## 2019-10-28 DIAGNOSIS — C3491 Malignant neoplasm of unspecified part of right bronchus or lung: Secondary | ICD-10-CM | POA: Diagnosis not present

## 2019-10-29 ENCOUNTER — Ambulatory Visit
Admission: RE | Admit: 2019-10-29 | Discharge: 2019-10-29 | Disposition: A | Discharge status: Home / Self Care | Payer: Medicare Other | Source: Ambulatory Visit | Attending: Radiation Oncology | Admitting: Radiation Oncology

## 2019-10-29 ENCOUNTER — Ambulatory Visit: Payer: Medicare Other

## 2019-10-29 ENCOUNTER — Other Ambulatory Visit: Payer: Self-pay

## 2019-10-29 DIAGNOSIS — Z510 Encounter for antineoplastic radiation therapy: Secondary | ICD-10-CM | POA: Diagnosis not present

## 2019-10-29 DIAGNOSIS — C3491 Malignant neoplasm of unspecified part of right bronchus or lung: Secondary | ICD-10-CM | POA: Diagnosis not present

## 2019-10-29 DIAGNOSIS — C342 Malignant neoplasm of middle lobe, bronchus or lung: Secondary | ICD-10-CM | POA: Diagnosis not present

## 2019-11-01 ENCOUNTER — Ambulatory Visit: Payer: Medicare Other

## 2019-11-02 ENCOUNTER — Ambulatory Visit: Payer: Medicare Other

## 2019-11-02 ENCOUNTER — Ambulatory Visit: Payer: Medicare Other | Admitting: Physician Assistant

## 2019-11-02 ENCOUNTER — Ambulatory Visit
Admission: RE | Admit: 2019-11-02 | Discharge: 2019-11-02 | Disposition: A | Discharge status: Home / Self Care | Payer: Medicare Other | Source: Ambulatory Visit | Attending: Radiation Oncology | Admitting: Radiation Oncology

## 2019-11-02 ENCOUNTER — Encounter (HOSPITAL_COMMUNITY): Payer: Self-pay | Admitting: Internal Medicine

## 2019-11-02 ENCOUNTER — Telehealth: Payer: Self-pay | Admitting: *Deleted

## 2019-11-02 ENCOUNTER — Other Ambulatory Visit: Payer: Medicare Other

## 2019-11-02 ENCOUNTER — Inpatient Hospital Stay: Payer: Medicare Other | Attending: Internal Medicine

## 2019-11-02 ENCOUNTER — Other Ambulatory Visit: Payer: Self-pay

## 2019-11-02 DIAGNOSIS — C7971 Secondary malignant neoplasm of right adrenal gland: Secondary | ICD-10-CM | POA: Insufficient documentation

## 2019-11-02 DIAGNOSIS — C3491 Malignant neoplasm of unspecified part of right bronchus or lung: Secondary | ICD-10-CM

## 2019-11-02 DIAGNOSIS — C7951 Secondary malignant neoplasm of bone: Secondary | ICD-10-CM | POA: Insufficient documentation

## 2019-11-02 DIAGNOSIS — Z79899 Other long term (current) drug therapy: Secondary | ICD-10-CM | POA: Insufficient documentation

## 2019-11-02 DIAGNOSIS — Z510 Encounter for antineoplastic radiation therapy: Secondary | ICD-10-CM | POA: Diagnosis not present

## 2019-11-02 DIAGNOSIS — C342 Malignant neoplasm of middle lobe, bronchus or lung: Secondary | ICD-10-CM | POA: Diagnosis not present

## 2019-11-02 DIAGNOSIS — Z5111 Encounter for antineoplastic chemotherapy: Secondary | ICD-10-CM | POA: Insufficient documentation

## 2019-11-02 DIAGNOSIS — C7972 Secondary malignant neoplasm of left adrenal gland: Secondary | ICD-10-CM | POA: Insufficient documentation

## 2019-11-02 LAB — CMP (CANCER CENTER ONLY)
ALT: 54 U/L — ABNORMAL HIGH (ref 0–44)
AST: 56 U/L — ABNORMAL HIGH (ref 15–41)
Albumin: 1.7 g/dL — ABNORMAL LOW (ref 3.5–5.0)
Alkaline Phosphatase: 176 U/L — ABNORMAL HIGH (ref 38–126)
Anion gap: 13 (ref 5–15)
BUN: 28 mg/dL — ABNORMAL HIGH (ref 8–23)
CO2: 23 mmol/L (ref 22–32)
Calcium: 9.3 mg/dL (ref 8.9–10.3)
Chloride: 101 mmol/L (ref 98–111)
Creatinine: 1.03 mg/dL (ref 0.61–1.24)
GFR, Est AFR Am: >60 mL/min (ref >60)
GFR, Estimated: >60 mL/min (ref >60)
Glucose, Bld: 109 mg/dL — ABNORMAL HIGH (ref 70–99)
Potassium: 3.4 mmol/L — ABNORMAL LOW (ref 3.5–5.1)
Sodium: 137 mmol/L (ref 135–145)
Total Bilirubin: 0.6 mg/dL (ref 0.3–1.2)
Total Protein: 6.2 g/dL — ABNORMAL LOW (ref 6.5–8.1)

## 2019-11-02 LAB — CBC WITH DIFFERENTIAL (CANCER CENTER ONLY)
Abs Immature Granulocytes: 0.32 10*3/uL — ABNORMAL HIGH (ref 0.00–0.07)
Basophils Absolute: 0 10*3/uL (ref 0.0–0.1)
Basophils Relative: 0 %
Eosinophils Absolute: 0.1 10*3/uL (ref 0.0–0.5)
Eosinophils Relative: 2 %
HCT: 23 % — ABNORMAL LOW (ref 39.0–52.0)
Hemoglobin: 6.9 g/dL — CL (ref 13.0–17.0)
Immature Granulocytes: 8 %
Lymphocytes Relative: 8 %
Lymphs Abs: 0.3 10*3/uL — ABNORMAL LOW (ref 0.7–4.0)
MCH: 23.7 pg — ABNORMAL LOW (ref 26.0–34.0)
MCHC: 30 g/dL (ref 30.0–36.0)
MCV: 79 fL — ABNORMAL LOW (ref 80.0–100.0)
Monocytes Absolute: 0.4 10*3/uL (ref 0.1–1.0)
Monocytes Relative: 10 %
Neutro Abs: 3 10*3/uL (ref 1.7–7.7)
Neutrophils Relative %: 72 %
Platelet Count: 126 10*3/uL — ABNORMAL LOW (ref 150–400)
RBC: 2.91 MIL/uL — ABNORMAL LOW (ref 4.22–5.81)
RDW: 17.5 % — ABNORMAL HIGH (ref 11.5–15.5)
WBC Count: 4.1 10*3/uL (ref 4.0–10.5)
nRBC: 0 % (ref 0.0–0.2)

## 2019-11-02 LAB — TSH: TSH: 0.511 u[IU]/mL (ref 0.320–4.118)

## 2019-11-02 NOTE — Telephone Encounter (Signed)
Received call from [facility] with critical HGB of 6.9.  Reported to Dr Julien Nordmann & he states pt needs 2 units of blood.  Dianne RN will set up.

## 2019-11-03 ENCOUNTER — Other Ambulatory Visit: Payer: Self-pay | Admitting: Medical Oncology

## 2019-11-03 ENCOUNTER — Other Ambulatory Visit: Payer: Self-pay

## 2019-11-03 ENCOUNTER — Ambulatory Visit: Payer: Medicare Other

## 2019-11-03 ENCOUNTER — Ambulatory Visit
Admission: RE | Admit: 2019-11-03 | Discharge: 2019-11-03 | Disposition: A | Discharge status: Home / Self Care | Payer: Medicare Other | Source: Ambulatory Visit | Attending: Radiation Oncology | Admitting: Radiation Oncology

## 2019-11-03 DIAGNOSIS — D649 Anemia, unspecified: Secondary | ICD-10-CM

## 2019-11-03 DIAGNOSIS — C7972 Secondary malignant neoplasm of left adrenal gland: Secondary | ICD-10-CM | POA: Diagnosis not present

## 2019-11-03 DIAGNOSIS — C342 Malignant neoplasm of middle lobe, bronchus or lung: Secondary | ICD-10-CM | POA: Diagnosis not present

## 2019-11-03 DIAGNOSIS — Z79899 Other long term (current) drug therapy: Secondary | ICD-10-CM | POA: Diagnosis not present

## 2019-11-03 DIAGNOSIS — Z5111 Encounter for antineoplastic chemotherapy: Secondary | ICD-10-CM | POA: Diagnosis not present

## 2019-11-03 DIAGNOSIS — C3491 Malignant neoplasm of unspecified part of right bronchus or lung: Secondary | ICD-10-CM | POA: Diagnosis not present

## 2019-11-03 DIAGNOSIS — Z510 Encounter for antineoplastic radiation therapy: Secondary | ICD-10-CM | POA: Diagnosis not present

## 2019-11-03 DIAGNOSIS — C7971 Secondary malignant neoplasm of right adrenal gland: Secondary | ICD-10-CM | POA: Diagnosis not present

## 2019-11-03 DIAGNOSIS — C7951 Secondary malignant neoplasm of bone: Secondary | ICD-10-CM | POA: Diagnosis not present

## 2019-11-03 LAB — ABO/RH: ABO/RH(D): A POS

## 2019-11-03 NOTE — Addendum Note (Signed)
Addended by: Ardeen Garland on: 11/03/2019 11:34 AM   Modules accepted: Orders

## 2019-11-04 ENCOUNTER — Ambulatory Visit: Payer: Medicare Other | Admitting: Physician Assistant

## 2019-11-04 ENCOUNTER — Inpatient Hospital Stay: Payer: Medicare Other

## 2019-11-04 ENCOUNTER — Other Ambulatory Visit: Payer: Self-pay | Admitting: Medical Oncology

## 2019-11-04 ENCOUNTER — Ambulatory Visit
Admission: RE | Admit: 2019-11-04 | Discharge: 2019-11-04 | Disposition: A | Discharge status: Home / Self Care | Payer: Medicare Other | Source: Ambulatory Visit | Attending: Radiation Oncology | Admitting: Radiation Oncology

## 2019-11-04 ENCOUNTER — Other Ambulatory Visit: Payer: Self-pay

## 2019-11-04 ENCOUNTER — Ambulatory Visit: Payer: Medicare Other

## 2019-11-04 DIAGNOSIS — C7971 Secondary malignant neoplasm of right adrenal gland: Secondary | ICD-10-CM | POA: Diagnosis not present

## 2019-11-04 DIAGNOSIS — C7951 Secondary malignant neoplasm of bone: Secondary | ICD-10-CM | POA: Diagnosis not present

## 2019-11-04 DIAGNOSIS — D649 Anemia, unspecified: Secondary | ICD-10-CM

## 2019-11-04 DIAGNOSIS — Z79899 Other long term (current) drug therapy: Secondary | ICD-10-CM | POA: Diagnosis not present

## 2019-11-04 DIAGNOSIS — C7972 Secondary malignant neoplasm of left adrenal gland: Secondary | ICD-10-CM | POA: Diagnosis not present

## 2019-11-04 DIAGNOSIS — Z5111 Encounter for antineoplastic chemotherapy: Secondary | ICD-10-CM | POA: Diagnosis not present

## 2019-11-04 DIAGNOSIS — C3491 Malignant neoplasm of unspecified part of right bronchus or lung: Secondary | ICD-10-CM | POA: Diagnosis not present

## 2019-11-04 DIAGNOSIS — C342 Malignant neoplasm of middle lobe, bronchus or lung: Secondary | ICD-10-CM | POA: Diagnosis not present

## 2019-11-04 DIAGNOSIS — Z510 Encounter for antineoplastic radiation therapy: Secondary | ICD-10-CM | POA: Diagnosis not present

## 2019-11-04 LAB — PREPARE RBC (CROSSMATCH)

## 2019-11-04 MED ORDER — ACETAMINOPHEN 325 MG PO TABS
650.0000 mg | ORAL_TABLET | Freq: Once | ORAL | Status: AC
  Administered 2019-11-04: 650 mg via ORAL

## 2019-11-04 MED ORDER — DIPHENHYDRAMINE HCL 25 MG PO CAPS
ORAL_CAPSULE | ORAL | Status: AC
  Filled 2019-11-04: qty 1

## 2019-11-04 MED ORDER — DIPHENHYDRAMINE HCL 25 MG PO CAPS
25.0000 mg | ORAL_CAPSULE | Freq: Once | ORAL | Status: AC
  Administered 2019-11-04: 25 mg via ORAL

## 2019-11-04 MED ORDER — ACETAMINOPHEN 325 MG PO TABS
ORAL_TABLET | ORAL | Status: AC
  Filled 2019-11-04: qty 2

## 2019-11-04 MED ORDER — SODIUM CHLORIDE 0.9% IV SOLUTION
250.0000 mL | Freq: Once | INTRAVENOUS | Status: DC
  Filled 2019-11-04: qty 250

## 2019-11-04 NOTE — Patient Instructions (Signed)

## 2019-11-05 ENCOUNTER — Ambulatory Visit: Payer: Medicare Other

## 2019-11-05 ENCOUNTER — Inpatient Hospital Stay: Payer: Medicare Other

## 2019-11-05 ENCOUNTER — Other Ambulatory Visit: Payer: Self-pay

## 2019-11-05 ENCOUNTER — Encounter: Payer: Self-pay | Admitting: Radiation Oncology

## 2019-11-05 ENCOUNTER — Ambulatory Visit
Admission: RE | Admit: 2019-11-05 | Discharge: 2019-11-05 | Disposition: A | Discharge status: Home / Self Care | Payer: Medicare Other | Source: Ambulatory Visit | Attending: Radiation Oncology | Admitting: Radiation Oncology

## 2019-11-05 DIAGNOSIS — C3491 Malignant neoplasm of unspecified part of right bronchus or lung: Secondary | ICD-10-CM | POA: Diagnosis not present

## 2019-11-05 DIAGNOSIS — C7972 Secondary malignant neoplasm of left adrenal gland: Secondary | ICD-10-CM | POA: Diagnosis not present

## 2019-11-05 DIAGNOSIS — Z5111 Encounter for antineoplastic chemotherapy: Secondary | ICD-10-CM | POA: Diagnosis not present

## 2019-11-05 DIAGNOSIS — D649 Anemia, unspecified: Secondary | ICD-10-CM

## 2019-11-05 DIAGNOSIS — C342 Malignant neoplasm of middle lobe, bronchus or lung: Secondary | ICD-10-CM | POA: Diagnosis not present

## 2019-11-05 DIAGNOSIS — C7951 Secondary malignant neoplasm of bone: Secondary | ICD-10-CM | POA: Diagnosis not present

## 2019-11-05 DIAGNOSIS — Z79899 Other long term (current) drug therapy: Secondary | ICD-10-CM | POA: Diagnosis not present

## 2019-11-05 DIAGNOSIS — C7971 Secondary malignant neoplasm of right adrenal gland: Secondary | ICD-10-CM | POA: Diagnosis not present

## 2019-11-05 DIAGNOSIS — Z510 Encounter for antineoplastic radiation therapy: Secondary | ICD-10-CM | POA: Diagnosis not present

## 2019-11-05 MED ORDER — DIPHENHYDRAMINE HCL 25 MG PO CAPS
25.0000 mg | ORAL_CAPSULE | Freq: Once | ORAL | Status: AC
  Administered 2019-11-05: 25 mg via ORAL

## 2019-11-05 MED ORDER — SODIUM CHLORIDE 0.9% IV SOLUTION
250.0000 mL | Freq: Once | INTRAVENOUS | Status: AC
  Administered 2019-11-05: 250 mL via INTRAVENOUS
  Filled 2019-11-05: qty 250

## 2019-11-05 MED ORDER — ACETAMINOPHEN 325 MG PO TABS
650.0000 mg | ORAL_TABLET | Freq: Once | ORAL | Status: AC
  Administered 2019-11-05: 650 mg via ORAL

## 2019-11-05 MED ORDER — ACETAMINOPHEN 325 MG PO TABS
ORAL_TABLET | ORAL | Status: AC
  Filled 2019-11-05: qty 2

## 2019-11-05 MED ORDER — DIPHENHYDRAMINE HCL 25 MG PO CAPS
ORAL_CAPSULE | ORAL | Status: AC
  Filled 2019-11-05: qty 1

## 2019-11-05 NOTE — Patient Instructions (Signed)

## 2019-11-07 LAB — BPAM RBC
Blood Product Expiration Date: 202110052359
Blood Product Expiration Date: 202110052359
ISSUE DATE / TIME: 202109091130
ISSUE DATE / TIME: 202109100753
Unit Type and Rh: 6200
Unit Type and Rh: 6200

## 2019-11-07 LAB — TYPE AND SCREEN
ABO/RH(D): A POS
Antibody Screen: NEGATIVE
Unit division: 0
Unit division: 0

## 2019-11-08 ENCOUNTER — Other Ambulatory Visit: Payer: Medicare Other

## 2019-11-08 ENCOUNTER — Ambulatory Visit: Payer: Medicare Other

## 2019-11-08 ENCOUNTER — Other Ambulatory Visit: Payer: Self-pay | Admitting: Internal Medicine

## 2019-11-09 ENCOUNTER — Inpatient Hospital Stay: Payer: Medicare Other

## 2019-11-09 ENCOUNTER — Other Ambulatory Visit: Payer: Self-pay | Admitting: Physician Assistant

## 2019-11-09 ENCOUNTER — Ambulatory Visit: Payer: Medicare Other

## 2019-11-09 ENCOUNTER — Inpatient Hospital Stay: Payer: Medicare Other | Admitting: Internal Medicine

## 2019-11-09 ENCOUNTER — Encounter: Payer: Self-pay | Admitting: Internal Medicine

## 2019-11-09 ENCOUNTER — Other Ambulatory Visit: Payer: Self-pay

## 2019-11-09 VITALS — BP 122/68 | HR 109 | Temp 95.7°F | Resp 18 | Ht 69.0 in | Wt 173.6 lb

## 2019-11-09 DIAGNOSIS — C3491 Malignant neoplasm of unspecified part of right bronchus or lung: Secondary | ICD-10-CM

## 2019-11-09 DIAGNOSIS — C7971 Secondary malignant neoplasm of right adrenal gland: Secondary | ICD-10-CM | POA: Diagnosis not present

## 2019-11-09 DIAGNOSIS — I10 Essential (primary) hypertension: Secondary | ICD-10-CM

## 2019-11-09 DIAGNOSIS — C7951 Secondary malignant neoplasm of bone: Secondary | ICD-10-CM | POA: Diagnosis not present

## 2019-11-09 DIAGNOSIS — R630 Anorexia: Secondary | ICD-10-CM

## 2019-11-09 DIAGNOSIS — Z5112 Encounter for antineoplastic immunotherapy: Secondary | ICD-10-CM

## 2019-11-09 DIAGNOSIS — Z79899 Other long term (current) drug therapy: Secondary | ICD-10-CM | POA: Diagnosis not present

## 2019-11-09 DIAGNOSIS — C7972 Secondary malignant neoplasm of left adrenal gland: Secondary | ICD-10-CM | POA: Diagnosis not present

## 2019-11-09 DIAGNOSIS — Z5111 Encounter for antineoplastic chemotherapy: Secondary | ICD-10-CM | POA: Diagnosis not present

## 2019-11-09 DIAGNOSIS — T451X5A Adverse effect of antineoplastic and immunosuppressive drugs, initial encounter: Secondary | ICD-10-CM

## 2019-11-09 DIAGNOSIS — D6481 Anemia due to antineoplastic chemotherapy: Secondary | ICD-10-CM

## 2019-11-09 LAB — CMP (CANCER CENTER ONLY)
ALT: 32 U/L (ref 0–44)
AST: 36 U/L (ref 15–41)
Albumin: 1.6 g/dL — ABNORMAL LOW (ref 3.5–5.0)
Alkaline Phosphatase: 169 U/L — ABNORMAL HIGH (ref 38–126)
Anion gap: 8 (ref 5–15)
BUN: 15 mg/dL (ref 8–23)
CO2: 28 mmol/L (ref 22–32)
Calcium: 8.8 mg/dL — ABNORMAL LOW (ref 8.9–10.3)
Chloride: 99 mmol/L (ref 98–111)
Creatinine: 0.78 mg/dL (ref 0.61–1.24)
GFR, Est AFR Am: >60 mL/min (ref >60)
GFR, Estimated: >60 mL/min (ref >60)
Glucose, Bld: 90 mg/dL (ref 70–99)
Potassium: 4.2 mmol/L (ref 3.5–5.1)
Sodium: 135 mmol/L (ref 135–145)
Total Bilirubin: 0.5 mg/dL (ref 0.3–1.2)
Total Protein: 6.1 g/dL — ABNORMAL LOW (ref 6.5–8.1)

## 2019-11-09 LAB — CBC WITH DIFFERENTIAL (CANCER CENTER ONLY)
Abs Immature Granulocytes: 1.06 10*3/uL — ABNORMAL HIGH (ref 0.00–0.07)
Basophils Absolute: 0.1 10*3/uL (ref 0.0–0.1)
Basophils Relative: 1 %
Eosinophils Absolute: 0 10*3/uL (ref 0.0–0.5)
Eosinophils Relative: 0 %
HCT: 28.2 % — ABNORMAL LOW (ref 39.0–52.0)
Hemoglobin: 8.7 g/dL — ABNORMAL LOW (ref 13.0–17.0)
Immature Granulocytes: 8 %
Lymphocytes Relative: 3 %
Lymphs Abs: 0.4 10*3/uL — ABNORMAL LOW (ref 0.7–4.0)
MCH: 25.6 pg — ABNORMAL LOW (ref 26.0–34.0)
MCHC: 30.9 g/dL (ref 30.0–36.0)
MCV: 82.9 fL (ref 80.0–100.0)
Monocytes Absolute: 1.7 10*3/uL — ABNORMAL HIGH (ref 0.1–1.0)
Monocytes Relative: 13 %
Neutro Abs: 9.7 10*3/uL — ABNORMAL HIGH (ref 1.7–7.7)
Neutrophils Relative %: 75 %
Platelet Count: 694 10*3/uL — ABNORMAL HIGH (ref 150–400)
RBC: 3.4 MIL/uL — ABNORMAL LOW (ref 4.22–5.81)
RDW: 20.7 % — ABNORMAL HIGH (ref 11.5–15.5)
WBC Count: 13 10*3/uL — ABNORMAL HIGH (ref 4.0–10.5)
nRBC: 0 % (ref 0.0–0.2)

## 2019-11-09 LAB — TSH: TSH: 0.492 u[IU]/mL (ref 0.320–4.118)

## 2019-11-09 MED ORDER — SODIUM CHLORIDE 0.9 % IV SOLN
10.0000 mg | Freq: Once | INTRAVENOUS | Status: AC
  Administered 2019-11-09: 10 mg via INTRAVENOUS
  Filled 2019-11-09: qty 10

## 2019-11-09 MED ORDER — SODIUM CHLORIDE 0.9 % IV SOLN
200.0000 mg | Freq: Once | INTRAVENOUS | Status: AC
  Administered 2019-11-09: 200 mg via INTRAVENOUS
  Filled 2019-11-09: qty 8

## 2019-11-09 MED ORDER — SODIUM CHLORIDE 0.9 % IV SOLN
Freq: Once | INTRAVENOUS | Status: AC
  Filled 2019-11-09: qty 250

## 2019-11-09 MED ORDER — SODIUM CHLORIDE 0.9 % IV SOLN
514.0000 mg | Freq: Once | INTRAVENOUS | Status: AC
  Administered 2019-11-09: 510 mg via INTRAVENOUS
  Filled 2019-11-09: qty 51

## 2019-11-09 MED ORDER — PALONOSETRON HCL INJECTION 0.25 MG/5ML
INTRAVENOUS | Status: AC
  Filled 2019-11-09: qty 5

## 2019-11-09 MED ORDER — PALONOSETRON HCL INJECTION 0.25 MG/5ML
0.2500 mg | Freq: Once | INTRAVENOUS | Status: AC
  Administered 2019-11-09: 0.25 mg via INTRAVENOUS

## 2019-11-09 MED ORDER — SODIUM CHLORIDE 0.9 % IV SOLN
150.0000 mg | Freq: Once | INTRAVENOUS | Status: AC
  Administered 2019-11-09: 150 mg via INTRAVENOUS
  Filled 2019-11-09: qty 150

## 2019-11-09 MED ORDER — SODIUM CHLORIDE 0.9 % IV SOLN
500.0000 mg/m2 | Freq: Once | INTRAVENOUS | Status: AC
  Administered 2019-11-09: 1000 mg via INTRAVENOUS
  Filled 2019-11-09: qty 40

## 2019-11-09 NOTE — Patient Instructions (Signed)
[facility] Discharge Instructions for Patients Receiving Chemotherapy  Today you received the following chemotherapy agents Pembrolizumab (KEYTRUDA), Pemetrexed (ALIMTA) & Carboplatin (PARAPLATIN).  To help prevent nausea and vomiting after your treatment, we encourage you to take your nausea medication as prescribed.   If you develop nausea and vomiting that is not controlled by your nausea medication, call the clinic.   BELOW ARE SYMPTOMS THAT SHOULD BE REPORTED IMMEDIATELY:  *FEVER GREATER THAN 100.5 F  *CHILLS WITH OR WITHOUT FEVER  NAUSEA AND VOMITING THAT IS NOT CONTROLLED WITH YOUR NAUSEA MEDICATION  *UNUSUAL SHORTNESS OF BREATH  *UNUSUAL BRUISING OR BLEEDING  TENDERNESS IN MOUTH AND THROAT WITH OR WITHOUT PRESENCE OF ULCERS  *URINARY PROBLEMS  *BOWEL PROBLEMS  UNUSUAL RASH Items with * indicate a potential emergency and should be followed up as soon as possible.  Feel free to call the clinic should you have any questions or concerns. The clinic phone number is [phone].  Please show the [facility] at check-in to the Emergency Department and triage nurse.

## 2019-11-09 NOTE — Progress Notes (Signed)
Per Dr. Julien Nordmann: okay to treat with elevated HR.

## 2019-11-09 NOTE — Progress Notes (Signed)
[facility] [phone]   [phone]  OFFICE PROGRESS NOTE  Pleas Koch, NP [place] [address]  DIAGNOSIS: Stage IV (T4, N3, M1 C) non-small cell lung cancer presented with large right hilar mass in addition to mediastinal lymphadenopathy with bilateral adrenal metastasis and bony metastatic disease diagnosed in July 2021.  PRIOR THERAPY: None.  CURRENT THERAPY: Systemic chemotherapy with carboplatin for AUC of 5, Alimta 500 mg/M2 and Keytruda 200 mg IV every 3 weeks.  First dose 10/19/2019.  Status post 1 cycle.  INTERVAL HISTORY: Jerry Wong 74 y.o. male returns to the clinic today for follow-up visit accompanied by his wife.  The patient is feeling fine today with no concerning complaints except for fatigue and generalized weakness.  He tries to exercise at regular basis and walk at home with a walker.  He denied having any chest pain but has shortness of breath with exertion with no cough or hemoptysis.  He denied having any fever or chills.  He has no nausea, vomiting, diarrhea or constipation.  He denied having any headache or visual changes.  He lost few pounds since his last visit.  He is currently on Remeron for depression and to help his appetite.  The patient is here today for evaluation before starting cycle #2.  MEDICAL HISTORY: Past Medical History:  Diagnosis Date  . Arthritis   . Cataract   . Chickenpox   . Chronic headaches   . Chronic kidney disease   . Chronic neck and back pain   . History of kidney stones   . Hyperlipidemia   . Hypertension   . Insomnia   . Neuromuscular disorder ([place])   . Prostate cancer ([employer])     ALLERGIES:  has No Known Allergies.  MEDICATIONS:  Current Outpatient Medications  Medication Sig Dispense Refill  . albuterol (VENTOLIN HFA) 108 (90 Base) MCG/ACT inhaler Inhale 2 puffs into the lungs every 4 (four) hours as needed for wheezing or shortness of breath. 18 g 0  .  amitriptyline (ELAVIL) 75 MG tablet Take 1 tablet (75 mg total) by mouth at bedtime. 90 tablet 3  . amLODipine (NORVASC) 10 MG tablet TAKE 1 TABLET BY MOUTH EVERY DAY (Patient not taking: Reported on 10/26/2019) 90 tablet 2  . aspirin EC 81 MG tablet Take 81 mg by mouth daily.     Marland Kitchen atorvastatin (LIPITOR) 20 MG tablet TAKE 1 TABLET BY MOUTH EVERY DAY (Patient taking differently: Take 20 mg by mouth daily. ) 90 tablet 1  . doxycycline (VIBRA-TABS) 100 MG tablet Take 1 tablet (100 mg total) by mouth 2 (two) times daily. 14 tablet 0  . folic acid (FOLVITE) 1 MG tablet Take 1 tablet (1 mg total) by mouth daily. 30 tablet 4  . LYRICA 100 MG capsule Take 100 mg by mouth 2 (two) times daily.   2  . Omega-3 Fatty Acids ([employer] FISH OIL) 1000 MG CAPS TAKE 1 CAPSULE BY MOUTH EVERY DAY WITH A MEAL (Patient taking differently: Take 1,000 mg by mouth daily. TAKE 1 CAPSULE BY MOUTH EVERY DAY WITH A MEAL) 90 capsule 3  . oxyCODONE-acetaminophen (PERCOCET) 10-325 MG per tablet Take 1 tablet by mouth as needed for pain (up to five times daily).     . potassium chloride SA (KLOR-CON) 20 MEQ tablet Take 1 tablet (20 mEq total) by mouth 2 (two) times daily. 14 tablet 0  . prochlorperazine (COMPAZINE) 10 MG tablet  Take 1 tablet (10 mg total) by mouth every 6 (six) hours as needed for nausea or vomiting. 30 tablet 0  . tiZANidine (ZANAFLEX) 4 MG tablet TAKE 1 TABLET BY MOUTH TWICE A DAY (Patient taking differently: Take 4 mg by mouth 2 (two) times daily. TAKE 1 TABLET BY MOUTH TWICE A DAY) 180 tablet 3   No current facility-administered medications for this visit.    SURGICAL HISTORY:  Past Surgical History:  Procedure Laterality Date  . BACK SURGERY  1974  . BRONCHIAL NEEDLE ASPIRATION BIOPSY  09/06/2019   Procedure: BRONCHIAL NEEDLE ASPIRATION BIOPSIES;  Surgeon: Candee Furbish, MD;  Location: [facility] ENDOSCOPY;  Service: Pulmonary;;  . BRONCHIAL WASHINGS  09/06/2019   Procedure: BRONCHIAL WASHINGS;  Surgeon: Candee Furbish, MD;  Location: [facility] ENDOSCOPY;  Service: Pulmonary;;  . NECK SURGERY  2002  . PROSTATECTOMY    . TONSILLECTOMY  1958  . TRIGGER FINGER RELEASE Left 11/20/2017   left thumb/Dr. Apolonio Schneiders  . VIDEO BRONCHOSCOPY WITH ENDOBRONCHIAL ULTRASOUND N/A 09/06/2019   Procedure: VIDEO BRONCHOSCOPY WITH ENDOBRONCHIAL ULTRASOUND;  Surgeon: Candee Furbish, MD;  Location: [facility] ENDOSCOPY;  Service: Pulmonary;  Laterality: N/A;    REVIEW OF SYSTEMS:  A comprehensive review of systems was negative except for: Constitutional: positive for fatigue and weight loss Respiratory: positive for dyspnea on exertion Musculoskeletal: positive for muscle weakness   PHYSICAL EXAMINATION: General appearance: alert, cooperative, fatigued and no distress Head: Normocephalic, without obvious abnormality, atraumatic Neck: no adenopathy, no JVD, supple, symmetrical, trachea midline and thyroid not enlarged, symmetric, no tenderness/mass/nodules Lymph nodes: Cervical, supraclavicular, and axillary nodes normal. Resp: clear to auscultation bilaterally Back: symmetric, no curvature. ROM normal. No CVA tenderness. Cardio: regular rate and rhythm, S1, S2 normal, no murmur, click, rub or gallop GI: soft, non-tender; bowel sounds normal; no masses,  no organomegaly Extremities: extremities normal, atraumatic, no cyanosis or edema  ECOG PERFORMANCE STATUS: 1 - Symptomatic but completely ambulatory  Blood pressure 122/68, pulse (!) 109, temperature (!) 95.7 F (35.4 C), temperature source Tympanic, resp. rate 18, height 5\' 9"  (1.753 m), weight 173 lb 9.6 oz (78.7 kg), SpO2 95 %.  LABORATORY DATA: Lab Results  Component Value Date   WBC 4.1 11/02/2019   HGB 6.9 (LL) 11/02/2019   HCT 23.0 (L) 11/02/2019   MCV 79.0 (L) 11/02/2019   PLT 126 (L) 11/02/2019      Chemistry      Component Value Date/Time   NA 137 11/02/2019 1213   K 3.4 (L) 11/02/2019 1213   CL 101 11/02/2019 1213   CO2 23 11/02/2019 1213   BUN 28 (H) 11/02/2019  1213   CREATININE 1.03 11/02/2019 1213   CREATININE 1.21 (H) 01/15/2019 1541      Component Value Date/Time   CALCIUM 9.3 11/02/2019 1213   ALKPHOS 176 (H) 11/02/2019 1213   AST 56 (H) 11/02/2019 1213   ALT 54 (H) 11/02/2019 1213   BILITOT 0.6 11/02/2019 1213       RADIOGRAPHIC STUDIES: DG Chest 2 View  Result Date: 10/15/2019 CLINICAL DATA:  Fever, postobstructive pneumonia, lung cancer EXAM: CHEST - 2 VIEW COMPARISON:  09/21/2019 FINDINGS: Right basilar consolidation has progressed, predominantly within the right lower lobe. Left lung is clear. No pneumothorax or pleural effusion. Cardiac size within normal limits. Right hilar adenopathy is again noted resulting in asymmetric nodular enlargement of the right hilum. Pulmonary vascularity is normal. No acute bone abnormality. IMPRESSION: 1. Progressive right lower lobe consolidation. 2. Right hilar adenopathy, similar  to prior. Electronically Signed   By: Fidela Salisbury MD   On: 10/15/2019 19:42    ASSESSMENT AND PLAN: This is a very pleasant 74 years old white male with a stage IV non-small cell lung cancer with large right hilar mass in addition to mediastinal and supraclavicular lymphadenopathy and metastatic disease to the bone and adrenal glands diagnosed in July 2021. The patient is currently undergoing systemic chemotherapy with carboplatin for AUC of 5, Alimta 500 mg/M2 and Keytruda 200 mg IV every 3 weeks status post 1 cycle started on 10/16/2019.   The patient tolerated the first cycle of his treatment well except for the fatigue. I recommended for him to proceed with cycle #2 today as planned. I will see him back for follow-up visit in 3 weeks for evaluation before starting cycle #3. For the lack of appetite and depression, I will start the patient on Remeron 30 mg p.o. nightly. He was advised to call immediately if he has any concerning symptoms in the interval. The patient voices understanding of current disease status and  treatment options and is in agreement with the current care plan.  All questions were answered. The patient knows to call the clinic with any problems, questions or concerns. We can certainly see the patient much sooner if necessary.   Disclaimer: This note was dictated with voice recognition software. Similar sounding words can inadvertently be transcribed and may not be corrected upon review.

## 2019-11-10 ENCOUNTER — Ambulatory Visit: Payer: Medicare Other

## 2019-11-10 DIAGNOSIS — J9601 Acute respiratory failure with hypoxia: Secondary | ICD-10-CM | POA: Diagnosis not present

## 2019-11-11 ENCOUNTER — Ambulatory Visit: Payer: Medicare Other

## 2019-11-12 ENCOUNTER — Ambulatory Visit: Payer: Medicare Other

## 2019-11-15 ENCOUNTER — Other Ambulatory Visit: Payer: Self-pay

## 2019-11-15 DIAGNOSIS — C3491 Malignant neoplasm of unspecified part of right bronchus or lung: Secondary | ICD-10-CM

## 2019-11-16 ENCOUNTER — Telehealth: Payer: Self-pay | Admitting: Medical Oncology

## 2019-11-16 ENCOUNTER — Inpatient Hospital Stay: Payer: Medicare Other

## 2019-11-16 ENCOUNTER — Other Ambulatory Visit: Payer: Self-pay | Admitting: Medical Oncology

## 2019-11-16 ENCOUNTER — Other Ambulatory Visit: Payer: Self-pay

## 2019-11-16 ENCOUNTER — Telehealth: Payer: Self-pay | Admitting: Primary Care

## 2019-11-16 DIAGNOSIS — E785 Hyperlipidemia, unspecified: Secondary | ICD-10-CM

## 2019-11-16 DIAGNOSIS — Z5111 Encounter for antineoplastic chemotherapy: Secondary | ICD-10-CM | POA: Diagnosis not present

## 2019-11-16 DIAGNOSIS — C7972 Secondary malignant neoplasm of left adrenal gland: Secondary | ICD-10-CM | POA: Diagnosis not present

## 2019-11-16 DIAGNOSIS — I10 Essential (primary) hypertension: Secondary | ICD-10-CM

## 2019-11-16 DIAGNOSIS — D6481 Anemia due to antineoplastic chemotherapy: Secondary | ICD-10-CM

## 2019-11-16 DIAGNOSIS — T451X5A Adverse effect of antineoplastic and immunosuppressive drugs, initial encounter: Secondary | ICD-10-CM

## 2019-11-16 DIAGNOSIS — C7951 Secondary malignant neoplasm of bone: Secondary | ICD-10-CM | POA: Diagnosis not present

## 2019-11-16 DIAGNOSIS — C3491 Malignant neoplasm of unspecified part of right bronchus or lung: Secondary | ICD-10-CM

## 2019-11-16 DIAGNOSIS — C7971 Secondary malignant neoplasm of right adrenal gland: Secondary | ICD-10-CM | POA: Diagnosis not present

## 2019-11-16 DIAGNOSIS — Z79899 Other long term (current) drug therapy: Secondary | ICD-10-CM | POA: Diagnosis not present

## 2019-11-16 LAB — CMP (CANCER CENTER ONLY)
ALT: 60 U/L — ABNORMAL HIGH (ref 0–44)
AST: 86 U/L — ABNORMAL HIGH (ref 15–41)
Albumin: 1.7 g/dL — ABNORMAL LOW (ref 3.5–5.0)
Alkaline Phosphatase: 224 U/L — ABNORMAL HIGH (ref 38–126)
Anion gap: 11 (ref 5–15)
BUN: 17 mg/dL (ref 8–23)
CO2: 24 mmol/L (ref 22–32)
Calcium: 8.7 mg/dL — ABNORMAL LOW (ref 8.9–10.3)
Chloride: 98 mmol/L (ref 98–111)
Creatinine: 0.84 mg/dL (ref 0.61–1.24)
GFR, Est AFR Am: >60 mL/min (ref >60)
GFR, Estimated: >60 mL/min (ref >60)
Glucose, Bld: 103 mg/dL — ABNORMAL HIGH (ref 70–99)
Potassium: 4.1 mmol/L (ref 3.5–5.1)
Sodium: 133 mmol/L — ABNORMAL LOW (ref 135–145)
Total Bilirubin: 0.8 mg/dL (ref 0.3–1.2)
Total Protein: 6.4 g/dL — ABNORMAL LOW (ref 6.5–8.1)

## 2019-11-16 LAB — CBC WITH DIFFERENTIAL (CANCER CENTER ONLY)
Abs Immature Granulocytes: 0.13 10*3/uL — ABNORMAL HIGH (ref 0.00–0.07)
Basophils Absolute: 0 10*3/uL (ref 0.0–0.1)
Basophils Relative: 0 %
Eosinophils Absolute: 0 10*3/uL (ref 0.0–0.5)
Eosinophils Relative: 0 %
HCT: 23.9 % — ABNORMAL LOW (ref 39.0–52.0)
Hemoglobin: 7.3 g/dL — ABNORMAL LOW (ref 13.0–17.0)
Immature Granulocytes: 6 %
Lymphocytes Relative: 13 %
Lymphs Abs: 0.3 10*3/uL — ABNORMAL LOW (ref 0.7–4.0)
MCH: 25.5 pg — ABNORMAL LOW (ref 26.0–34.0)
MCHC: 30.5 g/dL (ref 30.0–36.0)
MCV: 83.6 fL (ref 80.0–100.0)
Monocytes Absolute: 0.1 10*3/uL (ref 0.1–1.0)
Monocytes Relative: 3 %
Neutro Abs: 1.8 10*3/uL (ref 1.7–7.7)
Neutrophils Relative %: 78 %
Platelet Count: 192 10*3/uL (ref 150–400)
RBC: 2.86 MIL/uL — ABNORMAL LOW (ref 4.22–5.81)
RDW: 21.1 % — ABNORMAL HIGH (ref 11.5–15.5)
WBC Count: 2.2 10*3/uL — ABNORMAL LOW (ref 4.0–10.5)
nRBC: 0 % (ref 0.0–0.2)

## 2019-11-16 LAB — SAMPLE TO BLOOD BANK

## 2019-11-16 NOTE — Telephone Encounter (Signed)
Pt notified he needs one unit of blood per Dr Julien Nordmann. Schedule message sent.

## 2019-11-17 LAB — PREPARE RBC (CROSSMATCH)

## 2019-11-18 ENCOUNTER — Other Ambulatory Visit: Payer: Self-pay

## 2019-11-18 ENCOUNTER — Inpatient Hospital Stay: Payer: Medicare Other

## 2019-11-18 DIAGNOSIS — C3491 Malignant neoplasm of unspecified part of right bronchus or lung: Secondary | ICD-10-CM | POA: Diagnosis not present

## 2019-11-18 DIAGNOSIS — D6481 Anemia due to antineoplastic chemotherapy: Secondary | ICD-10-CM

## 2019-11-18 DIAGNOSIS — Z79899 Other long term (current) drug therapy: Secondary | ICD-10-CM | POA: Diagnosis not present

## 2019-11-18 DIAGNOSIS — Z5111 Encounter for antineoplastic chemotherapy: Secondary | ICD-10-CM | POA: Diagnosis not present

## 2019-11-18 DIAGNOSIS — C7971 Secondary malignant neoplasm of right adrenal gland: Secondary | ICD-10-CM | POA: Diagnosis not present

## 2019-11-18 DIAGNOSIS — T451X5A Adverse effect of antineoplastic and immunosuppressive drugs, initial encounter: Secondary | ICD-10-CM

## 2019-11-18 DIAGNOSIS — C7951 Secondary malignant neoplasm of bone: Secondary | ICD-10-CM | POA: Diagnosis not present

## 2019-11-18 DIAGNOSIS — C7972 Secondary malignant neoplasm of left adrenal gland: Secondary | ICD-10-CM | POA: Diagnosis not present

## 2019-11-18 MED ORDER — SODIUM CHLORIDE 0.9% IV SOLUTION
250.0000 mL | Freq: Once | INTRAVENOUS | Status: AC
  Administered 2019-11-18: 250 mL via INTRAVENOUS
  Filled 2019-11-18: qty 250

## 2019-11-18 MED ORDER — DIPHENHYDRAMINE HCL 25 MG PO CAPS
ORAL_CAPSULE | ORAL | Status: AC
  Filled 2019-11-18: qty 1

## 2019-11-18 MED ORDER — DIPHENHYDRAMINE HCL 25 MG PO CAPS
25.0000 mg | ORAL_CAPSULE | Freq: Once | ORAL | Status: AC
  Administered 2019-11-18: 25 mg via ORAL

## 2019-11-18 MED ORDER — ACETAMINOPHEN 325 MG PO TABS
ORAL_TABLET | ORAL | Status: AC
  Filled 2019-11-18: qty 2

## 2019-11-18 MED ORDER — ACETAMINOPHEN 325 MG PO TABS
650.0000 mg | ORAL_TABLET | Freq: Once | ORAL | Status: AC
  Administered 2019-11-18: 650 mg via ORAL

## 2019-11-18 NOTE — Patient Instructions (Signed)

## 2019-11-18 NOTE — Telephone Encounter (Signed)
Looks like one of meds were d/c by another provider and yo have not seen in office after that. Ok to fill or do you want to see for f/U?

## 2019-11-19 LAB — TYPE AND SCREEN
ABO/RH(D): A POS
Antibody Screen: NEGATIVE
Unit division: 0

## 2019-11-19 LAB — BPAM RBC
Blood Product Expiration Date: 202110082359
ISSUE DATE / TIME: 202109231455
Unit Type and Rh: 6200

## 2019-11-19 NOTE — Telephone Encounter (Signed)
He is actually due for CPE/follow up with me in November 2021. Will send to [facility] for scheduling.

## 2019-11-23 ENCOUNTER — Other Ambulatory Visit: Payer: Self-pay

## 2019-11-23 ENCOUNTER — Inpatient Hospital Stay: Payer: Medicare Other

## 2019-11-23 DIAGNOSIS — Z79899 Other long term (current) drug therapy: Secondary | ICD-10-CM | POA: Diagnosis not present

## 2019-11-23 DIAGNOSIS — C7971 Secondary malignant neoplasm of right adrenal gland: Secondary | ICD-10-CM | POA: Diagnosis not present

## 2019-11-23 DIAGNOSIS — C7951 Secondary malignant neoplasm of bone: Secondary | ICD-10-CM | POA: Diagnosis not present

## 2019-11-23 DIAGNOSIS — Z5111 Encounter for antineoplastic chemotherapy: Secondary | ICD-10-CM | POA: Diagnosis not present

## 2019-11-23 DIAGNOSIS — C3491 Malignant neoplasm of unspecified part of right bronchus or lung: Secondary | ICD-10-CM | POA: Diagnosis not present

## 2019-11-23 DIAGNOSIS — C7972 Secondary malignant neoplasm of left adrenal gland: Secondary | ICD-10-CM | POA: Diagnosis not present

## 2019-11-23 LAB — CBC WITH DIFFERENTIAL (CANCER CENTER ONLY)
Abs Immature Granulocytes: 0.02 10*3/uL (ref 0.00–0.07)
Basophils Absolute: 0 10*3/uL (ref 0.0–0.1)
Basophils Relative: 0 %
Eosinophils Absolute: 0 10*3/uL (ref 0.0–0.5)
Eosinophils Relative: 0 %
HCT: 23.9 % — ABNORMAL LOW (ref 39.0–52.0)
Hemoglobin: 7.4 g/dL — ABNORMAL LOW (ref 13.0–17.0)
Immature Granulocytes: 1 %
Lymphocytes Relative: 9 %
Lymphs Abs: 0.3 10*3/uL — ABNORMAL LOW (ref 0.7–4.0)
MCH: 26.4 pg (ref 26.0–34.0)
MCHC: 31 g/dL (ref 30.0–36.0)
MCV: 85.4 fL (ref 80.0–100.0)
Monocytes Absolute: 0.4 10*3/uL (ref 0.1–1.0)
Monocytes Relative: 12 %
Neutro Abs: 2.7 10*3/uL (ref 1.7–7.7)
Neutrophils Relative %: 78 %
Platelet Count: 42 10*3/uL — ABNORMAL LOW (ref 150–400)
RBC: 2.8 MIL/uL — ABNORMAL LOW (ref 4.22–5.81)
RDW: 20.2 % — ABNORMAL HIGH (ref 11.5–15.5)
WBC Count: 3.5 10*3/uL — ABNORMAL LOW (ref 4.0–10.5)
nRBC: 0 % (ref 0.0–0.2)

## 2019-11-23 LAB — CMP (CANCER CENTER ONLY)
ALT: 59 U/L — ABNORMAL HIGH (ref 0–44)
AST: 60 U/L — ABNORMAL HIGH (ref 15–41)
Albumin: 1.7 g/dL — ABNORMAL LOW (ref 3.5–5.0)
Alkaline Phosphatase: 286 U/L — ABNORMAL HIGH (ref 38–126)
Anion gap: 8 (ref 5–15)
BUN: 17 mg/dL (ref 8–23)
CO2: 27 mmol/L (ref 22–32)
Calcium: 9 mg/dL (ref 8.9–10.3)
Chloride: 101 mmol/L (ref 98–111)
Creatinine: 0.81 mg/dL (ref 0.61–1.24)
GFR, Est AFR Am: >60 mL/min (ref >60)
GFR, Estimated: >60 mL/min (ref >60)
Glucose, Bld: 112 mg/dL — ABNORMAL HIGH (ref 70–99)
Potassium: 4 mmol/L (ref 3.5–5.1)
Sodium: 136 mmol/L (ref 135–145)
Total Bilirubin: 0.5 mg/dL (ref 0.3–1.2)
Total Protein: 6.5 g/dL (ref 6.5–8.1)

## 2019-11-24 ENCOUNTER — Ambulatory Visit: Payer: Medicare Other | Admitting: Internal Medicine

## 2019-11-24 ENCOUNTER — Other Ambulatory Visit: Payer: Medicare Other

## 2019-11-24 ENCOUNTER — Ambulatory Visit: Payer: Medicare Other

## 2019-11-24 NOTE — Progress Notes (Incomplete)
  Patient Name: Jerry Wong MRN: [id number] DOB: Nov 05, 1945 Referring Physician: Curt Bears (Profile Not Attached) Date of Service: 11/05/2019 [name], [name]                                                        End Of Treatment Note  Diagnoses: C34.91-Malignant neoplasm of unspecified part of right bronchus or lung  Cancer Staging: Stage IV (T4, N3, M1c) non-small cell right lung cancer with bilateral adrenal metastasis and bony metastatic disease  Intent: Palliative  Radiation Treatment Dates: 10/18/2019 through 11/05/2019 Site Technique Total Dose (Gy) Dose per Fx (Gy) Completed Fx Beam Energies  Lung, Right: Lung_Rt 3D 35/35 2.5 14/14 6X, 10X, 15X   Narrative: The patient tolerated radiation therapy relatively well. He did report a baseline dry cough and baseline shortness of breath. He continued on 2L supplemental oxygen via nasal cannula. He also reported occasional right temporal headaches, poor appetite, fever (started on Doxycycline by Dr. Julien Nordmann), and increasing fatigue. He likely had an infection and that, combined with radiation treatments, made him feel so fatigued. He denied chest pain, difficulty swallowing, skin changes within the treatment field, nausea, vomiting, diplopia, and tinnitus.  Plan: The patient will follow-up with radiation oncology in one month.  ________________________________________________   Blair Promise, PhD, MD  This document serves as a record of services personally performed by Gery Pray, MD. It was created on his behalf by Clerance Lav, a trained medical scribe. The creation of this record is based on the scribe's personal observations and the provider's statements to them. This document has been checked and approved by the attending provider.

## 2019-11-30 ENCOUNTER — Other Ambulatory Visit: Payer: Self-pay | Admitting: Medical Oncology

## 2019-11-30 ENCOUNTER — Inpatient Hospital Stay: Payer: Medicare Other

## 2019-11-30 ENCOUNTER — Other Ambulatory Visit: Payer: Self-pay

## 2019-11-30 ENCOUNTER — Inpatient Hospital Stay: Payer: Medicare Other | Attending: Internal Medicine | Admitting: Internal Medicine

## 2019-11-30 ENCOUNTER — Encounter: Payer: Self-pay | Admitting: Internal Medicine

## 2019-11-30 VITALS — BP 97/71 | HR 55 | Temp 97.8°F | Resp 18 | Ht 69.0 in | Wt 161.7 lb

## 2019-11-30 DIAGNOSIS — T451X5A Adverse effect of antineoplastic and immunosuppressive drugs, initial encounter: Secondary | ICD-10-CM

## 2019-11-30 DIAGNOSIS — C3490 Malignant neoplasm of unspecified part of unspecified bronchus or lung: Secondary | ICD-10-CM

## 2019-11-30 DIAGNOSIS — C3491 Malignant neoplasm of unspecified part of right bronchus or lung: Secondary | ICD-10-CM

## 2019-11-30 DIAGNOSIS — D6481 Anemia due to antineoplastic chemotherapy: Secondary | ICD-10-CM

## 2019-11-30 DIAGNOSIS — R630 Anorexia: Secondary | ICD-10-CM | POA: Diagnosis not present

## 2019-11-30 DIAGNOSIS — Z5112 Encounter for antineoplastic immunotherapy: Secondary | ICD-10-CM | POA: Diagnosis not present

## 2019-11-30 DIAGNOSIS — Z5111 Encounter for antineoplastic chemotherapy: Secondary | ICD-10-CM | POA: Insufficient documentation

## 2019-11-30 DIAGNOSIS — C7970 Secondary malignant neoplasm of unspecified adrenal gland: Secondary | ICD-10-CM | POA: Insufficient documentation

## 2019-11-30 DIAGNOSIS — R531 Weakness: Secondary | ICD-10-CM | POA: Insufficient documentation

## 2019-11-30 DIAGNOSIS — T451X5S Adverse effect of antineoplastic and immunosuppressive drugs, sequela: Secondary | ICD-10-CM | POA: Insufficient documentation

## 2019-11-30 DIAGNOSIS — Z79899 Other long term (current) drug therapy: Secondary | ICD-10-CM | POA: Diagnosis not present

## 2019-11-30 DIAGNOSIS — C7951 Secondary malignant neoplasm of bone: Secondary | ICD-10-CM | POA: Diagnosis not present

## 2019-11-30 DIAGNOSIS — R634 Abnormal weight loss: Secondary | ICD-10-CM | POA: Insufficient documentation

## 2019-11-30 LAB — CMP (CANCER CENTER ONLY)
ALT: 22 U/L (ref 0–44)
AST: 31 U/L (ref 15–41)
Albumin: 1.8 g/dL — ABNORMAL LOW (ref 3.5–5.0)
Alkaline Phosphatase: 246 U/L — ABNORMAL HIGH (ref 38–126)
Anion gap: 7 (ref 5–15)
BUN: 14 mg/dL (ref 8–23)
CO2: 26 mmol/L (ref 22–32)
Calcium: 9 mg/dL (ref 8.9–10.3)
Chloride: 102 mmol/L (ref 98–111)
Creatinine: 0.85 mg/dL (ref 0.61–1.24)
GFR, Estimated: >60 mL/min (ref >60)
Glucose, Bld: 122 mg/dL — ABNORMAL HIGH (ref 70–99)
Potassium: 3.7 mmol/L (ref 3.5–5.1)
Sodium: 135 mmol/L (ref 135–145)
Total Bilirubin: 0.4 mg/dL (ref 0.3–1.2)
Total Protein: 6.3 g/dL — ABNORMAL LOW (ref 6.5–8.1)

## 2019-11-30 LAB — CBC WITH DIFFERENTIAL (CANCER CENTER ONLY)
Abs Immature Granulocytes: 0.48 10*3/uL — ABNORMAL HIGH (ref 0.00–0.07)
Basophils Absolute: 0 10*3/uL (ref 0.0–0.1)
Basophils Relative: 0 %
Eosinophils Absolute: 0 10*3/uL (ref 0.0–0.5)
Eosinophils Relative: 0 %
HCT: 21.7 % — ABNORMAL LOW (ref 39.0–52.0)
Hemoglobin: 6.8 g/dL — CL (ref 13.0–17.0)
Immature Granulocytes: 6 %
Lymphocytes Relative: 3 %
Lymphs Abs: 0.3 10*3/uL — ABNORMAL LOW (ref 0.7–4.0)
MCH: 26.9 pg (ref 26.0–34.0)
MCHC: 31.3 g/dL (ref 30.0–36.0)
MCV: 85.8 fL (ref 80.0–100.0)
Monocytes Absolute: 1.4 10*3/uL — ABNORMAL HIGH (ref 0.1–1.0)
Monocytes Relative: 19 %
Neutro Abs: 5.4 10*3/uL (ref 1.7–7.7)
Neutrophils Relative %: 72 %
Platelet Count: 518 10*3/uL — ABNORMAL HIGH (ref 150–400)
RBC: 2.53 MIL/uL — ABNORMAL LOW (ref 4.22–5.81)
RDW: 22.4 % — ABNORMAL HIGH (ref 11.5–15.5)
WBC Count: 7.6 10*3/uL (ref 4.0–10.5)
nRBC: 0 % (ref 0.0–0.2)

## 2019-11-30 LAB — TSH: TSH: 0.508 u[IU]/mL (ref 0.320–4.118)

## 2019-11-30 MED ORDER — SODIUM CHLORIDE 0.9 % IV SOLN
150.0000 mg | Freq: Once | INTRAVENOUS | Status: AC
  Administered 2019-11-30: 150 mg via INTRAVENOUS
  Filled 2019-11-30: qty 150

## 2019-11-30 MED ORDER — PALONOSETRON HCL INJECTION 0.25 MG/5ML
0.2500 mg | Freq: Once | INTRAVENOUS | Status: AC
  Administered 2019-11-30: 0.25 mg via INTRAVENOUS

## 2019-11-30 MED ORDER — SODIUM CHLORIDE 0.9 % IV SOLN
400.0000 mg/m2 | Freq: Once | INTRAVENOUS | Status: AC
  Administered 2019-11-30: 800 mg via INTRAVENOUS
  Filled 2019-11-30: qty 20

## 2019-11-30 MED ORDER — SODIUM CHLORIDE 0.9 % IV SOLN
200.0000 mg | Freq: Once | INTRAVENOUS | Status: AC
  Administered 2019-11-30: 200 mg via INTRAVENOUS
  Filled 2019-11-30: qty 8

## 2019-11-30 MED ORDER — CYANOCOBALAMIN 1000 MCG/ML IJ SOLN
INTRAMUSCULAR | Status: AC
  Filled 2019-11-30: qty 1

## 2019-11-30 MED ORDER — SODIUM CHLORIDE 0.9 % IV SOLN
10.0000 mg | Freq: Once | INTRAVENOUS | Status: AC
  Administered 2019-11-30: 10 mg via INTRAVENOUS
  Filled 2019-11-30: qty 10

## 2019-11-30 MED ORDER — CYANOCOBALAMIN 1000 MCG/ML IJ SOLN
1000.0000 ug | Freq: Once | INTRAMUSCULAR | Status: AC
  Administered 2019-11-30: 1000 ug via INTRAMUSCULAR

## 2019-11-30 MED ORDER — SODIUM CHLORIDE 0.9 % IV SOLN
420.0000 mg | Freq: Once | INTRAVENOUS | Status: AC
  Administered 2019-11-30: 420 mg via INTRAVENOUS
  Filled 2019-11-30: qty 42

## 2019-11-30 MED ORDER — SODIUM CHLORIDE 0.9 % IV SOLN
Freq: Once | INTRAVENOUS | Status: AC
  Filled 2019-11-30: qty 250

## 2019-11-30 MED ORDER — PALONOSETRON HCL INJECTION 0.25 MG/5ML
INTRAVENOUS | Status: AC
  Filled 2019-11-30: qty 5

## 2019-11-30 NOTE — Progress Notes (Signed)
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  PRIOR THERAPY: None.  CURRENT THERAPY: Systemic chemotherapy with carboplatin for AUC of 5, Alimta 500 mg/M2 and Keytruda 200 mg IV every 3 weeks.  First dose 10/19/2019.  Status post 2 cycles.  INTERVAL HISTORY: Jerry Wong 74 y.o. male returns to the clinic today for follow-up visit.  The patient is feeling fine today with no concerning complaints except for the persistent fatigue and lack of appetite.  He lost more than 10 pounds since his last visit.  He denied having any current chest pain, shortness of breath, cough or hemoptysis.  He denied having any fever or chills.  He has no nausea, vomiting, diarrhea or constipation.  He tolerated the second cycle of his treatment fairly well.  He is here today for evaluation before starting cycle #3.  MEDICAL HISTORY: Past Medical History:  Diagnosis Date  . Arthritis   . Cataract   . Chickenpox   . Chronic headaches   . Chronic kidney disease   . Chronic neck and back pain   . History of kidney stones   . Hyperlipidemia   . Hypertension   . Insomnia   . Neuromuscular disorder ([place])   . Prostate cancer ([place])     ALLERGIES:  has No Known Allergies.  MEDICATIONS:  Current Outpatient Medications  Medication Sig Dispense Refill  . albuterol (VENTOLIN HFA) 108 (90 Base) MCG/ACT inhaler Inhale 2 puffs into the lungs every 4 (four) hours as needed for wheezing or shortness of breath. 18 g 0  . amitriptyline (ELAVIL) 75 MG tablet Take 1 tablet (75 mg total) by mouth at bedtime. 90 tablet 3  . amLODipine (NORVASC) 10 MG tablet TAKE 1 TABLET BY MOUTH  EVERY DAY 90 tablet 2  . aspirin EC 81 MG tablet Take 81 mg by mouth daily.     Marland Kitchen atorvastatin (LIPITOR) 20 MG tablet Take 1 tablet (20 mg total) by mouth daily. For cholesterol. 90 tablet 0  . folic acid (FOLVITE) 1 MG tablet Take 1 tablet (1 mg total) by mouth daily. 30 tablet 4  . LYRICA 100 MG capsule Take 100 mg by mouth 2 (two) times daily.   2  . mirtazapine (REMERON) 30 MG tablet Take 30 mg by mouth at bedtime.    . Omega-3 Fatty Acids ([employer]S FISH OIL) 1000 MG CAPS TAKE 1 CAPSULE BY MOUTH EVERY DAY WITH A MEAL (Patient taking differently: Take 1,000 mg by mouth daily. TAKE 1 CAPSULE BY MOUTH EVERY DAY WITH A MEAL) 90 capsule 3  . oxyCODONE-acetaminophen (PERCOCET) 10-325 MG per tablet Take 1 tablet by mouth as needed for pain (up to five times daily).     . prochlorperazine (COMPAZINE) 10 MG tablet Take 1 tablet (10 mg total) by mouth every 6 (six) hours as needed for nausea or vomiting. 30 tablet 0  . tiZANidine (ZANAFLEX) 4 MG tablet TAKE 1 TABLET BY MOUTH TWICE A DAY (Patient taking differently: Take 4 mg by mouth 2 (two) times daily. TAKE 1 TABLET BY MOUTH TWICE A DAY) 180 tablet 3  . zolpidem (AMBIEN) 10 MG tablet Take 10 mg by mouth at bedtime  as needed.     No current facility-administered medications for this visit.    SURGICAL HISTORY:  Past Surgical History:  Procedure Laterality Date  . BACK SURGERY  1974  . BRONCHIAL NEEDLE ASPIRATION BIOPSY  09/06/2019   Procedure: BRONCHIAL NEEDLE ASPIRATION BIOPSIES;  Surgeon: Candee Furbish, MD;  Location[facility] ENDOSCOPY;  Service: Pulmonary;;  . BRONCHIAL WASHINGS  09/06/2019   Procedure: BRONCHIAL WASHINGS;  Surgeon: Candee Furbish, MD;  Location: [facility] ENDOSCOPY;  Service: Pulmonary;;  . NECK SURGERY  2002  . PROSTATECTOMY    . TONSILLECTOMY  1958  . TRIGGER FINGER RELEASE Left 11/20/2017   left thumb/Dr. Apolonio Schneiders  . VIDEO BRONCHOSCOPY WITH ENDOBRONCHIAL ULTRASOUND N/A 09/06/2019   Procedure: VIDEO BRONCHOSCOPY WITH ENDOBRONCHIAL  ULTRASOUND;  Surgeon: Candee Furbish, MD;  Location[facility] END[facility]COPY;  Service: Pulmonary;  Laterality: N/A;    REVIEW OF SYSTEMS:  Constitutional: positive for anorexia, fatigue and weight loss Eyes: negative Ears, nose, mouth, throat, and face: negative Respiratory: negative Cardiovascular: negative Gastrointestinal: negative Genitourinary:negative Integument/breast: negative Hematologic/lymphatic: negative Musculoskeletal:positive for muscle weakness Neurological: negative Behavioral/Psych: negative Endocrine: negative Allergic/Immunologic: negative   PHYSICAL EXAMINATION: General appearance: alert, cooperative, fatigued and no distress Head: Normocephalic, without obvious abnormality, atraumatic Neck: no adenopathy, no JVD, supple, symmetrical, trachea midline and thyroid not enlarged, symmetric, no tenderness/mass/nodules Lymph nodes: Cervical, supraclavicular, and axillary nodes normal. Resp: clear to auscultation bilaterally Back: symmetric, no curvature. ROM normal. No CVA tenderness. Cardio: regular rate and rhythm, S1, S2 normal, no murmur, click, rub or gallop GI: soft, non-tender; bowel sounds normal; no masses,  no organomegaly Extremities: extremities normal, atraumatic, no cyanosis or edema Neurologic: Alert and oriented X 3, normal strength and tone. Normal symmetric reflexes. Normal coordination and gait  ECOG PERFORMANCE STATUS: 1 - Symptomatic but completely ambulatory  Blood pressure 97/71, pulse (!) 55, temperature 97.8 F (36.6 C), temperature source Tympanic, resp. rate 18, height 5\' 9"  (1.753 m), weight 161 lb 11.2 oz (73.3 kg), SpO2 100 %.  LABORATORY DATA: Lab Results  Component Value Date   WBC 7.6 11/30/2019   HGB 6.8 (LL) 11/30/2019   HCT 21.7 (L) 11/30/2019   MCV 85.8 11/30/2019   PLT 518 (H) 11/30/2019      Chemistry      Component Value Date/Time   NA 135 11/30/2019 1104   K 3.7 11/30/2019 1104   CL 102 11/30/2019 1104   CO2 26  11/30/2019 1104   BUN 14 11/30/2019 1104   CREATININE 0.85 11/30/2019 1104   CREATININE 1.21 (H) 01/15/2019 1541      Component Value Date/Time   CALCIUM 9.0 11/30/2019 1104   ALKPHOS 246 (H) 11/30/2019 1104   AST 31 11/30/2019 1104   ALT 22 11/30/2019 1104   BILITOT 0.4 11/30/2019 1104       RADIOGRAPHIC STUDIES: No results found.  ASSESSMENT AND PLAN: This is a very pleasant 74 years old white male with a stage IV non-small cell lung cancer with large right hilar mass in addition to mediastinal and supraclavicular lymphadenopathy and metastatic disease to the bone and adrenal glands diagnosed in July 2021. The patient is currently undergoing systemic chemotherapy with carboplatin for AUC of 5, Alimta 500 mg/M2 and Keytruda 200 mg IV every 3 weeks status post 2 cycles started on 10/16/2019.   The patient tolerated the second cycle of his treatment much better but he continues to have increasing fatigue and weakness as well as lack of appetite and weight loss. I recommended for him  to proceed with cycle #3 today but I will reduce the dose of carboplatin to AUC of 4 and Alimta 400 mg/M2 starting from cycle #3. For the chemotherapy-induced anemia, I will arrange for the patient to receive 2 units of PRBCs transfusion this week. For the lack of appetite he will continue on his current treatment with Remeron and I strongly encouraged the patient to increase his oral intake and snacks between meals. I will see him back for follow-up visit in 3 weeks for evaluation with repeat CT scan of the chest, abdomen pelvis for restaging of his disease. The patient was advised to call immediately if he has any concerning symptoms in the interval.  The patient voices understanding of current disease status and treatment options and is in agreement with the current care plan.  All questions were answered. The patient knows to call the clinic with any problems, questions or concerns. We can certainly see the  patient much sooner if necessary.   Disclaimer: This note was dictated with voice recognition software. Similar sounding words can inadvertently be transcribed and may not be corrected upon review.

## 2019-11-30 NOTE — Telephone Encounter (Signed)
Called patient to schedule cpe/ follow up. LVM to call back and schedule.

## 2019-11-30 NOTE — Patient Instructions (Addendum)
[facility] Discharge Instructions for Patients Receiving Chemotherapy  Today you received the following chemotherapy agents: Keytruda/Alimta/Carboplatin.  To help prevent nausea and vomiting after your treatment, we encourage you to take your nausea medication as directed.   If you develop nausea and vomiting that is not controlled by your nausea medication, call the clinic.   BELOW ARE SYMPTOMS THAT SHOULD BE REPORTED IMMEDIATELY:  *FEVER GREATER THAN 100.5 F  *CHILLS WITH OR WITHOUT FEVER  NAUSEA AND VOMITING THAT IS NOT CONTROLLED WITH YOUR NAUSEA MEDICATION  *UNUSUAL SHORTNESS OF BREATH  *UNUSUAL BRUISING OR BLEEDING  TENDERNESS IN MOUTH AND THROAT WITH OR WITHOUT PRESENCE OF ULCERS  *URINARY PROBLEMS  *BOWEL PROBLEMS  UNUSUAL RASH Items with * indicate a potential emergency and should be followed up as soon as possible.  Feel free to call the clinic should you have any questions or concerns. The clinic phone number is [phone].  Please show the [facility] at check-in to the Emergency Department and triage nurse.  Cyanocobalamin, Vitamin B12 injection What is this medicine? CYANOCOBALAMIN (sye an oh koe BAL a min) is a man made form of vitamin B12. Vitamin B12 is used in the growth of healthy blood cells, nerve cells, and proteins in the body. It also helps with the metabolism of fats and carbohydrates. This medicine is used to treat people who can not absorb vitamin B12. This medicine may be used for other purposes; ask your health care provider or pharmacist if you have questions. COMMON BRAND NAME(S): B-12 Compliance Kit, B-12 Injection Kit, Cyomin, LA-12, Nutri-Twelve, Physicians EZ Use B-12, Primabalt What should I tell my health care provider before I take this medicine? They need to know if you have any of these conditions:  kidney disease  Leber's disease  megaloblastic anemia  an unusual or allergic reaction to cyanocobalamin,  cobalt, other medicines, foods, dyes, or preservatives  pregnant or trying to get pregnant  breast-feeding How should I use this medicine? This medicine is injected into a muscle or deeply under the skin. It is usually given by a health care professional in a clinic or doctor's office. However, your doctor may teach you how to inject yourself. Follow all instructions. Talk to your pediatrician regarding the use of this medicine in children. Special care may be needed. Overdosage: If you think you have taken too much of this medicine contact a poison control center or emergency room at once. NOTE: This medicine is only for you. Do not share this medicine with others. What if I miss a dose? If you are given your dose at a clinic or doctor's office, call to reschedule your appointment. If you give your own injections and you miss a dose, take it as soon as you can. If it is almost time for your next dose, take only that dose. Do not take double or extra doses. What may interact with this medicine?  colchicine  heavy alcohol intake This list may not describe all possible interactions. Give your health care provider a list of all the medicines, herbs, non-prescription drugs, or dietary supplements you use. Also tell them if you smoke, drink alcohol, or use illegal drugs. Some items may interact with your medicine. What should I watch for while using this medicine? Visit your doctor or health care professional regularly. You may need blood work done while you are taking this medicine. You may need to follow a special diet. Talk to your doctor. Limit your alcohol intake and  avoid smoking to get the best benefit. What side effects may I notice from receiving this medicine? Side effects that you should report to your doctor or health care professional as soon as possible:  allergic reactions like skin rash, itching or hives, swelling of the face, lips, or tongue  blue tint to skin  chest  tightness, pain  difficulty breathing, wheezing  dizziness  red, swollen painful area on the leg Side effects that usually do not require medical attention (report to your doctor or health care professional if they continue or are bothersome):  diarrhea  headache This list may not describe all possible side effects. Call your doctor for medical advice about side effects. You may report side effects to FDA at 1-800-FDA-1088. Where should I keep my medicine? Keep out of the reach of children. Store at room temperature between 15 and 30 degrees C (59 and 85 degrees F). Protect from light. Throw away any unused medicine after the expiration date. NOTE: This sheet is a summary. It may not cover all possible information. If you have questions about this medicine, talk to your doctor, pharmacist, or health care provider.  2020 Elsevier/Gold Standard (2007-05-25 22:10:20)

## 2019-11-30 NOTE — Progress Notes (Signed)
Per Dr. Worthy Flank office note, okay to proceed with treatment today.

## 2019-12-01 ENCOUNTER — Telehealth: Payer: Self-pay | Admitting: Internal Medicine

## 2019-12-01 ENCOUNTER — Inpatient Hospital Stay: Payer: Medicare Other

## 2019-12-01 DIAGNOSIS — Z79899 Other long term (current) drug therapy: Secondary | ICD-10-CM | POA: Diagnosis not present

## 2019-12-01 DIAGNOSIS — C7951 Secondary malignant neoplasm of bone: Secondary | ICD-10-CM | POA: Diagnosis not present

## 2019-12-01 DIAGNOSIS — D6481 Anemia due to antineoplastic chemotherapy: Secondary | ICD-10-CM

## 2019-12-01 DIAGNOSIS — Z5111 Encounter for antineoplastic chemotherapy: Secondary | ICD-10-CM | POA: Diagnosis not present

## 2019-12-01 DIAGNOSIS — C7970 Secondary malignant neoplasm of unspecified adrenal gland: Secondary | ICD-10-CM | POA: Diagnosis not present

## 2019-12-01 DIAGNOSIS — R531 Weakness: Secondary | ICD-10-CM | POA: Diagnosis not present

## 2019-12-01 DIAGNOSIS — T451X5S Adverse effect of antineoplastic and immunosuppressive drugs, sequela: Secondary | ICD-10-CM | POA: Diagnosis not present

## 2019-12-01 DIAGNOSIS — R634 Abnormal weight loss: Secondary | ICD-10-CM | POA: Diagnosis not present

## 2019-12-01 DIAGNOSIS — T451X5A Adverse effect of antineoplastic and immunosuppressive drugs, initial encounter: Secondary | ICD-10-CM

## 2019-12-01 DIAGNOSIS — C3491 Malignant neoplasm of unspecified part of right bronchus or lung: Secondary | ICD-10-CM | POA: Diagnosis not present

## 2019-12-01 LAB — PREPARE RBC (CROSSMATCH)

## 2019-12-01 LAB — SAMPLE TO BLOOD BANK

## 2019-12-01 MED ORDER — SODIUM CHLORIDE 0.9% IV SOLUTION
250.0000 mL | Freq: Once | INTRAVENOUS | Status: AC
  Administered 2019-12-01: 250 mL via INTRAVENOUS
  Filled 2019-12-01: qty 250

## 2019-12-01 MED ORDER — DIPHENHYDRAMINE HCL 25 MG PO CAPS
ORAL_CAPSULE | ORAL | Status: AC
  Filled 2019-12-01: qty 1

## 2019-12-01 MED ORDER — ACETAMINOPHEN 325 MG PO TABS
650.0000 mg | ORAL_TABLET | Freq: Once | ORAL | Status: AC
  Administered 2019-12-01: 650 mg via ORAL

## 2019-12-01 MED ORDER — DIPHENHYDRAMINE HCL 25 MG PO CAPS
25.0000 mg | ORAL_CAPSULE | Freq: Once | ORAL | Status: AC
  Administered 2019-12-01: 25 mg via ORAL

## 2019-12-01 MED ORDER — ACETAMINOPHEN 325 MG PO TABS
ORAL_TABLET | ORAL | Status: AC
  Filled 2019-12-01: qty 2

## 2019-12-01 NOTE — Patient Instructions (Signed)

## 2019-12-01 NOTE — Telephone Encounter (Signed)
appts already on schedule per 10/5 los - added additional appts , pt to get an updated schedule next visit.

## 2019-12-02 ENCOUNTER — Other Ambulatory Visit: Payer: Self-pay | Admitting: Internal Medicine

## 2019-12-02 LAB — BPAM RBC
Blood Product Expiration Date: 202110182359
Blood Product Expiration Date: 202110192359
ISSUE DATE / TIME: 202110060812
ISSUE DATE / TIME: 202110060812
Unit Type and Rh: 6200
Unit Type and Rh: 6200

## 2019-12-02 LAB — TYPE AND SCREEN
ABO/RH(D): A POS
Antibody Screen: NEGATIVE
Unit division: 0
Unit division: 0

## 2019-12-02 NOTE — Telephone Encounter (Signed)
Called patient and spoke with wife. Patient scheduled for follow up only in November. Please advise for medications.

## 2019-12-02 NOTE — Telephone Encounter (Signed)
Please advise 

## 2019-12-04 NOTE — Telephone Encounter (Signed)
Okay to see for follow up as scheduled.

## 2019-12-07 ENCOUNTER — Inpatient Hospital Stay: Payer: Medicare Other

## 2019-12-07 ENCOUNTER — Other Ambulatory Visit: Payer: Self-pay

## 2019-12-07 DIAGNOSIS — R634 Abnormal weight loss: Secondary | ICD-10-CM | POA: Diagnosis not present

## 2019-12-07 DIAGNOSIS — D6481 Anemia due to antineoplastic chemotherapy: Secondary | ICD-10-CM | POA: Diagnosis not present

## 2019-12-07 DIAGNOSIS — C7951 Secondary malignant neoplasm of bone: Secondary | ICD-10-CM | POA: Diagnosis not present

## 2019-12-07 DIAGNOSIS — C7970 Secondary malignant neoplasm of unspecified adrenal gland: Secondary | ICD-10-CM | POA: Diagnosis not present

## 2019-12-07 DIAGNOSIS — C3491 Malignant neoplasm of unspecified part of right bronchus or lung: Secondary | ICD-10-CM | POA: Diagnosis not present

## 2019-12-07 DIAGNOSIS — Z5111 Encounter for antineoplastic chemotherapy: Secondary | ICD-10-CM | POA: Diagnosis not present

## 2019-12-07 DIAGNOSIS — R531 Weakness: Secondary | ICD-10-CM | POA: Diagnosis not present

## 2019-12-07 DIAGNOSIS — Z79899 Other long term (current) drug therapy: Secondary | ICD-10-CM | POA: Diagnosis not present

## 2019-12-07 DIAGNOSIS — T451X5S Adverse effect of antineoplastic and immunosuppressive drugs, sequela: Secondary | ICD-10-CM | POA: Diagnosis not present

## 2019-12-07 LAB — CMP (CANCER CENTER ONLY)
ALT: 25 U/L (ref 0–44)
AST: 42 U/L — ABNORMAL HIGH (ref 15–41)
Albumin: 1.8 g/dL — ABNORMAL LOW (ref 3.5–5.0)
Alkaline Phosphatase: 186 U/L — ABNORMAL HIGH (ref 38–126)
Anion gap: 8 (ref 5–15)
BUN: 17 mg/dL (ref 8–23)
CO2: 28 mmol/L (ref 22–32)
Calcium: 9.1 mg/dL (ref 8.9–10.3)
Chloride: 100 mmol/L (ref 98–111)
Creatinine: 0.78 mg/dL (ref 0.61–1.24)
GFR, Estimated: >60 mL/min (ref >60)
Glucose, Bld: 93 mg/dL (ref 70–99)
Potassium: 3.9 mmol/L (ref 3.5–5.1)
Sodium: 136 mmol/L (ref 135–145)
Total Bilirubin: 0.9 mg/dL (ref 0.3–1.2)
Total Protein: 6.6 g/dL (ref 6.5–8.1)

## 2019-12-07 LAB — CBC WITH DIFFERENTIAL (CANCER CENTER ONLY)
Abs Immature Granulocytes: 0.06 10*3/uL (ref 0.00–0.07)
Basophils Absolute: 0 10*3/uL (ref 0.0–0.1)
Basophils Relative: 0 %
Eosinophils Absolute: 0 10*3/uL (ref 0.0–0.5)
Eosinophils Relative: 1 %
HCT: 26.2 % — ABNORMAL LOW (ref 39.0–52.0)
Hemoglobin: 8.2 g/dL — ABNORMAL LOW (ref 13.0–17.0)
Immature Granulocytes: 4 %
Lymphocytes Relative: 13 %
Lymphs Abs: 0.2 10*3/uL — ABNORMAL LOW (ref 0.7–4.0)
MCH: 28.2 pg (ref 26.0–34.0)
MCHC: 31.3 g/dL (ref 30.0–36.0)
MCV: 90 fL (ref 80.0–100.0)
Monocytes Absolute: 0.1 10*3/uL (ref 0.1–1.0)
Monocytes Relative: 9 %
Neutro Abs: 1.1 10*3/uL — ABNORMAL LOW (ref 1.7–7.7)
Neutrophils Relative %: 73 %
Platelet Count: 232 10*3/uL (ref 150–400)
RBC: 2.91 MIL/uL — ABNORMAL LOW (ref 4.22–5.81)
RDW: 21.3 % — ABNORMAL HIGH (ref 11.5–15.5)
WBC Count: 1.5 10*3/uL — ABNORMAL LOW (ref 4.0–10.5)
nRBC: 0 % (ref 0.0–0.2)

## 2019-12-12 NOTE — Progress Notes (Signed)
Radiation Oncology         [phone] ________________________________  Name: Jerry Wong MRN: [id number]  Date: 12/13/2019  DOB: 1945-05-17  Follow-Up Visit Note  CC: Pleas Koch, NP  Curt Bears, MD    ICD-10-CM   1. 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 He continues on systemic chemotherapy with Carboplatin and Keytruda under the care of Dr. Julien Nordmann and is status post three cycles. He has tolerated treatment relatively well with the exception of persistent fatigue, anemia, and poor appetite.  On review of systems, he reports his breathing has improved.  He also reports much less coughing.  He denies any hemoptysis. He denies swallowing difficulties.  For the past week patient reports he is feeling better and has improvement in his appetite.  He does take Remeron.  ALLERGIES:  has No Known Allergies.  Meds: Current Outpatient Medications  Medication Sig Dispense Refill  . albuterol (VENTOLIN HFA) 108 (90 Base) MCG/ACT inhaler Inhale 2 puffs into the lungs every 4 (four) hours as needed for wheezing or shortness of breath. 18 g 0  . amitriptyline (ELAVIL) 75 MG tablet Take 1 tablet (75 mg total) by mouth at bedtime. 90 tablet 3  . amLODipine (NORVASC) 10 MG tablet TAKE 1 TABLET BY MOUTH EVERY DAY 90 tablet 2  . aspirin EC 81 MG tablet Take 81 mg by mouth daily.     Marland Kitchen atorvastatin (LIPITOR) 20 MG tablet Take 1 tablet (20 mg total) by mouth daily. For cholesterol. 90 tablet 0  . folic acid (FOLVITE) 1 MG  tablet Take 1 tablet (1 mg total) by mouth daily. 30 tablet 4  . LYRICA 100 MG capsule Take 100 mg by mouth 2 (two) times daily.   2  . mirtazapine (REMERON) 30 MG tablet Take 30 mg by mouth at bedtime.    . Omega-3 Fatty Acids ([employer] FISH OIL) 1000 MG CAPS TAKE 1 CAPSULE BY MOUTH EVERY DAY WITH A MEAL (Patient taking differently: Take 1,000 mg by mouth daily. TAKE 1 CAPSULE BY MOUTH EVERY DAY WITH A MEAL) 90 capsule 3  . oxyCODONE-acetaminophen (PERCOCET) 10-325 MG per tablet Take 1 tablet by mouth as needed for pain (up to five times daily).     . prochlorperazine (COMPAZINE) 10 MG tablet Take 1 tablet (10 mg total) by mouth every 6 (six) hours as needed for nausea or vomiting. 30 tablet 0  . tiZANidine (ZANAFLEX) 4 MG tablet TAKE 1 TABLET BY MOUTH TWICE A DAY (Patient taking differently: Take 4 mg by mouth 2 (two) times daily. TAKE 1 TABLET BY MOUTH TWICE A DAY) 180 tablet 3  . zolpidem (AMBIEN) 10 MG tablet Take 10 mg by mouth at bedtime as needed.     No current facility-administered medications for this encounter.    Physical Findings: The patient is in no acute distress. Patient is alert and oriented.  height is 5\' 9"  (1.753 m) and weight is 158 lb 6 oz (71.8 kg). His temporal temperature is 97.6 F (36.4 C). His  blood pressure is 110/73 and his pulse is 118 (abnormal). His respiration is 18 and oxygen saturation is 100%.   Lungs are clear to auscultation bilaterally. Heart has regular rate and rhythm. No palpable cervical, supraclavicular, or axillary adenopathy. Abdomen soft, non-tender, normal bowel sounds.   Lab Findings: Lab Results  Component Value Date   WBC 1.5 (L) 12/07/2019   HGB 8.2 (L) 12/07/2019   HCT 26.2 (L) 12/07/2019   MCV 90.0 12/07/2019   PLT 232 12/07/2019    Radiographic Findings: No results found.  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  Coughing and breathing much improved with his treatments thus far.  Plan: The patient is scheduled to undergo restaging CT scans of chest, abdomen, and pelvis on 12/17/2019. He is scheduled to follow-up with Dr. Julien Nordmann on 12/21/2019. He will follow-up with radiation oncology on an as needed basis.   ____________________________________   Blair Promise, PhD, MD  This document serves as a record of services personally performed by Gery Pray, MD. It was created on his behalf by Clerance Lav, a trained medical scribe. The creation of this record is based on the scribe's personal observations and the provider's statements to them. This document has been checked and approved by the attending provider.

## 2019-12-13 ENCOUNTER — Encounter: Payer: Self-pay | Admitting: Radiation Oncology

## 2019-12-13 ENCOUNTER — Ambulatory Visit
Admission: RE | Admit: 2019-12-13 | Discharge: 2019-12-13 | Disposition: A | Discharge status: Home / Self Care | Payer: Medicare Other | Source: Ambulatory Visit | Attending: Radiation Oncology | Admitting: Radiation Oncology

## 2019-12-13 ENCOUNTER — Other Ambulatory Visit: Payer: Self-pay

## 2019-12-13 DIAGNOSIS — D649 Anemia, unspecified: Secondary | ICD-10-CM | POA: Insufficient documentation

## 2019-12-13 DIAGNOSIS — R5383 Other fatigue: Secondary | ICD-10-CM | POA: Diagnosis not present

## 2019-12-13 DIAGNOSIS — C7971 Secondary malignant neoplasm of right adrenal gland: Secondary | ICD-10-CM | POA: Diagnosis not present

## 2019-12-13 DIAGNOSIS — Z923 Personal history of irradiation: Secondary | ICD-10-CM | POA: Insufficient documentation

## 2019-12-13 DIAGNOSIS — Z7982 Long term (current) use of aspirin: Secondary | ICD-10-CM | POA: Insufficient documentation

## 2019-12-13 DIAGNOSIS — R630 Anorexia: Secondary | ICD-10-CM | POA: Insufficient documentation

## 2019-12-13 DIAGNOSIS — C7951 Secondary malignant neoplasm of bone: Secondary | ICD-10-CM | POA: Insufficient documentation

## 2019-12-13 DIAGNOSIS — C7972 Secondary malignant neoplasm of left adrenal gland: Secondary | ICD-10-CM | POA: Insufficient documentation

## 2019-12-13 DIAGNOSIS — C3491 Malignant neoplasm of unspecified part of right bronchus or lung: Secondary | ICD-10-CM | POA: Diagnosis not present

## 2019-12-13 DIAGNOSIS — Z79899 Other long term (current) drug therapy: Secondary | ICD-10-CM | POA: Insufficient documentation

## 2019-12-13 NOTE — Progress Notes (Signed)
Patient here for a 1 month f/u visit with Dr. Sondra Come. Patient denies pain. He has a minimal appetite but try ing to eat and drinking Boost supplements. Patient reports his cough is 80% better. Patient reports  Being short of breath with any exertion. He also reports falling at least once a week at home. He is using a walker and a [employer].  He is on 2 Liter of oxygen at home during the day and night.  BP 110/73 (BP Location: Left Arm, Patient Position: Sitting)   Pulse (!) 118   Temp 97.6 F (36.4 C) (Temporal)   Resp 18   Ht 5\' 9"  (1.753 m)   Wt 158 lb 6 oz (71.8 kg)   SpO2 100%   BMI 23.39 kg/m   Wt Readings from Last 3 Encounters:  12/13/19 158 lb 6 oz (71.8 kg)  11/30/19 161 lb 11.2 oz (73.3 kg)  11/09/19 173 lb 9.6 oz (78.7 kg)

## 2019-12-14 ENCOUNTER — Other Ambulatory Visit: Payer: Medicare Other

## 2019-12-15 ENCOUNTER — Ambulatory Visit: Payer: Medicare Other | Admitting: Internal Medicine

## 2019-12-15 ENCOUNTER — Inpatient Hospital Stay: Payer: Medicare Other

## 2019-12-15 ENCOUNTER — Other Ambulatory Visit: Payer: Self-pay

## 2019-12-15 ENCOUNTER — Ambulatory Visit: Payer: Medicare Other

## 2019-12-15 DIAGNOSIS — Z5111 Encounter for antineoplastic chemotherapy: Secondary | ICD-10-CM | POA: Diagnosis not present

## 2019-12-15 DIAGNOSIS — Z79899 Other long term (current) drug therapy: Secondary | ICD-10-CM | POA: Diagnosis not present

## 2019-12-15 DIAGNOSIS — C7970 Secondary malignant neoplasm of unspecified adrenal gland: Secondary | ICD-10-CM | POA: Diagnosis not present

## 2019-12-15 DIAGNOSIS — C7951 Secondary malignant neoplasm of bone: Secondary | ICD-10-CM | POA: Diagnosis not present

## 2019-12-15 DIAGNOSIS — C3491 Malignant neoplasm of unspecified part of right bronchus or lung: Secondary | ICD-10-CM | POA: Diagnosis not present

## 2019-12-15 DIAGNOSIS — R531 Weakness: Secondary | ICD-10-CM | POA: Diagnosis not present

## 2019-12-15 DIAGNOSIS — R634 Abnormal weight loss: Secondary | ICD-10-CM | POA: Diagnosis not present

## 2019-12-15 DIAGNOSIS — D6481 Anemia due to antineoplastic chemotherapy: Secondary | ICD-10-CM | POA: Diagnosis not present

## 2019-12-15 DIAGNOSIS — T451X5S Adverse effect of antineoplastic and immunosuppressive drugs, sequela: Secondary | ICD-10-CM | POA: Diagnosis not present

## 2019-12-15 LAB — CBC WITH DIFFERENTIAL (CANCER CENTER ONLY)
Abs Immature Granulocytes: 0.14 10*3/uL — ABNORMAL HIGH (ref 0.00–0.07)
Basophils Absolute: 0 10*3/uL (ref 0.0–0.1)
Basophils Relative: 0 %
Eosinophils Absolute: 0 10*3/uL (ref 0.0–0.5)
Eosinophils Relative: 0 %
HCT: 22.3 % — ABNORMAL LOW (ref 39.0–52.0)
Hemoglobin: 7 g/dL — ABNORMAL LOW (ref 13.0–17.0)
Immature Granulocytes: 3 %
Lymphocytes Relative: 6 %
Lymphs Abs: 0.3 10*3/uL — ABNORMAL LOW (ref 0.7–4.0)
MCH: 28.9 pg (ref 26.0–34.0)
MCHC: 31.4 g/dL (ref 30.0–36.0)
MCV: 92.1 fL (ref 80.0–100.0)
Monocytes Absolute: 0.6 10*3/uL (ref 0.1–1.0)
Monocytes Relative: 15 %
Neutro Abs: 3.3 10*3/uL (ref 1.7–7.7)
Neutrophils Relative %: 76 %
Platelet Count: 66 10*3/uL — ABNORMAL LOW (ref 150–400)
RBC: 2.42 MIL/uL — ABNORMAL LOW (ref 4.22–5.81)
RDW: 20.4 % — ABNORMAL HIGH (ref 11.5–15.5)
WBC Count: 4.4 10*3/uL (ref 4.0–10.5)
nRBC: 0 % (ref 0.0–0.2)

## 2019-12-15 LAB — CMP (CANCER CENTER ONLY)
ALT: 30 U/L (ref 0–44)
AST: 32 U/L (ref 15–41)
Albumin: 1.9 g/dL — ABNORMAL LOW (ref 3.5–5.0)
Alkaline Phosphatase: 250 U/L — ABNORMAL HIGH (ref 38–126)
Anion gap: 12 (ref 5–15)
BUN: 19 mg/dL (ref 8–23)
CO2: 24 mmol/L (ref 22–32)
Calcium: 9.6 mg/dL (ref 8.9–10.3)
Chloride: 101 mmol/L (ref 98–111)
Creatinine: 0.8 mg/dL (ref 0.61–1.24)
GFR, Estimated: >60 mL/min (ref >60)
Glucose, Bld: 108 mg/dL — ABNORMAL HIGH (ref 70–99)
Potassium: 3.7 mmol/L (ref 3.5–5.1)
Sodium: 137 mmol/L (ref 135–145)
Total Bilirubin: 0.6 mg/dL (ref 0.3–1.2)
Total Protein: 6.7 g/dL (ref 6.5–8.1)

## 2019-12-17 ENCOUNTER — Other Ambulatory Visit: Payer: Self-pay

## 2019-12-17 ENCOUNTER — Ambulatory Visit (HOSPITAL_COMMUNITY)
Admission: RE | Admit: 2019-12-17 | Discharge: 2019-12-17 | Disposition: A | Discharge status: Home / Self Care | Payer: Medicare Other | Source: Ambulatory Visit | Attending: Internal Medicine | Admitting: Internal Medicine

## 2019-12-17 ENCOUNTER — Encounter (HOSPITAL_COMMUNITY): Payer: Self-pay

## 2019-12-17 DIAGNOSIS — I700 Atherosclerosis of aorta: Secondary | ICD-10-CM | POA: Diagnosis not present

## 2019-12-17 DIAGNOSIS — C7951 Secondary malignant neoplasm of bone: Secondary | ICD-10-CM | POA: Diagnosis not present

## 2019-12-17 DIAGNOSIS — J90 Pleural effusion, not elsewhere classified: Secondary | ICD-10-CM | POA: Diagnosis not present

## 2019-12-17 DIAGNOSIS — C3490 Malignant neoplasm of unspecified part of unspecified bronchus or lung: Secondary | ICD-10-CM | POA: Diagnosis not present

## 2019-12-17 DIAGNOSIS — M898X5 Other specified disorders of bone, thigh: Secondary | ICD-10-CM | POA: Diagnosis not present

## 2019-12-17 DIAGNOSIS — Z85118 Personal history of other malignant neoplasm of bronchus and lung: Secondary | ICD-10-CM | POA: Diagnosis not present

## 2019-12-17 HISTORY — DX: Malignant neoplasm of unspecified part of unspecified bronchus or lung: C34.90

## 2019-12-17 MED ORDER — IOHEXOL 300 MG/ML  SOLN
100.0000 mL | Freq: Once | INTRAMUSCULAR | Status: AC | PRN
  Administered 2019-12-17: 100 mL via INTRAVENOUS

## 2019-12-20 DIAGNOSIS — Z79891 Long term (current) use of opiate analgesic: Secondary | ICD-10-CM | POA: Diagnosis not present

## 2019-12-20 DIAGNOSIS — M47812 Spondylosis without myelopathy or radiculopathy, cervical region: Secondary | ICD-10-CM | POA: Diagnosis not present

## 2019-12-20 DIAGNOSIS — G894 Chronic pain syndrome: Secondary | ICD-10-CM | POA: Diagnosis not present

## 2019-12-20 DIAGNOSIS — M47816 Spondylosis without myelopathy or radiculopathy, lumbar region: Secondary | ICD-10-CM | POA: Diagnosis not present

## 2019-12-21 ENCOUNTER — Inpatient Hospital Stay: Payer: Medicare Other

## 2019-12-21 ENCOUNTER — Other Ambulatory Visit: Payer: Self-pay

## 2019-12-21 ENCOUNTER — Telehealth: Payer: Self-pay

## 2019-12-21 ENCOUNTER — Ambulatory Visit: Payer: Medicare Other | Admitting: Internal Medicine

## 2019-12-21 ENCOUNTER — Ambulatory Visit: Payer: Medicare Other

## 2019-12-21 ENCOUNTER — Inpatient Hospital Stay: Payer: Medicare Other | Admitting: Internal Medicine

## 2019-12-21 ENCOUNTER — Other Ambulatory Visit: Payer: Medicare Other

## 2019-12-21 ENCOUNTER — Encounter: Payer: Self-pay | Admitting: Internal Medicine

## 2019-12-21 VITALS — BP 130/78 | HR 86 | Temp 98.2°F | Resp 18 | Ht 69.0 in | Wt 156.2 lb

## 2019-12-21 DIAGNOSIS — D649 Anemia, unspecified: Secondary | ICD-10-CM

## 2019-12-21 DIAGNOSIS — Z5111 Encounter for antineoplastic chemotherapy: Secondary | ICD-10-CM | POA: Diagnosis not present

## 2019-12-21 DIAGNOSIS — C3491 Malignant neoplasm of unspecified part of right bronchus or lung: Secondary | ICD-10-CM

## 2019-12-21 DIAGNOSIS — D6481 Anemia due to antineoplastic chemotherapy: Secondary | ICD-10-CM

## 2019-12-21 DIAGNOSIS — T451X5A Adverse effect of antineoplastic and immunosuppressive drugs, initial encounter: Secondary | ICD-10-CM

## 2019-12-21 DIAGNOSIS — Z7189 Other specified counseling: Secondary | ICD-10-CM | POA: Diagnosis not present

## 2019-12-21 DIAGNOSIS — T451X5S Adverse effect of antineoplastic and immunosuppressive drugs, sequela: Secondary | ICD-10-CM | POA: Diagnosis not present

## 2019-12-21 DIAGNOSIS — C7970 Secondary malignant neoplasm of unspecified adrenal gland: Secondary | ICD-10-CM | POA: Diagnosis not present

## 2019-12-21 DIAGNOSIS — C7951 Secondary malignant neoplasm of bone: Secondary | ICD-10-CM | POA: Diagnosis not present

## 2019-12-21 DIAGNOSIS — R531 Weakness: Secondary | ICD-10-CM | POA: Diagnosis not present

## 2019-12-21 DIAGNOSIS — I10 Essential (primary) hypertension: Secondary | ICD-10-CM

## 2019-12-21 DIAGNOSIS — R634 Abnormal weight loss: Secondary | ICD-10-CM | POA: Diagnosis not present

## 2019-12-21 DIAGNOSIS — Z79899 Other long term (current) drug therapy: Secondary | ICD-10-CM | POA: Diagnosis not present

## 2019-12-21 LAB — CMP (CANCER CENTER ONLY)
ALT: 15 U/L (ref 0–44)
AST: 21 U/L (ref 15–41)
Albumin: 2 g/dL — ABNORMAL LOW (ref 3.5–5.0)
Alkaline Phosphatase: 194 U/L — ABNORMAL HIGH (ref 38–126)
Anion gap: 13 (ref 5–15)
BUN: 16 mg/dL (ref 8–23)
CO2: 21 mmol/L — ABNORMAL LOW (ref 22–32)
Calcium: 9.3 mg/dL (ref 8.9–10.3)
Chloride: 100 mmol/L (ref 98–111)
Creatinine: 0.84 mg/dL (ref 0.61–1.24)
GFR, Estimated: >60 mL/min (ref >60)
Glucose, Bld: 111 mg/dL — ABNORMAL HIGH (ref 70–99)
Potassium: 3.4 mmol/L — ABNORMAL LOW (ref 3.5–5.1)
Sodium: 134 mmol/L — ABNORMAL LOW (ref 135–145)
Total Bilirubin: 0.6 mg/dL (ref 0.3–1.2)
Total Protein: 6.3 g/dL — ABNORMAL LOW (ref 6.5–8.1)

## 2019-12-21 LAB — PREPARE RBC (CROSSMATCH)

## 2019-12-21 LAB — CBC WITH DIFFERENTIAL (CANCER CENTER ONLY)
Abs Immature Granulocytes: 1.15 10*3/uL — ABNORMAL HIGH (ref 0.00–0.07)
Basophils Absolute: 0 10*3/uL (ref 0.0–0.1)
Basophils Relative: 0 %
Eosinophils Absolute: 0 10*3/uL (ref 0.0–0.5)
Eosinophils Relative: 0 %
HCT: 19.7 % — ABNORMAL LOW (ref 39.0–52.0)
Hemoglobin: 6.2 g/dL — CL (ref 13.0–17.0)
Immature Granulocytes: 10 %
Lymphocytes Relative: 3 %
Lymphs Abs: 0.4 10*3/uL — ABNORMAL LOW (ref 0.7–4.0)
MCH: 28.6 pg (ref 26.0–34.0)
MCHC: 31.5 g/dL (ref 30.0–36.0)
MCV: 90.8 fL (ref 80.0–100.0)
Monocytes Absolute: 3 10*3/uL — ABNORMAL HIGH (ref 0.1–1.0)
Monocytes Relative: 26 %
Neutro Abs: 7 10*3/uL (ref 1.7–7.7)
Neutrophils Relative %: 61 %
Platelet Count: 751 10*3/uL — ABNORMAL HIGH (ref 150–400)
RBC: 2.17 MIL/uL — ABNORMAL LOW (ref 4.22–5.81)
RDW: 21.7 % — ABNORMAL HIGH (ref 11.5–15.5)
WBC Count: 11.6 10*3/uL — ABNORMAL HIGH (ref 4.0–10.5)
nRBC: 0 % (ref 0.0–0.2)

## 2019-12-21 LAB — TSH: TSH: 1.625 u[IU]/mL (ref 0.320–4.118)

## 2019-12-21 MED ORDER — DIPHENHYDRAMINE HCL 25 MG PO CAPS
ORAL_CAPSULE | ORAL | Status: AC
  Filled 2019-12-21: qty 1

## 2019-12-21 MED ORDER — ACETAMINOPHEN 325 MG PO TABS
650.0000 mg | ORAL_TABLET | Freq: Once | ORAL | Status: AC
  Administered 2019-12-21: 650 mg via ORAL

## 2019-12-21 MED ORDER — SODIUM CHLORIDE 0.9% IV SOLUTION
250.0000 mL | Freq: Once | INTRAVENOUS | Status: AC
  Administered 2019-12-21: 250 mL via INTRAVENOUS
  Filled 2019-12-21: qty 250

## 2019-12-21 MED ORDER — ACETAMINOPHEN 325 MG PO TABS
ORAL_TABLET | ORAL | Status: AC
  Filled 2019-12-21: qty 2

## 2019-12-21 MED ORDER — METHYLPREDNISOLONE 4 MG PO TBPK: ORAL_TABLET | ORAL | 0 refills | Status: AC

## 2019-12-21 MED ORDER — DEXAMETHASONE 4 MG PO TABS: ORAL_TABLET | ORAL | 0 refills | Status: AC

## 2019-12-21 MED ORDER — DIPHENHYDRAMINE HCL 25 MG PO CAPS
25.0000 mg | ORAL_CAPSULE | Freq: Once | ORAL | Status: AC
  Administered 2019-12-21: 25 mg via ORAL

## 2019-12-21 NOTE — Patient Instructions (Signed)

## 2019-12-21 NOTE — Progress Notes (Signed)
[facility] [phone]   [phone]  OFFICE PROGRESS NOTE  Pleas Koch, NP [place] [address]  DIAGNOSIS: Stage IV (T4, N3, M1 C) non-small cell lung cancer presented with large right hilar mass in addition to mediastinal lymphadenopathy with bilateral adrenal metastasis and bony metastatic disease diagnosed in July 2021.  PRIOR THERAPY:  Systemic chemotherapy with carboplatin for AUC of 5, Alimta 500 mg/M2 and Keytruda 200 mg IV every 3 weeks.  First dose 10/19/2019.  Status post 3 cycles.  Last dose was given November 30, 2019 discontinued secondary to disease progression.   CURRENT THERAPY: Second line systemic chemotherapy with docetaxel 60 mg/M2 and Cyramza 10 mg/KG every 3 weeks with Neulasta support.  First dose December 28, 2019.  INTERVAL HISTORY: Jerry Wong 74 y.o. male returns to the clinic today for follow-up visit accompanied by his wife.  The patient continues to complain of increasing fatigue and weakness.  He also has baseline shortness of breath but no significant chest pain, cough or hemoptysis.  He denied having any nausea, vomiting, diarrhea or constipation.  He has no fever or chills.  He continues to have lack of appetite and weight loss.  He tolerated the last cycle of his systemic chemotherapy with carboplatin, Alimta and Keytruda fairly well.  The patient is here today for evaluation with repeat CT scan of the chest, abdomen pelvis for restaging of his disease.  MEDICAL HISTORY: Past Medical History:  Diagnosis Date   Arthritis    Cataract    Chickenpox    Chronic headaches    Chronic kidney disease    Chronic neck and back pain    History of kidney stones    Hyperlipidemia    Hypertension    Insomnia    Lung cancer ([place]) dx'd 08/2019   Neuromuscular disorder ([place])    Prostate cancer ([place])     ALLERGIES:  has No Known Allergies.  MEDICATIONS:  Current Outpatient Medications    Medication Sig Dispense Refill   albuterol (VENTOLIN HFA) 108 (90 Base) MCG/ACT inhaler Inhale 2 puffs into the lungs every 4 (four) hours as needed for wheezing or shortness of breath. 18 g 0   amitriptyline (ELAVIL) 75 MG tablet Take 1 tablet (75 mg total) by mouth at bedtime. 90 tablet 3   aspirin EC 81 MG tablet Take 81 mg by mouth daily.      atorvastatin (LIPITOR) 20 MG tablet Take 1 tablet (20 mg total) by mouth daily. For cholesterol. 90 tablet 0   folic acid (FOLVITE) 1 MG tablet Take 1 tablet (1 mg total) by mouth daily. 30 tablet 4   LYRICA 100 MG capsule Take 100 mg by mouth 2 (two) times daily.   2   mirtazapine (REMERON) 30 MG tablet Take 30 mg by mouth at bedtime.     Omega-3 Fatty Acids ([employer] FISH OIL) 1000 MG CAPS TAKE 1 CAPSULE BY MOUTH EVERY DAY WITH A MEAL (Patient taking differently: Take 1,000 mg by mouth daily. TAKE 1 CAPSULE BY MOUTH EVERY DAY WITH A MEAL) 90 capsule 3   oxyCODONE-acetaminophen (PERCOCET) 10-325 MG per tablet Take 1 tablet by mouth as needed for pain (up to five times daily).      prochlorperazine (COMPAZINE) 10 MG tablet Take 1 tablet (10 mg total) by mouth every 6 (six) hours as needed for nausea or vomiting. 30 tablet 0   tiZANidine (ZANAFLEX) 4 MG tablet TAKE 1  TABLET BY MOUTH TWICE A DAY (Patient taking differently: Take 4 mg by mouth 2 (two) times daily. TAKE 1 TABLET BY MOUTH TWICE A DAY) 180 tablet 3   zolpidem (AMBIEN) 10 MG tablet Take 10 mg by mouth at bedtime as needed.     amLODipine (NORVASC) 10 MG tablet TAKE 1 TABLET BY MOUTH EVERY DAY (Patient not taking: Reported on 12/21/2019) 90 tablet 2   No current facility-administered medications for this visit.    SURGICAL HISTORY:  Past Surgical History:  Procedure Laterality Date   BACK SURGERY  1974   BRONCHIAL NEEDLE ASPIRATION BIOPSY  09/06/2019   Procedure: BRONCHIAL NEEDLE ASPIRATION BIOPSIES;  Surgeon: Candee Furbish, MD;  Location: [facility];  Service: Pulmonary;;    BRONCHIAL WASHINGS  09/06/2019   Procedure: BRONCHIAL WASHINGS;  Surgeon: Candee Furbish, MD;  Location: [facility];  Service: Pulmonary;;   NECK SURGERY  2002   PROSTATECTOMY     TONSILLECTOMY  1958   TRIGGER FINGER RELEASE Left 11/20/2017   left thumb/Dr. Apolonio Schneiders   VIDEO BRONCHOSCOPY WITH ENDOBRONCHIAL ULTRASOUND N/A 09/06/2019   Procedure: VIDEO BRONCHOSCOPY WITH ENDOBRONCHIAL ULTRASOUND;  Surgeon: Candee Furbish, MD;  Location: [facility];  Service: Pulmonary;  Laterality: N/A;    REVIEW OF SYSTEMS:  Constitutional: positive for anorexia, fatigue and weight loss Eyes: negative Ears, nose, mouth, throat, and face: negative Respiratory: positive for dyspnea on exertion Cardiovascular: negative Gastrointestinal: negative Genitourinary:negative Integument/breast: negative Hematologic/lymphatic: negative Musculoskeletal:positive for muscle weakness Neurological: negative Behavioral/Psych: negative Endocrine: negative Allergic/Immunologic: negative   PHYSICAL EXAMINATION: General appearance: alert, cooperative, fatigued and no distress Head: Normocephalic, without obvious abnormality, atraumatic Neck: no adenopathy, no JVD, supple, symmetrical, trachea midline and thyroid not enlarged, symmetric, no tenderness/mass/nodules Lymph nodes: Cervical, supraclavicular, and axillary nodes normal. Resp: clear to auscultation bilaterally Back: symmetric, no curvature. ROM normal. No CVA tenderness. Cardio: regular rate and rhythm, S1, S2 normal, no murmur, click, rub or gallop GI: soft, non-tender; bowel sounds normal; no masses,  no organomegaly Extremities: extremities normal, atraumatic, no cyanosis or edema Neurologic: Alert and oriented X 3, normal strength and tone. Normal symmetric reflexes. Normal coordination and gait  ECOG PERFORMANCE STATUS: 1 - Symptomatic but completely ambulatory  Blood pressure 130/78, pulse 86, temperature 98.2 F (36.8 C), temperature source  Tympanic, resp. rate 18, height 5\' 9"  (1.753 m), weight 156 lb 3.2 oz (70.9 kg), SpO2 98 %.  LABORATORY DATA: Lab Results  Component Value Date   WBC 11.6 (H) 12/21/2019   HGB 6.2 (LL) 12/21/2019   HCT 19.7 (L) 12/21/2019   MCV 90.8 12/21/2019   PLT 751 (H) 12/21/2019      Chemistry      Component Value Date/Time   NA 137 12/15/2019 0938   K 3.7 12/15/2019 0938   CL 101 12/15/2019 0938   CO2 24 12/15/2019 0938   BUN 19 12/15/2019 0938   CREATININE 0.80 12/15/2019 0938   CREATININE 1.21 (H) 01/15/2019 1541      Component Value Date/Time   CALCIUM 9.6 12/15/2019 0938   ALKPHOS 250 (H) 12/15/2019 0938   AST 32 12/15/2019 0938   ALT 30 12/15/2019 0938   BILITOT 0.6 12/15/2019 0938       RADIOGRAPHIC STUDIES: CT Chest W Contrast  Result Date: 12/18/2019 CLINICAL DATA:  Restaging non-small cell lung cancer. Status post chemotherapy and XRT. EXAM: CT CHEST, ABDOMEN, AND PELVIS WITH CONTRAST TECHNIQUE: Multidetector CT imaging of the chest, abdomen and pelvis was performed following the standard protocol during  bolus administration of intravenous contrast. CONTRAST:  154mL OMNIPAQUE IOHEXOL 300 MG/ML  SOLN COMPARISON:  PET-CT 09/17/2019 FINDINGS: CT CHEST FINDINGS Cardiovascular: The heart is upper limits of normal in size. No significant pericardial effusion. Aortic atherosclerosis. Coronary artery calcification identified. Mediastinum/Nodes: Normal appearance of the thyroid gland. The trachea appears patent and is midline. Normal appearance of the esophagus. Progression of thoracic adenopathy: Left supraclavicular lymph node measures 1.4 cm, image 11/2. New from previous exam. Right supraclavicular lymph node measures 1.3 cm, image 12/2. Also new from previous exam. Multiple enlarged superior mediastinal and prevascular lymph nodes: Index left pre-vascular lymph node measures 1.1 cm, image 26/2. New from previous exam. Right paratracheal lymph node measures 1 cm, image 19/2. Also new  from previous exam. Previously referenced enlarged subcarinal lymph node measures 0.8 cm, image 39/2. On the prior exam this measured 2.3 cm. Conglomeration of enlarged right hilar lymph nodes measured 2.2 x 1.5 cm on today's exam, image 37/2. Previously 3.8 x 3.1 cm. Lungs/Pleura: New right pleural effusion. The right lower lobe lung mass has decreased in size from previous exam. On today's study this measures 3.2 by 3.7 by 4.1 cm, image 48/2 and image 86/4. On the previous exam using the same measurements scheme this measured 5.5 x 7.7 x 6.5 cm Signs of lymphangitic spread of tumor is again noted within the right lower lobe with interlobular septal thickening and nodularity. Musculoskeletal: Lytic lesion involving the anterolateral aspect of the left second rib measures 1.6 cm. On the previous PET-CT there is a focal area of hypermetabolism corresponding to this lesion without changes on the corresponding CT images. Similarly, there are multiple lytic bone lesions within the thoracic spine which roughly correlate 2 areas of hypermetabolism on previous PET-CT as seen on image 98/5 CT ABDOMEN PELVIS FINDINGS Hepatobiliary: No suspicious subtle, nonspecific area of low attenuation within inferior right hepatic lobe measures 8 mm. This was not confidently noted on previous imaging. Gallbladder unremarkable. Pancreas: Unremarkable. No pancreatic ductal dilatation or surrounding inflammatory changes. Spleen: Normal in size without focal abnormality. 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 Previously 3.3 x 2.1 cm. New right adrenal gland metastasis measures 2.9 x 2.0 cm, image 65/2.Bilateral kidney cysts. No mass or hydronephrosis. Urinary bladder is unremarkable Stomach/Bowel: Stomach is within normal limits. Appendix appears normal. No evidence of bowel wall thickening, distention, or inflammatory changes. Vascular/Lymphatic: Aortic atherosclerosis. No aneurysm. New left  retroperitoneal lymph node measures 3.2 x 1.9 cm, image 77/2. No pelvic or inguinal adenopathy. Reproductive: Signs of previous prostatectomy. Other: No free fluid or fluid collections. Musculoskeletal: Multifocal lytic bone metastases are identified. These are new when compared with the previous imaging including 1 cm lesion within the L2 vertebra as well as a 1.4 cm lesion within L3. Multiple bilateral lytic lesions are noted involving the iliac bone which by CT are new. New lucent lesions are identified within bilateral proximal femurs. IMPRESSION: 1. Mixed response to therapy. Interval improvement in right lower lobe lung mass as well as right hilar and subcarinal adenopathy. Interval development of bilateral mediastinal and supraclavicular adenopathy. There is also new left retroperitoneal adenopathy. 2. 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 New right adrenal gland metastasis. 3. 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 By CT these are new when compared with previous PET-CT from 09/17/2019. 4. New right pleural effusion. 5. Coronary artery calcification. 6. Aortic atherosclerosis. Aortic Atherosclerosis (ICD10-I70.0). Electronically Signed   By: Queen Slough.D.  On: 12/18/2019 12:36   CT Abdomen Pelvis W Contrast  Result Date: 12/18/2019 CLINICAL DATA:  Restaging non-small cell lung cancer. Status post chemotherapy and XRT. EXAM: CT CHEST, ABDOMEN, AND PELVIS WITH CONTRAST TECHNIQUE: Multidetector CT imaging of the chest, abdomen and pelvis was performed following the standard protocol during bolus administration of intravenous contrast. CONTRAST:  179mL OMNIPAQUE IOHEXOL 300 MG/ML  SOLN COMPARISON:  PET-CT 09/17/2019 FINDINGS: CT CHEST FINDINGS Cardiovascular: The heart is upper limits of normal in size. No significant pericardial effusion. Aortic atherosclerosis. Coronary artery calcification identified. Mediastinum/Nodes: Normal appearance of the thyroid gland. The  trachea appears patent and is midline. Normal appearance of the esophagus. Progression of thoracic adenopathy: Left supraclavicular lymph node measures 1.4 cm, image 11/2. New from previous exam. Right supraclavicular lymph node measures 1.3 cm, image 12/2. Also new from previous exam. Multiple enlarged superior mediastinal and prevascular lymph nodes: Index left pre-vascular lymph node measures 1.1 cm, image 26/2. New from previous exam. Right paratracheal lymph node measures 1 cm, image 19/2. Also new from previous exam. Previously referenced enlarged subcarinal lymph node measures 0.8 cm, image 39/2. On the prior exam this measured 2.3 cm. Conglomeration of enlarged right hilar lymph nodes measured 2.2 x 1.5 cm on today's exam, image 37/2. Previously 3.8 x 3.1 cm. Lungs/Pleura: New right pleural effusion. The right lower lobe lung mass has decreased in size from previous exam. On today's study this measures 3.2 by 3.7 by 4.1 cm, image 48/2 and image 86/4. On the previous exam using the same measurements scheme this measured 5.5 x 7.7 x 6.5 cm Signs of lymphangitic spread of tumor is again noted within the right lower lobe with interlobular septal thickening and nodularity. Musculoskeletal: Lytic lesion involving the anterolateral aspect of the left second rib measures 1.6 cm. On the previous PET-CT there is a focal area of hypermetabolism corresponding to this lesion without changes on the corresponding CT images. Similarly, there are multiple lytic bone lesions within the thoracic spine which roughly correlate 2 areas of hypermetabolism on previous PET-CT as seen on image 98/5 CT ABDOMEN PELVIS FINDINGS Hepatobiliary: No suspicious subtle, nonspecific area of low attenuation within inferior right hepatic lobe measures 8 mm. This was not confidently noted on previous imaging. Gallbladder unremarkable. Pancreas: Unremarkable. No pancreatic ductal dilatation or surrounding inflammatory changes. Spleen: Normal in  size without focal abnormality. 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 Previously 3.3 x 2.1 cm. New right adrenal gland metastasis measures 2.9 x 2.0 cm, image 65/2.Bilateral kidney cysts. No mass or hydronephrosis. Urinary bladder is unremarkable Stomach/Bowel: Stomach is within normal limits. Appendix appears normal. No evidence of bowel wall thickening, distention, or inflammatory changes. Vascular/Lymphatic: Aortic atherosclerosis. No aneurysm. New left retroperitoneal lymph node measures 3.2 x 1.9 cm, image 77/2. No pelvic or inguinal adenopathy. Reproductive: Signs of previous prostatectomy. Other: No free fluid or fluid collections. Musculoskeletal: Multifocal lytic bone metastases are identified. These are new when compared with the previous imaging including 1 cm lesion within the L2 vertebra as well as a 1.4 cm lesion within L3. Multiple bilateral lytic lesions are noted involving the iliac bone which by CT are new. New lucent lesions are identified within bilateral proximal femurs. IMPRESSION: 1. Mixed response to therapy. Interval improvement in right lower lobe lung mass as well as right hilar and subcarinal adenopathy. Interval development of bilateral mediastinal and supraclavicular adenopathy. There is also new left retroperitoneal adenopathy. 2. Interval increase and size of left adrenal  gland metastasis. New right adrenal gland metastasis. 3. 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 By CT these are new when compared with previous PET-CT from 09/17/2019. 4. New right pleural effusion. 5. Coronary artery calcification. 6. Aortic atherosclerosis. Aortic Atherosclerosis (ICD10-I70.0). Electronically Signed   By: Kerby Moors M.D.   On: 12/18/2019 12:36    ASSESSMENT AND PLAN: This is a very pleasant 74 years old white male with a stage IV non-small cell lung cancer with large right hilar mass in addition to mediastinal and supraclavicular  lymphadenopathy and metastatic disease to the bone and adrenal glands diagnosed in July 2021. The patient is currently undergoing systemic chemotherapy with carboplatin for AUC of 5, Alimta 500 mg/M2 and Keytruda 200 mg IV every 3 weeks status post 3 cycles started on 10/16/2019.   He tolerated his treatment well except for significant fatigue secondary to chemotherapy-induced anemia. He had repeat CT scan of the chest, abdomen pelvis performed recently.  I personally and independently reviewed the scan images and discussed the results with the patient and his wife. Unfortunately his scan showed evidence for disease progression in multiple areas. I recommended for the patient to discontinue his current treatment with carboplatin, Alimta and Keytruda at this point. I discussed with the patient other treatment options including second line systemic chemotherapy with reduced dose docetaxel 60 mg/M2 and Cyramza 10 mg/KG every 3 weeks with Neulasta support versus palliative care on hospice referral. The patient is interested in proceeding with second line chemotherapy and he is expected to start the first cycle of this treatment next week. I will call his pharmacy with prescription for Decadron 8 mg p.o. twice daily the day before, day of and day after chemotherapy. I discussed with the patient and his wife the adverse effect of this treatment including but not limited to alopecia, myelosuppression, nausea and vomiting, peripheral neuropathy, liver or renal dysfunction as well as the hemorrhagic adverse effect from [place]. The patient will come back for follow-up visit in 2 weeks for evaluation and management of any adverse effect of his treatment. For the chemotherapy-induced anemia, I will arrange for the patient to receive 2 units of PRBCs transfusion today. He was advised to call immediately if he has any concerning symptoms in the interval. The patient voices understanding of current disease status and  treatment options and is in agreement with the current care plan.  All questions were answered. The patient knows to call the clinic with any problems, questions or concerns. We can certainly see the patient much sooner if necessary.   Disclaimer: This note was dictated with voice recognition software. Similar sounding words can inadvertently be transcribed and may not be corrected upon review.

## 2019-12-21 NOTE — Progress Notes (Signed)
DISCONTINUE OFF PATHWAY REGIMEN - Non-Small Cell Lung   [id number]:Pembrolizumab 200 mg  IV D1 + Pemetrexed 500 mg/m2 IV D1 + Carboplatin AUC=5 IV D1 q21 Days:   A cycle is every 21 days:     Pembrolizumab      Pemetrexed      Carboplatin   **Always confirm dose/schedule in your pharmacy ordering system**  REASON: Disease Progression PRIOR TREATMENT: Off Pathway: Pembrolizumab 200 mg  IV D1 + Pemetrexed 500 mg/m2 IV D1 + Carboplatin AUC=5 IV D1 q21 Days TREATMENT RESPONSE: Progressive Disease (PD)  START ON PATHWAY REGIMEN - Non-Small Cell Lung     A cycle is every 21 days:     Ramucirumab      Docetaxel   **Always confirm dose/schedule in your pharmacy ordering system**  Patient Characteristics: Stage IV Metastatic, Nonsquamous, Second Line - Chemotherapy/Immunotherapy, PS = 0, 1, No Prior PD-1/PD-L1  Inhibitor or Prior PD-1/PD-L1 Inhibitor + Chemotherapy, and Not a Candidate for Immunotherapy Therapeutic Status: Stage IV Metastatic Histology: Nonsquamous Cell ROS1 Rearrangement Status: Negative Other Mutations/Biomarkers: No Other Actionable Mutations Chemotherapy/Immunotherapy LOT: Second Line Chemotherapy/Immunotherapy Molecular Targeted Therapy: Not Appropriate KRAS G12C Mutation Status: Negative MET Exon 14 Mutation Status: Negative RET Gene Fusion Status: Negative EGFR Mutation Status: Negative/Wild Type NTRK Gene Fusion Status: Negative PD-L1 Expression Status: PD-L1 Negative ALK Rearrangement Status: Negative BRAF V600E Mutation Status: Negative ECOG Performance Status: 1 Immunotherapy Candidate Status: Not a Candidate for Immunotherapy Prior Immunotherapy Status: Prior PD-1/PD-L1 Inhibitor + Chemotherapy Intent of Therapy: Non-Curative / Palliative Intent, Discussed with Patient

## 2019-12-21 NOTE — Telephone Encounter (Signed)
CRITICAL VALUE STICKER  CRITICAL VALUE: Hgb = 6.2  RECEIVER (on-site recipient of call): Yetta Glassman, [place] NOTIFIED: 12/21/19 at 10:25am  MESSENGER (representative from lab): Pam   MD NOTIFIED: Dr. Julien Nordmann  TIME OF NOTIFICATION: 10:25am  RESPONSE: Pt will need 2 unit of PRBC today

## 2019-12-21 NOTE — Progress Notes (Signed)
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  Past medical history of CKD, HLD, HTN, prostate cancer. Patient receiving chemotherapy, changing treatment due to progression.   Met with patient today in infusion, receiving blood.  Patient reports no appetite. Reports that when he tries to eat he feels like he is going to get sick.  Reports that he is taking nausea medications.  Reports that he is drinking boost shakes about 1 time per day.  Reports that he is able to drink them easier than eating solid foods.    Medications: reviewed   Labs: reviewed   Anthropometrics:   Height: 69 inches Weight: 156 lb 3.2 oz today 213 lb in July 2021 BMI: 23  27% weight loss in the last 3 months, significant   Estimated Energy Needs  Kcals: 2100-2485 Protein: 105-124 g Fluid: > 2.1 L   NUTRITION DIAGNOSIS: Inadequate oral intake related to cancer related treatment side effects as evidenced by 27% weight loss in the last 3 months and poor appetite   INTERVENTION:  Encouraged patient to try boost plus shake (360 calorie) for more calories and protein. Patient feels that he can drink 3-4 per day.  Provided handout on ways to increase calories and protein. Encouraged taking nausea medications  Contact information provided   MONITORING, EVALUATION, GOAL: weight trends, intake   Next Visit: Nov 16 during infusion  Slyvia Lartigue B. Zenia Resides, [place], [place] Registered Dietitian [phone] (mobile)

## 2019-12-22 LAB — TYPE AND SCREEN
ABO/RH(D): A POS
Antibody Screen: NEGATIVE
Unit division: 0
Unit division: 0

## 2019-12-22 LAB — BPAM RBC
Blood Product Expiration Date: 202111182359
Blood Product Expiration Date: 202111182359
ISSUE DATE / TIME: 202110261250
ISSUE DATE / TIME: 202110261250
Unit Type and Rh: 6200
Unit Type and Rh: 6200

## 2019-12-23 ENCOUNTER — Telehealth: Payer: Self-pay | Admitting: Internal Medicine

## 2019-12-23 NOTE — Telephone Encounter (Signed)
Scheduled appt per 10/27 sch msg - pt wife is aware of appts added for 10/29

## 2019-12-24 ENCOUNTER — Other Ambulatory Visit: Payer: Medicare Other

## 2019-12-27 ENCOUNTER — Telehealth: Payer: Self-pay | Admitting: Internal Medicine

## 2019-12-27 NOTE — Telephone Encounter (Signed)
Scheduled per los. Called and spoke with patients wife. Confirmed appts 

## 2019-12-28 ENCOUNTER — Inpatient Hospital Stay: Payer: Medicare Other

## 2019-12-28 ENCOUNTER — Other Ambulatory Visit: Payer: Self-pay | Admitting: Internal Medicine

## 2019-12-28 NOTE — Progress Notes (Signed)
The following biosimilar Udenyca ([id number]) has been selected for use in this patient.  Pharmacist Chemotherapy Monitoring - Initial Assessment    Anticipated start date: 12/28/19   Regimen:   Are orders appropriate based on the patients diagnosis, regimen, and cycle? Yes  Does the plan date match the patients scheduled date? Yes  Is the sequencing of drugs appropriate? Yes  Are the premedications appropriate for the patients regimen? Yes  Prior Authorization for treatment is: Approved o If applicable, is the correct biosimilar selected based on the patient's insurance? yes  Organ Function and Labs:  Are dose adjustments needed based on the patient's renal function, hepatic function, or hematologic function? No  Are appropriate labs ordered prior to the start of patient's treatment? Yes  Other organ system assessment, if indicated: ramucirumab: baseline BP  The following baseline labs, if indicated, have been ordered: ramucirumab: urine protein  Dose Assessment:  Are the drug doses appropriate? Yes  Are the following correct: o Drug concentrations Yes o IV fluid compatible with drug Yes o Administration routes Yes o Timing of therapy Yes  If applicable, does the patient have documented access for treatment and/or plans for port-a-cath placement? no  If applicable, have lifetime cumulative doses been properly documented and assessed? yes Lifetime Dose Tracking   Carboplatin: 1,460 mg = 0.01 % of the maximum lifetime dose of 999,999,999 mg  o   Toxicity Monitoring/Prevention:  The patient has the following take home antiemetics prescribed: Prochlorperazine  The patient has the following take home medications prescribed: N/A  Medication allergies and previous infusion related reactions, if applicable, have been reviewed and addressed. Yes  The patient's current medication list has been assessed for drug-drug interactions with their chemotherapy regimen.  no significant drug-drug interactions were identified on review.  Order Review:  Are the treatment plan orders signed? No  Is the patient scheduled to see a provider prior to their treatment? No  I verify that I have reviewed each item in the above checklist and answered each question accordingly.   Kennith Center, Pharm.D., CPP 12/28/2019@11 :18 AM

## 2019-12-29 ENCOUNTER — Inpatient Hospital Stay: Payer: Medicare Other

## 2019-12-29 ENCOUNTER — Other Ambulatory Visit: Payer: Self-pay

## 2019-12-29 ENCOUNTER — Inpatient Hospital Stay: Payer: Medicare Other | Attending: Internal Medicine

## 2019-12-29 VITALS — BP 117/78 | HR 105 | Temp 97.6°F | Resp 18

## 2019-12-29 DIAGNOSIS — Z5111 Encounter for antineoplastic chemotherapy: Secondary | ICD-10-CM | POA: Diagnosis not present

## 2019-12-29 DIAGNOSIS — Z79899 Other long term (current) drug therapy: Secondary | ICD-10-CM | POA: Diagnosis not present

## 2019-12-29 DIAGNOSIS — C3491 Malignant neoplasm of unspecified part of right bronchus or lung: Secondary | ICD-10-CM

## 2019-12-29 LAB — CBC WITH DIFFERENTIAL (CANCER CENTER ONLY)
Abs Immature Granulocytes: 2.75 10*3/uL — ABNORMAL HIGH (ref 0.00–0.07)
Basophils Absolute: 0.1 10*3/uL (ref 0.0–0.1)
Basophils Relative: 1 %
Eosinophils Absolute: 0 10*3/uL (ref 0.0–0.5)
Eosinophils Relative: 0 %
HCT: 29.9 % — ABNORMAL LOW (ref 39.0–52.0)
Hemoglobin: 9.7 g/dL — ABNORMAL LOW (ref 13.0–17.0)
Immature Granulocytes: 12 %
Lymphocytes Relative: 2 %
Lymphs Abs: 0.6 10*3/uL — ABNORMAL LOW (ref 0.7–4.0)
MCH: 30.1 pg (ref 26.0–34.0)
MCHC: 32.4 g/dL (ref 30.0–36.0)
MCV: 92.9 fL (ref 80.0–100.0)
Monocytes Absolute: 1.6 10*3/uL — ABNORMAL HIGH (ref 0.1–1.0)
Monocytes Relative: 7 %
Neutro Abs: 18.9 10*3/uL — ABNORMAL HIGH (ref 1.7–7.7)
Neutrophils Relative %: 78 %
Platelet Count: 294 10*3/uL (ref 150–400)
RBC: 3.22 MIL/uL — ABNORMAL LOW (ref 4.22–5.81)
RDW: 20.9 % — ABNORMAL HIGH (ref 11.5–15.5)
WBC Count: 23.9 10*3/uL — ABNORMAL HIGH (ref 4.0–10.5)
nRBC: 0 % (ref 0.0–0.2)

## 2019-12-29 LAB — CMP (CANCER CENTER ONLY)
ALT: 12 U/L (ref 0–44)
AST: 16 U/L (ref 15–41)
Albumin: 2 g/dL — ABNORMAL LOW (ref 3.5–5.0)
Alkaline Phosphatase: 192 U/L — ABNORMAL HIGH (ref 38–126)
Anion gap: 11 (ref 5–15)
BUN: 34 mg/dL — ABNORMAL HIGH (ref 8–23)
CO2: 26 mmol/L (ref 22–32)
Calcium: 9.1 mg/dL (ref 8.9–10.3)
Chloride: 103 mmol/L (ref 98–111)
Creatinine: 0.96 mg/dL (ref 0.61–1.24)
GFR, Estimated: >60 mL/min (ref >60)
Glucose, Bld: 143 mg/dL — ABNORMAL HIGH (ref 70–99)
Potassium: 4 mmol/L (ref 3.5–5.1)
Sodium: 140 mmol/L (ref 135–145)
Total Bilirubin: 0.5 mg/dL (ref 0.3–1.2)
Total Protein: 6.3 g/dL — ABNORMAL LOW (ref 6.5–8.1)

## 2019-12-29 MED ORDER — ACETAMINOPHEN 325 MG PO TABS
ORAL_TABLET | ORAL | Status: AC
  Filled 2019-12-29: qty 2

## 2019-12-29 MED ORDER — DIPHENHYDRAMINE HCL 50 MG/ML IJ SOLN
50.0000 mg | Freq: Once | INTRAMUSCULAR | Status: AC
  Administered 2019-12-29: 50 mg via INTRAVENOUS

## 2019-12-29 MED ORDER — DIPHENHYDRAMINE HCL 50 MG/ML IJ SOLN
INTRAMUSCULAR | Status: AC
  Filled 2019-12-29: qty 1

## 2019-12-29 MED ORDER — SODIUM CHLORIDE 0.9 % IV SOLN
10.0000 mg | Freq: Once | INTRAVENOUS | Status: AC
  Administered 2019-12-29: 10 mg via INTRAVENOUS
  Filled 2019-12-29: qty 10

## 2019-12-29 MED ORDER — SODIUM CHLORIDE 0.9 % IV SOLN
Freq: Once | INTRAVENOUS | Status: AC
  Filled 2019-12-29: qty 250

## 2019-12-29 MED ORDER — FAMOTIDINE IN NACL 20-0.9 MG/50ML-% IV SOLN
20.0000 mg | Freq: Once | INTRAVENOUS | Status: AC
  Administered 2019-12-29: 20 mg via INTRAVENOUS

## 2019-12-29 MED ORDER — SODIUM CHLORIDE 0.9 % IV SOLN
60.0000 mg/m2 | Freq: Once | INTRAVENOUS | Status: AC
  Administered 2019-12-29: 110 mg via INTRAVENOUS
  Filled 2019-12-29: qty 11

## 2019-12-29 MED ORDER — SODIUM CHLORIDE 0.9 % IV SOLN
10.0000 mg/kg | Freq: Once | INTRAVENOUS | Status: AC
  Administered 2019-12-29: 700 mg via INTRAVENOUS
  Filled 2019-12-29: qty 50

## 2019-12-29 MED ORDER — ACETAMINOPHEN 325 MG PO TABS
650.0000 mg | ORAL_TABLET | Freq: Once | ORAL | Status: AC
  Administered 2019-12-29: 650 mg via ORAL

## 2019-12-29 NOTE — Patient Instructions (Signed)
[facility] Discharge Instructions for Patients Receiving Chemotherapy  Today you received the following chemotherapy agents: Cyramza, Taxotere  To help prevent nausea and vomiting after your treatment, we encourage you to take your nausea medication as directed.   If you develop nausea and vomiting that is not controlled by your nausea medication, call the clinic.   BELOW ARE SYMPTOMS THAT SHOULD BE REPORTED IMMEDIATELY:  *FEVER GREATER THAN 100.5 F  *CHILLS WITH OR WITHOUT FEVER  NAUSEA AND VOMITING THAT IS NOT CONTROLLED WITH YOUR NAUSEA MEDICATION  *UNUSUAL SHORTNESS OF BREATH  *UNUSUAL BRUISING OR BLEEDING  TENDERNESS IN MOUTH AND THROAT WITH OR WITHOUT PRESENCE OF ULCERS  *URINARY PROBLEMS  *BOWEL PROBLEMS  UNUSUAL RASH Items with * indicate a potential emergency and should be followed up as soon as possible.  Feel free to call the clinic should you have any questions or concerns. The clinic phone number is [phone].  Please show the [facility] at check-in to the Emergency Department and triage nurse.  Ramucirumab injection What is this medicine? RAMUCIRUMAB (ra mue SIR ue mab) is a monoclonal antibody. It is used to treat stomach cancer, colorectal cancer, liver cancer, and lung cancer. This medicine may be used for other purposes; ask your health care provider or pharmacist if you have questions. COMMON BRAND NAME(S): Cyramza What should I tell my health care provider before I take this medicine? They need to know if you have any of these conditions:  bleeding disorders  blood clots  heart disease, including heart failure, heart attack, or chest pain (angina)  high blood pressure  infection (especially a virus infection such as chickenpox, cold sores, or herpes)  protein in your urine  recent or planning to have surgery  stroke  an unusual or allergic reaction to ramucirumab, other medicines, foods, dyes, or  preservatives  pregnant or trying to get pregnant  breast-feeding How should I use this medicine? This medicine is for infusion into a vein. It is given by a health care professional in a hospital or clinic setting. Talk to your pediatrician regarding the use of this medicine in children. Special care may be needed. Overdosage: If you think you have taken too much of this medicine contact a poison control center or emergency room at once. NOTE: This medicine is only for you. Do not share this medicine with others. What if I miss a dose? It is important not to miss your dose. Call your doctor or health care professional if you are unable to keep an appointment. What may interact with this medicine? Interactions have not been studied. This list may not describe all possible interactions. Give your health care provider a list of all the medicines, herbs, non-prescription drugs, or dietary supplements you use. Also tell them if you smoke, drink alcohol, or use illegal drugs. Some items may interact with your medicine. What should I watch for while using this medicine? Your condition will be monitored carefully while you are receiving this medicine. You will need to to check your blood pressure and have your blood and urine tested while you are taking this medicine. Your condition will be monitored carefully while you are receiving this medicine. This medicine may increase your risk to bruise or bleed. Call your doctor or health care professional if you notice any unusual bleeding. Before having surgery, talk to your health care provider to make sure it is ok. This drug can increase the risk of poor healing of your surgical  site or wound. You will need to stop this drug for 28 days before surgery. After surgery, wait at least 2 weeks before restarting this drug. Make sure the surgical site or wound is healed enough before restarting this drug. Talk to your health care provider if questions. Do not  become pregnant while taking this medicine or for 3 months after stopping it. Women should inform their doctor if they wish to become pregnant or think they might be pregnant. There is a potential for serious side effects to an unborn child. Talk to your health care professional or pharmacist for more information. Do not breast-feed an infant while taking this medicine or for 2 months after stopping it. This medicine may interfere with the ability to have a child. Talk with your doctor or health care professional if you are concerned about your fertility. What side effects may I notice from receiving this medicine? Side effects that you should report to your doctor or health care professional as soon as possible:  allergic reactions like skin rash, itching or hives, breathing problems, swelling of the face, lips, or tongue  signs of infection - fever or chills, cough, sore throat  chest pain or chest tightness  confusion  dizziness  feeling faint or lightheaded, falls  severe abdominal pain  severe nausea, vomiting  signs and symptoms of bleeding such as bloody or black, tarry stools; red or dark-brown urine; spitting up blood or brown material that looks like coffee grounds; red spots on the skin; unusual bruising or bleeding from the eye, gums, or nose  signs and symptoms of a blood clot such as breathing problems; changes in vision; chest pain; severe, sudden headache; pain, swelling, warmth in the leg; trouble speaking; sudden numbness or weakness of the face, arm or leg  symptoms of a stroke: change in mental awareness, inability to talk or move one side of the body  trouble walking, dizziness, loss of balance or coordination Side effects that usually do not require medical attention (report to your doctor or health care professional if they continue or are bothersome):  cold, clammy skin  constipation  diarrhea  headache  nausea, vomiting  stomach pain  unusually slow  heartbeat  unusually weak or tired This list may not describe all possible side effects. Call your doctor for medical advice about side effects. You may report side effects to FDA at 1-800-FDA-1088. Where should I keep my medicine? This drug is given in a hospital or clinic and will not be stored at home. NOTE: This sheet is a summary. It may not cover all possible information. If you have questions about this medicine, talk to your doctor, pharmacist, or health care provider.  2020 Elsevier/Gold Standard (2018-12-09 11:17:50)  Docetaxel injection What is this medicine? DOCETAXEL (doe se TAX el) is a chemotherapy drug. It targets fast dividing cells, like cancer cells, and causes these cells to die. This medicine is used to treat many types of cancers like breast cancer, certain stomach cancers, head and neck cancer, lung cancer, and prostate cancer. This medicine may be used for other purposes; ask your health care provider or pharmacist if you have questions. COMMON BRAND NAME(S): Docefrez, Taxotere What should I tell my health care provider before I take this medicine? They need to know if you have any of these conditions:  infection (especially a virus infection such as chickenpox, cold sores, or herpes)  liver disease  low blood counts, like low white cell, platelet, or red cell counts  an unusual or allergic reaction to docetaxel, polysorbate 80, other chemotherapy agents, other medicines, foods, dyes, or preservatives  pregnant or trying to get pregnant  breast-feeding How should I use this medicine? This drug is given as an infusion into a vein. It is administered in a hospital or clinic by a specially trained health care professional. Talk to your pediatrician regarding the use of this medicine in children. Special care may be needed. Overdosage: If you think you have taken too much of this medicine contact a poison control center or emergency room at once. NOTE: This  medicine is only for you. Do not share this medicine with others. What if I miss a dose? It is important not to miss your dose. Call your doctor or health care professional if you are unable to keep an appointment. What may interact with this medicine?  aprepitant  certain antibiotics like erythromycin or clarithromycin  certain antivirals for HIV or hepatitis  certain medicines for fungal infections like fluconazole, itraconazole, ketoconazole, posaconazole, or voriconazole  cimetidine  ciprofloxacin  conivaptan  cyclosporine  dronedarone  fluvoxamine  grapefruit juice  imatinib  verapamil This list may not describe all possible interactions. Give your health care provider a list of all the medicines, herbs, non-prescription drugs, or dietary supplements you use. Also tell them if you smoke, drink alcohol, or use illegal drugs. Some items may interact with your medicine. What should I watch for while using this medicine? Your condition will be monitored carefully while you are receiving this medicine. You will need important blood work done while you are taking this medicine. Call your doctor or health care professional for advice if you get a fever, chills or sore throat, or other symptoms of a cold or flu. Do not treat yourself. This drug decreases your body's ability to fight infections. Try to avoid being around people who are sick. Some products may contain alcohol. Ask your health care professional if this medicine contains alcohol. Be sure to tell all health care professionals you are taking this medicine. Certain medicines, like metronidazole and disulfiram, can cause an unpleasant reaction when taken with alcohol. The reaction includes flushing, headache, nausea, vomiting, sweating, and increased thirst. The reaction can last from 30 minutes to several hours. You may get drowsy or dizzy. Do not drive, use machinery, or do anything that needs mental alertness until you  know how this medicine affects you. Do not stand or sit up quickly, especially if you are an older patient. This reduces the risk of dizzy or fainting spells. Alcohol may interfere with the effect of this medicine. Talk to your health care professional about your risk of cancer. You may be more at risk for certain types of cancer if you take this medicine. Do not become pregnant while taking this medicine or for 6 months after stopping it. Women should inform their doctor if they wish to become pregnant or think they might be pregnant. There is a potential for serious side effects to an unborn child. Talk to your health care professional or pharmacist for more information. Do not breast-feed an infant while taking this medicine or for 1 week after stopping it. Males who get this medicine must use a condom during sex with females who can get pregnant. If you get a woman pregnant, the baby could have birth defects. The baby could die before they are born. You will need to continue wearing a condom for 3 months after stopping the medicine. Tell your health care  provider right away if your partner becomes pregnant while you are taking this medicine. This may interfere with the ability to father a child. You should talk to your doctor or health care professional if you are concerned about your fertility. What side effects may I notice from receiving this medicine? Side effects that you should report to your doctor or health care professional as soon as possible:  allergic reactions like skin rash, itching or hives, swelling of the face, lips, or tongue  blurred vision  breathing problems  changes in vision  low blood counts - This drug may decrease the number of white blood cells, red blood cells and platelets. You may be at increased risk for infections and bleeding.  nausea and vomiting  pain, redness or irritation at site where injected  pain, tingling, numbness in the hands or feet  redness,  blistering, peeling, or loosening of the skin, including inside the mouth  signs of decreased platelets or bleeding - bruising, pinpoint red spots on the skin, black, tarry stools, nosebleeds  signs of decreased red blood cells - unusually weak or tired, fainting spells, lightheadedness  signs of infection - fever or chills, cough, sore throat, pain or difficulty passing urine  swelling of the ankle, feet, hands Side effects that usually do not require medical attention (report to your doctor or health care professional if they continue or are bothersome):  constipation  diarrhea  fingernail or toenail changes  hair loss  loss of appetite  mouth sores  muscle pain This list may not describe all possible side effects. Call your doctor for medical advice about side effects. You may report side effects to FDA at 1-800-FDA-1088. Where should I keep my medicine? This drug is given in a hospital or clinic and will not be stored at home. NOTE: This sheet is a summary. It may not cover all possible information. If you have questions about this medicine, talk to your doctor, pharmacist, or health care provider.  2020 Elsevier/Gold Standard (2018-10-08 10:19:06)

## 2019-12-29 NOTE — Progress Notes (Signed)
Okay to proceed with D1C1 Cyramza, Taxotere wihtout urine protein per Dr. Julien Nordmann. Will collect while patient is here for treatment today for a baseline.  Patient unable to produce a urine sample. So patient will take specimen cup home and return it on his 11/9 lab appt for testing. Dr. Julien Nordmann aware.  At the end of the Cyramza infusion the patient started scratching his hands and stated they felt very itchy, left greater than right. On observation the patient;s hands were red. Gave patient a warm cloth and lotion for his hands and he stated that they felt better. Contacted [employer] PA and administered Pepcid IV per verbal order.

## 2019-12-31 ENCOUNTER — Other Ambulatory Visit: Payer: Self-pay

## 2019-12-31 ENCOUNTER — Inpatient Hospital Stay: Payer: Medicare Other

## 2019-12-31 ENCOUNTER — Other Ambulatory Visit: Payer: Self-pay | Admitting: Primary Care

## 2019-12-31 VITALS — BP 112/86 | HR 107 | Resp 16

## 2019-12-31 DIAGNOSIS — C3491 Malignant neoplasm of unspecified part of right bronchus or lung: Secondary | ICD-10-CM

## 2019-12-31 DIAGNOSIS — I10 Essential (primary) hypertension: Secondary | ICD-10-CM

## 2019-12-31 DIAGNOSIS — Z5111 Encounter for antineoplastic chemotherapy: Secondary | ICD-10-CM | POA: Diagnosis not present

## 2019-12-31 DIAGNOSIS — Z79899 Other long term (current) drug therapy: Secondary | ICD-10-CM | POA: Diagnosis not present

## 2019-12-31 MED ORDER — PEGFILGRASTIM-CBQV 6 MG/0.6ML ~~LOC~~ SOSY
PREFILLED_SYRINGE | SUBCUTANEOUS | Status: AC
  Filled 2019-12-31: qty 0.6

## 2019-12-31 MED ORDER — PEGFILGRASTIM-CBQV 6 MG/0.6ML ~~LOC~~ SOSY
6.0000 mg | PREFILLED_SYRINGE | Freq: Once | SUBCUTANEOUS | Status: AC
  Administered 2019-12-31: 6 mg via SUBCUTANEOUS

## 2019-12-31 NOTE — Patient Instructions (Signed)

## 2020-01-03 ENCOUNTER — Ambulatory Visit: Payer: Medicare Other | Admitting: Primary Care

## 2020-01-04 ENCOUNTER — Inpatient Hospital Stay (HOSPITAL_COMMUNITY)
Admission: EM | Admit: 2020-01-04 | Discharge: 2020-01-26 | DRG: 871 | Disposition: E | Payer: Medicare Other | Attending: Internal Medicine | Admitting: Internal Medicine

## 2020-01-04 ENCOUNTER — Emergency Department (HOSPITAL_COMMUNITY): Payer: Medicare Other

## 2020-01-04 ENCOUNTER — Other Ambulatory Visit: Payer: Medicare Other

## 2020-01-04 ENCOUNTER — Telehealth: Payer: Self-pay | Admitting: Medical Oncology

## 2020-01-04 ENCOUNTER — Encounter (HOSPITAL_COMMUNITY): Payer: Self-pay | Admitting: Emergency Medicine

## 2020-01-04 ENCOUNTER — Inpatient Hospital Stay: Payer: Medicare Other

## 2020-01-04 ENCOUNTER — Inpatient Hospital Stay (HOSPITAL_COMMUNITY): Payer: Medicare Other

## 2020-01-04 DIAGNOSIS — R52 Pain, unspecified: Secondary | ICD-10-CM | POA: Diagnosis not present

## 2020-01-04 DIAGNOSIS — R609 Edema, unspecified: Secondary | ICD-10-CM | POA: Diagnosis not present

## 2020-01-04 DIAGNOSIS — E876 Hypokalemia: Secondary | ICD-10-CM | POA: Diagnosis present

## 2020-01-04 DIAGNOSIS — C7972 Secondary malignant neoplasm of left adrenal gland: Secondary | ICD-10-CM | POA: Diagnosis present

## 2020-01-04 DIAGNOSIS — Z8261 Family history of arthritis: Secondary | ICD-10-CM

## 2020-01-04 DIAGNOSIS — Z9221 Personal history of antineoplastic chemotherapy: Secondary | ICD-10-CM

## 2020-01-04 DIAGNOSIS — R6520 Severe sepsis without septic shock: Secondary | ICD-10-CM | POA: Diagnosis not present

## 2020-01-04 DIAGNOSIS — R0602 Shortness of breath: Secondary | ICD-10-CM | POA: Diagnosis not present

## 2020-01-04 DIAGNOSIS — Z9889 Other specified postprocedural states: Secondary | ICD-10-CM

## 2020-01-04 DIAGNOSIS — Z7189 Other specified counseling: Secondary | ICD-10-CM | POA: Diagnosis not present

## 2020-01-04 DIAGNOSIS — J9809 Other diseases of bronchus, not elsewhere classified: Secondary | ICD-10-CM | POA: Diagnosis not present

## 2020-01-04 DIAGNOSIS — R519 Headache, unspecified: Secondary | ICD-10-CM | POA: Diagnosis not present

## 2020-01-04 DIAGNOSIS — J9601 Acute respiratory failure with hypoxia: Secondary | ICD-10-CM | POA: Diagnosis present

## 2020-01-04 DIAGNOSIS — E440 Moderate protein-calorie malnutrition: Secondary | ICD-10-CM | POA: Insufficient documentation

## 2020-01-04 DIAGNOSIS — A419 Sepsis, unspecified organism: Secondary | ICD-10-CM | POA: Diagnosis not present

## 2020-01-04 DIAGNOSIS — R059 Cough, unspecified: Secondary | ICD-10-CM | POA: Diagnosis not present

## 2020-01-04 DIAGNOSIS — Z515 Encounter for palliative care: Secondary | ICD-10-CM

## 2020-01-04 DIAGNOSIS — R531 Weakness: Secondary | ICD-10-CM | POA: Diagnosis not present

## 2020-01-04 DIAGNOSIS — R64 Cachexia: Secondary | ICD-10-CM | POA: Diagnosis not present

## 2020-01-04 DIAGNOSIS — J9811 Atelectasis: Secondary | ICD-10-CM | POA: Diagnosis present

## 2020-01-04 DIAGNOSIS — Z823 Family history of stroke: Secondary | ICD-10-CM

## 2020-01-04 DIAGNOSIS — Z87442 Personal history of urinary calculi: Secondary | ICD-10-CM

## 2020-01-04 DIAGNOSIS — R0689 Other abnormalities of breathing: Secondary | ICD-10-CM | POA: Diagnosis not present

## 2020-01-04 DIAGNOSIS — D6959 Other secondary thrombocytopenia: Secondary | ICD-10-CM | POA: Diagnosis not present

## 2020-01-04 DIAGNOSIS — Z8249 Family history of ischemic heart disease and other diseases of the circulatory system: Secondary | ICD-10-CM

## 2020-01-04 DIAGNOSIS — G9341 Metabolic encephalopathy: Secondary | ICD-10-CM | POA: Diagnosis present

## 2020-01-04 DIAGNOSIS — Z66 Do not resuscitate: Secondary | ICD-10-CM | POA: Diagnosis not present

## 2020-01-04 DIAGNOSIS — D630 Anemia in neoplastic disease: Secondary | ICD-10-CM | POA: Diagnosis present

## 2020-01-04 DIAGNOSIS — Z9225 Personal history of immunosupression therapy: Secondary | ICD-10-CM

## 2020-01-04 DIAGNOSIS — R590 Localized enlarged lymph nodes: Secondary | ICD-10-CM | POA: Diagnosis not present

## 2020-01-04 DIAGNOSIS — Z801 Family history of malignant neoplasm of trachea, bronchus and lung: Secondary | ICD-10-CM

## 2020-01-04 DIAGNOSIS — G709 Myoneural disorder, unspecified: Secondary | ICD-10-CM | POA: Diagnosis present

## 2020-01-04 DIAGNOSIS — T451X5A Adverse effect of antineoplastic and immunosuppressive drugs, initial encounter: Secondary | ICD-10-CM | POA: Diagnosis present

## 2020-01-04 DIAGNOSIS — I10 Essential (primary) hypertension: Secondary | ICD-10-CM | POA: Diagnosis present

## 2020-01-04 DIAGNOSIS — D6481 Anemia due to antineoplastic chemotherapy: Secondary | ICD-10-CM | POA: Diagnosis present

## 2020-01-04 DIAGNOSIS — R54 Age-related physical debility: Secondary | ICD-10-CM | POA: Diagnosis present

## 2020-01-04 DIAGNOSIS — I499 Cardiac arrhythmia, unspecified: Secondary | ICD-10-CM | POA: Diagnosis not present

## 2020-01-04 DIAGNOSIS — J690 Pneumonitis due to inhalation of food and vomit: Secondary | ICD-10-CM | POA: Diagnosis not present

## 2020-01-04 DIAGNOSIS — I480 Paroxysmal atrial fibrillation: Secondary | ICD-10-CM

## 2020-01-04 DIAGNOSIS — C7951 Secondary malignant neoplasm of bone: Secondary | ICD-10-CM | POA: Diagnosis not present

## 2020-01-04 DIAGNOSIS — E785 Hyperlipidemia, unspecified: Secondary | ICD-10-CM | POA: Diagnosis present

## 2020-01-04 DIAGNOSIS — Z9119 Patient's noncompliance with other medical treatment and regimen: Secondary | ICD-10-CM | POA: Diagnosis not present

## 2020-01-04 DIAGNOSIS — Z800 Family history of malignant neoplasm of digestive organs: Secondary | ICD-10-CM

## 2020-01-04 DIAGNOSIS — M542 Cervicalgia: Secondary | ICD-10-CM | POA: Diagnosis present

## 2020-01-04 DIAGNOSIS — C3490 Malignant neoplasm of unspecified part of unspecified bronchus or lung: Secondary | ICD-10-CM | POA: Diagnosis not present

## 2020-01-04 DIAGNOSIS — Z7901 Long term (current) use of anticoagulants: Secondary | ICD-10-CM

## 2020-01-04 DIAGNOSIS — J90 Pleural effusion, not elsewhere classified: Secondary | ICD-10-CM | POA: Diagnosis not present

## 2020-01-04 DIAGNOSIS — Z743 Need for continuous supervision: Secondary | ICD-10-CM | POA: Diagnosis not present

## 2020-01-04 DIAGNOSIS — J918 Pleural effusion in other conditions classified elsewhere: Secondary | ICD-10-CM | POA: Diagnosis present

## 2020-01-04 DIAGNOSIS — G8929 Other chronic pain: Secondary | ICD-10-CM | POA: Diagnosis present

## 2020-01-04 DIAGNOSIS — I4891 Unspecified atrial fibrillation: Secondary | ICD-10-CM | POA: Diagnosis present

## 2020-01-04 DIAGNOSIS — H269 Unspecified cataract: Secondary | ICD-10-CM | POA: Diagnosis present

## 2020-01-04 DIAGNOSIS — M1990 Unspecified osteoarthritis, unspecified site: Secondary | ICD-10-CM | POA: Diagnosis not present

## 2020-01-04 DIAGNOSIS — I517 Cardiomegaly: Secondary | ICD-10-CM | POA: Diagnosis not present

## 2020-01-04 DIAGNOSIS — C7971 Secondary malignant neoplasm of right adrenal gland: Secondary | ICD-10-CM | POA: Diagnosis not present

## 2020-01-04 DIAGNOSIS — Z20822 Contact with and (suspected) exposure to covid-19: Secondary | ICD-10-CM | POA: Diagnosis present

## 2020-01-04 DIAGNOSIS — Z83438 Family history of other disorder of lipoprotein metabolism and other lipidemia: Secondary | ICD-10-CM

## 2020-01-04 DIAGNOSIS — L89223 Pressure ulcer of left hip, stage 3: Secondary | ICD-10-CM | POA: Diagnosis present

## 2020-01-04 DIAGNOSIS — Z7982 Long term (current) use of aspirin: Secondary | ICD-10-CM

## 2020-01-04 DIAGNOSIS — C3491 Malignant neoplasm of unspecified part of right bronchus or lung: Secondary | ICD-10-CM | POA: Diagnosis present

## 2020-01-04 DIAGNOSIS — Z6821 Body mass index (BMI) 21.0-21.9, adult: Secondary | ICD-10-CM

## 2020-01-04 DIAGNOSIS — L8990 Pressure ulcer of unspecified site, unspecified stage: Secondary | ICD-10-CM | POA: Insufficient documentation

## 2020-01-04 DIAGNOSIS — R627 Adult failure to thrive: Secondary | ICD-10-CM | POA: Diagnosis present

## 2020-01-04 DIAGNOSIS — I776 Arteritis, unspecified: Secondary | ICD-10-CM | POA: Diagnosis present

## 2020-01-04 DIAGNOSIS — M549 Dorsalgia, unspecified: Secondary | ICD-10-CM | POA: Diagnosis present

## 2020-01-04 DIAGNOSIS — Z8546 Personal history of malignant neoplasm of prostate: Secondary | ICD-10-CM

## 2020-01-04 DIAGNOSIS — Z9981 Dependence on supplemental oxygen: Secondary | ICD-10-CM | POA: Diagnosis not present

## 2020-01-04 DIAGNOSIS — R846 Abnormal cytological findings in specimens from respiratory organs and thorax: Secondary | ICD-10-CM | POA: Diagnosis not present

## 2020-01-04 DIAGNOSIS — Z85118 Personal history of other malignant neoplasm of bronchus and lung: Secondary | ICD-10-CM | POA: Diagnosis not present

## 2020-01-04 DIAGNOSIS — Z87891 Personal history of nicotine dependence: Secondary | ICD-10-CM

## 2020-01-04 DIAGNOSIS — Z8042 Family history of malignant neoplasm of prostate: Secondary | ICD-10-CM

## 2020-01-04 DIAGNOSIS — R600 Localized edema: Secondary | ICD-10-CM | POA: Diagnosis present

## 2020-01-04 DIAGNOSIS — Z79899 Other long term (current) drug therapy: Secondary | ICD-10-CM

## 2020-01-04 DIAGNOSIS — R109 Unspecified abdominal pain: Secondary | ICD-10-CM | POA: Diagnosis not present

## 2020-01-04 LAB — RAPID URINE DRUG SCREEN, HOSP PERFORMED
Amphetamines: NOT DETECTED
Barbiturates: NOT DETECTED
Benzodiazepines: NOT DETECTED
Cocaine: NOT DETECTED
Opiates: POSITIVE — AB
Tetrahydrocannabinol: NOT DETECTED

## 2020-01-04 LAB — COMPREHENSIVE METABOLIC PANEL
ALT: 62 U/L — ABNORMAL HIGH (ref 0–44)
AST: 62 U/L — ABNORMAL HIGH (ref 15–41)
Albumin: 2.3 g/dL — ABNORMAL LOW (ref 3.5–5.0)
Alkaline Phosphatase: 212 U/L — ABNORMAL HIGH (ref 38–126)
Anion gap: 14 (ref 5–15)
BUN: 51 mg/dL — ABNORMAL HIGH (ref 8–23)
CO2: 20 mmol/L — ABNORMAL LOW (ref 22–32)
Calcium: 8.9 mg/dL (ref 8.9–10.3)
Chloride: 109 mmol/L (ref 98–111)
Creatinine, Ser: 1.11 mg/dL (ref 0.61–1.24)
GFR, Estimated: >60 mL/min (ref >60)
Glucose, Bld: 131 mg/dL — ABNORMAL HIGH (ref 70–99)
Potassium: 4.8 mmol/L (ref 3.5–5.1)
Sodium: 143 mmol/L (ref 135–145)
Total Bilirubin: 1.3 mg/dL — ABNORMAL HIGH (ref 0.3–1.2)
Total Protein: 6.9 g/dL (ref 6.5–8.1)

## 2020-01-04 LAB — CBC WITH DIFFERENTIAL/PLATELET
Abs Immature Granulocytes: 0.3 10*3/uL — ABNORMAL HIGH (ref 0.00–0.07)
Basophils Absolute: 0.1 10*3/uL (ref 0.0–0.1)
Basophils Relative: 1 %
Eosinophils Absolute: 0.1 10*3/uL (ref 0.0–0.5)
Eosinophils Relative: 1 %
HCT: 32.8 % — ABNORMAL LOW (ref 39.0–52.0)
Hemoglobin: 10 g/dL — ABNORMAL LOW (ref 13.0–17.0)
Lymphocytes Relative: 8 %
Lymphs Abs: 0.6 10*3/uL — ABNORMAL LOW (ref 0.7–4.0)
MCH: 30 pg (ref 26.0–34.0)
MCHC: 30.5 g/dL (ref 30.0–36.0)
MCV: 98.5 fL (ref 80.0–100.0)
Metamyelocytes Relative: 3 %
Monocytes Absolute: 0.4 10*3/uL (ref 0.1–1.0)
Monocytes Relative: 5 %
Myelocytes: 1 %
Neutro Abs: 6.2 10*3/uL (ref 1.7–7.7)
Neutrophils Relative %: 81 %
Platelets: 106 10*3/uL — ABNORMAL LOW (ref 150–400)
RBC: 3.33 MIL/uL — ABNORMAL LOW (ref 4.22–5.81)
RDW: 22.5 % — ABNORMAL HIGH (ref 11.5–15.5)
WBC: 7.7 10*3/uL (ref 4.0–10.5)
nRBC: 0.3 % — ABNORMAL HIGH (ref 0.0–0.2)

## 2020-01-04 LAB — PROTIME-INR
INR: 1.3 — ABNORMAL HIGH (ref 0.8–1.2)
Prothrombin Time: 15.8 seconds — ABNORMAL HIGH (ref 11.4–15.2)

## 2020-01-04 LAB — BLOOD GAS, ARTERIAL
Acid-base deficit: 2.6 mmol/L — ABNORMAL HIGH (ref 0.0–2.0)
Bicarbonate: 19.5 mmol/L — ABNORMAL LOW (ref 20.0–28.0)
Drawn by: 270211
FIO2: 100
O2 Saturation: 99.2 %
Patient temperature: 98
pCO2 arterial: 25.7 mmHg — ABNORMAL LOW (ref 32.0–48.0)
pH, Arterial: 7.492 — ABNORMAL HIGH (ref 7.350–7.450)
pO2, Arterial: 214 mmHg — ABNORMAL HIGH (ref 83.0–108.0)

## 2020-01-04 LAB — URINALYSIS, ROUTINE W REFLEX MICROSCOPIC
Bilirubin Urine: NEGATIVE
Glucose, UA: NEGATIVE mg/dL
Ketones, ur: NEGATIVE mg/dL
Leukocytes,Ua: NEGATIVE
Nitrite: NEGATIVE
Protein, ur: 30 mg/dL — AB
Specific Gravity, Urine: 1.02 (ref 1.005–1.030)
pH: 5 (ref 5.0–8.0)

## 2020-01-04 LAB — BODY FLUID CELL COUNT WITH DIFFERENTIAL
Eos, Fluid: 0 %
Lymphs, Fluid: 8 %
Monocyte-Macrophage-Serous Fluid: 3 % — ABNORMAL LOW (ref 50–90)
Neutrophil Count, Fluid: 89 % — ABNORMAL HIGH (ref 0–25)
Total Nucleated Cell Count, Fluid: 905 cu mm (ref 0–1000)

## 2020-01-04 LAB — GLUCOSE, PLEURAL OR PERITONEAL FLUID: Glucose, Fluid: 137 mg/dL

## 2020-01-04 LAB — LACTIC ACID, PLASMA
Lactic Acid, Venous: 2.9 mmol/L (ref 0.5–1.9)
Lactic Acid, Venous: 3.4 mmol/L (ref 0.5–1.9)
Lactic Acid, Venous: 3.7 mmol/L (ref 0.5–1.9)

## 2020-01-04 LAB — RESPIRATORY PANEL BY RT PCR (FLU A&B, COVID)
Influenza A by PCR: NEGATIVE
Influenza B by PCR: NEGATIVE
SARS Coronavirus 2 by RT PCR: NEGATIVE

## 2020-01-04 LAB — LACTATE DEHYDROGENASE, PLEURAL OR PERITONEAL FLUID: LD, Fluid: 258 U/L — ABNORMAL HIGH (ref 3–23)

## 2020-01-04 LAB — PROTEIN, PLEURAL OR PERITONEAL FLUID: Total protein, fluid: <3 g/dL

## 2020-01-04 LAB — MRSA PCR SCREENING: MRSA by PCR: NEGATIVE

## 2020-01-04 LAB — APTT: aPTT: 33 seconds (ref 24–36)

## 2020-01-04 MED ORDER — LACTATED RINGERS IV BOLUS (SEPSIS)
1000.0000 mL | Freq: Once | INTRAVENOUS | Status: AC
  Administered 2020-01-04: 1000 mL via INTRAVENOUS

## 2020-01-04 MED ORDER — SODIUM CHLORIDE (PF) 0.9 % IJ SOLN
INTRAMUSCULAR | Status: AC
  Filled 2020-01-04: qty 50

## 2020-01-04 MED ORDER — SODIUM CHLORIDE 0.9 % IV SOLN
2.0000 g | Freq: Two times a day (BID) | INTRAVENOUS | Status: DC
  Administered 2020-01-04 – 2020-01-05 (×2): 2 g via INTRAVENOUS
  Filled 2020-01-04 (×2): qty 2

## 2020-01-04 MED ORDER — SODIUM CHLORIDE 0.9 % IV BOLUS
1000.0000 mL | Freq: Once | INTRAVENOUS | Status: AC
  Administered 2020-01-04: 1000 mL via INTRAVENOUS

## 2020-01-04 MED ORDER — SODIUM CHLORIDE 0.9 % IV SOLN: 1.0000 g | Freq: Once | INTRAVENOUS | Status: DC

## 2020-01-04 MED ORDER — CHLORHEXIDINE GLUCONATE 0.12 % MT SOLN
15.0000 mL | Freq: Two times a day (BID) | OROMUCOSAL | Status: DC
  Administered 2020-01-04 – 2020-01-11 (×12): 15 mL via OROMUCOSAL
  Filled 2020-01-04 (×7): qty 15

## 2020-01-04 MED ORDER — VANCOMYCIN HCL IN DEXTROSE 1-5 GM/200ML-% IV SOLN: 1000.0000 mg | Freq: Once | INTRAVENOUS | Status: DC

## 2020-01-04 MED ORDER — VANCOMYCIN HCL 1250 MG/250ML IV SOLN
1250.0000 mg | Freq: Once | INTRAVENOUS | Status: AC
  Administered 2020-01-04: 1250 mg via INTRAVENOUS
  Filled 2020-01-04: qty 250

## 2020-01-04 MED ORDER — POLYETHYLENE GLYCOL 3350 17 G PO PACK: 17.0000 g | PACK | Freq: Every day | ORAL | Status: DC | PRN

## 2020-01-04 MED ORDER — LACTATED RINGERS IV BOLUS (SEPSIS)
250.0000 mL | Freq: Once | INTRAVENOUS | Status: AC
  Administered 2020-01-04: 250 mL via INTRAVENOUS

## 2020-01-04 MED ORDER — ALBUMIN HUMAN 5 % IV SOLN
25.0000 g | Freq: Once | INTRAVENOUS | Status: AC
  Administered 2020-01-04: 25 g via INTRAVENOUS
  Filled 2020-01-04: qty 500

## 2020-01-04 MED ORDER — LACTATED RINGERS IV SOLN: INTRAVENOUS | Status: DC

## 2020-01-04 MED ORDER — SODIUM CHLORIDE 0.9 % IV SOLN: 500.0000 mg | Freq: Once | INTRAVENOUS | Status: DC

## 2020-01-04 MED ORDER — IOHEXOL 300 MG/ML  SOLN
100.0000 mL | Freq: Once | INTRAMUSCULAR | Status: AC | PRN
  Administered 2020-01-04: 100 mL via INTRAVENOUS

## 2020-01-04 MED ORDER — IPRATROPIUM-ALBUTEROL 0.5-2.5 (3) MG/3ML IN SOLN: 3.0000 mL | RESPIRATORY_TRACT | Status: DC | PRN

## 2020-01-04 MED ORDER — METRONIDAZOLE IN NACL 5-0.79 MG/ML-% IV SOLN
500.0000 mg | Freq: Once | INTRAVENOUS | Status: AC
  Administered 2020-01-04: 500 mg via INTRAVENOUS
  Filled 2020-01-04: qty 100

## 2020-01-04 MED ORDER — SODIUM CHLORIDE 0.9 % IV SOLN
2.0000 g | Freq: Once | INTRAVENOUS | Status: AC
  Administered 2020-01-04: 2 g via INTRAVENOUS
  Filled 2020-01-04: qty 2

## 2020-01-04 MED ORDER — CHLORHEXIDINE GLUCONATE CLOTH 2 % EX PADS
6.0000 | MEDICATED_PAD | Freq: Every day | CUTANEOUS | Status: DC
  Administered 2020-01-04 – 2020-01-11 (×8): 6 via TOPICAL

## 2020-01-04 MED ORDER — ORAL CARE MOUTH RINSE
15.0000 mL | Freq: Two times a day (BID) | OROMUCOSAL | Status: DC
  Administered 2020-01-05 – 2020-01-11 (×10): 15 mL via OROMUCOSAL

## 2020-01-04 MED ORDER — DILTIAZEM HCL-DEXTROSE 125-5 MG/125ML-% IV SOLN (PREMIX)
5.0000 mg/h | INTRAVENOUS | Status: DC
  Administered 2020-01-04: 5 mg/h via INTRAVENOUS
  Administered 2020-01-05: 2.5 mg/h via INTRAVENOUS
  Administered 2020-01-05: 15 mg/h via INTRAVENOUS
  Administered 2020-01-07: 5 mg/h via INTRAVENOUS
  Filled 2020-01-04 (×4): qty 125

## 2020-01-04 MED ORDER — ENOXAPARIN SODIUM 40 MG/0.4ML ~~LOC~~ SOLN
40.0000 mg | SUBCUTANEOUS | Status: DC
  Administered 2020-01-04: 40 mg via SUBCUTANEOUS
  Filled 2020-01-04: qty 0.4

## 2020-01-04 MED ORDER — DOCUSATE SODIUM 100 MG PO CAPS: 100.0000 mg | ORAL_CAPSULE | Freq: Two times a day (BID) | ORAL | Status: DC | PRN

## 2020-01-04 NOTE — Telephone Encounter (Signed)
Hard to arouse-Sleeping a lot . Hard to arouse today . He got up yesterday the nwent right back to sleep .these symptoms started after his injection 01/25/20. I spoke to pt but he was incoherent .   Recent medications- He takes all 3 of these every night: Zanaflex -last dose at 9 pm  Ambien last does 9 pm Remeron -last dose 9 pm  Told wife to call 911-

## 2020-01-04 NOTE — Progress Notes (Signed)
Notified bedside nurse of need to draw repeat lactic acid. 

## 2020-01-04 NOTE — Progress Notes (Signed)
[facility] Progress Note Patient Name: Jerry Wong DOB: 1946-02-15 MRN: [id number]   Date of Service  01/06/2020  HPI/Events of Note  Afib with RVR, patient pulling off his BIPAP mask.  eICU Interventions  Cardizem infusion started for Afib, Will try patient off BIPAP since he is s/p thoracentesis and current saturation is 100 %.        Kerry Kass [name] 01/10/2020, 10:40 PM

## 2020-01-04 NOTE — ED Provider Notes (Signed)
Patient signed to me by Dr. Jeanell Sparrow pending completion of his work-up.  Patient's head CT as well as chest abdominal CT results noted.  Patient's respiratory rate is increased as well as his heart rate.  He is still maintaining appropriately.  Due to patient's acute on compensation, will get critical care on board and will see patient   Lacretia Leigh, MD 01/11/2020 1755

## 2020-01-04 NOTE — Progress Notes (Signed)
Notified bedside nurse of need to draw repeat lactic acid @ 1530, also do not see results of first lactate.

## 2020-01-04 NOTE — Sepsis Progress Note (Addendum)
Notified provider of need to order repeat lactic acid d/t increase.    Albumin and repeat lactic acid ordered.  Will s/o Code Sepsis tracking

## 2020-01-04 NOTE — Procedures (Signed)
Thoracentesis  Procedure Note  Jerry Wong  [id number]  03-Mar-1945  Date:01/14/2020  Time:7:11 PM   Provider Performing:[name]   Procedure: Thoracentesis with imaging guidance (50354)  Indication(s) Pleural Effusion  Consent Risks of the procedure as well as the alternatives and risks of each were explained to the patient and/or caregiver.  Consent for the procedure was obtained and is signed in the bedside chart Discussed with the spouse at bedside  Anesthesia Topical only with 1% lidocaine    Time Out Verified patient identification, verified procedure, site/side was marked, verified correct patient position, special equipment/implants available, medications/allergies/relevant history reviewed, required imaging and test results available.   Sterile Technique Maximal sterile technique including full sterile barrier drape, hand hygiene, sterile gown, sterile gloves, mask, hair covering, sterile ultrasound probe cover (if used).  Procedure Description Ultrasound was used to identify appropriate pleural anatomy for placement and overlying skin marked.  Area of drainage cleaned and draped in sterile fashion. Lidocaine was used to anesthetize the skin and subcutaneous tissue.  1000 cc's of yellow appearing fluid was drained from the right pleural space. Catheter then removed and bandaid applied to site.   Complications/Tolerance None; patient tolerated the procedure well. Chest X-ray is ordered to confirm no post-procedural complication.   EBL None   Specimen(s) Pleural fluid

## 2020-01-04 NOTE — Progress Notes (Signed)
[facility] lab down, having to send labs to [facility], labs delayed

## 2020-01-04 NOTE — Progress Notes (Signed)
Following for code sepsis 

## 2020-01-04 NOTE — ED Notes (Signed)
ED Provider at bedside. 

## 2020-01-04 NOTE — Progress Notes (Addendum)
[facility] Progress Note Patient Name: Jerry Wong DOB: 22-Oct-1945 MRN: [id number]   Date of Service  01/02/2020  HPI/Events of Note  Patient with stage 4 lung cancer and pleural effusion  S/p thoracentesis. Patient admitted on BIPAP post-procedure. Lactate 3.4.  eICU Interventions  New Patient Evaluation completed. Albumin 5 % 500 ml iv bolus x 1, trend lactic acid.        Jerry Wong 01/23/2020, 9:13 PM

## 2020-01-04 NOTE — ED Triage Notes (Signed)
Pt presents via [facility] after having stage 4 lung CA and active treatment. Pt has been sleeping more and slow to respond the last 3-4 days. 82% on RA. Does not tolerate home O2 well. 99% on NRB. Clear lung sounds. Alert and oriented.

## 2020-01-04 NOTE — ED Notes (Signed)
Intensivist at bedside.

## 2020-01-04 NOTE — ED Notes (Signed)
Condom catheter applied to patient

## 2020-01-04 NOTE — H&P (Signed)
NAME:  Jerry Wong, MRN:  [id number], DOB:  1945-05-27, LOS: 0 ADMISSION DATE:  01/21/2020, CONSULTATION DATE: 01/07/2020 REFERRING MD: Dr. 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 Arthritis   . Cataract   . Chickenpox   . Chronic headaches   . Chronic kidney disease   . Chronic neck and back pain   . History of kidney stones   . Hyperlipidemia   . Hypertension   . Insomnia   . Lung cancer ([place]) dx'd 08/2019  . Neuromuscular disorder ([place])   . Prostate cancer ([place])    Significant Hospital Events   Thoracentesis 12/27/2019 for 1 L of clear yellow fluid  Consults:  Pulmonary critical care  Procedures:  Thoracentesis 01/09/2020  Significant Diagnostic Tests:  Chest x-ray with large right pleural effusion CT scan with large pleural effusion and lung mass  Micro Data:  Blood cultures done  Antimicrobials:  Cefepime 01/13/2020>> Vancomycin 01/03/2020>>  Interim history/subjective:  Patient with worsening shortness of breath, altered mentation  Objective   Blood pressure 121/90, pulse (!) 142, temperature 99.1 F (37.3 C), temperature source Rectal, resp. rate (!) 32, height 5\' 9"  (1.753 m), weight 68 kg, SpO2 100 %.        Intake/Output Summary (Last 24 hours) at 01/19/2020 1843 Last data filed at 01/08/2020 1807 Gross per 24 hour  Intake 3647 ml  Output --  Net 3647 ml   Filed Weights   01/22/2020 1454  Weight: 68 kg    Examination: General: Elderly gentleman, emanciated HENT: Dry oral mucosa, has mask in place Lungs: Decreased air  entry bilaterally but worse on the right Cardiovascular: S1-S2 appreciated, tachycardic Abdomen: Bowel sounds appreciated Extremities: No clubbing, mild edema Neuro: Alert and oriented to person GU:   Resolved Hospital Problem list     Assessment & Plan:  Acute hypoxic respiratory failure -Multifactorial -Left pleural effusion -Lung mass -Sepsis -Continue BiPAP support -May wean off as tolerated  Sepsis -Recently on chemotherapy and steroids -Continue current antibiotics cefepime and vancomycin  Metastatic lung cancer -On chemotherapy  Large right pleural effusion -Thoracentesis performed in the ED for 1 L of clear serous fluid -Sent for analysis -Postthoracentesis chest x-rays did show some improvement  Goals of care discussion with spouse -Patient is made DNR and DNI  Patient noted to be having apneic episodes on BiPAP -We will hold off on any sedating medications at present though will need his chronic pain medications for chronic back pain He apparently has needed to take Zanaflex, Ambien, Remeron at night   Best practice:  Diet: N.p.o. Pain/Anxiety/Delirium protocol (if indicated): Initiate pain medications when more stable VAP protocol (if indicated): Not needed DVT prophylaxis: Lovenox GI prophylaxis: None Glucose control:  Mobility: Bedrest Code Status: DNI/DNR Family Communication: Spoke to spouse at bedside Disposition:   Labs   CBC: Recent Labs  Lab 12/29/19 1228 01/24/2020 1330  WBC 23.9* 7.7  NEUTROABS 18.9* 6.2  HGB 9.7* 10.0*  HCT 29.9* 32.8*  MCV 92.9 98.5  PLT 294 106*    Basic Metabolic Panel: Recent  Labs  Lab 12/29/19 1228 01/09/2020 1330  NA 140 143  K 4.0 4.8  CL 103 109  CO2 26 20*  GLUCOSE 143* 131*  BUN 34* 51*  CREATININE 0.96 1.11  CALCIUM 9.1 8.9   GFR: Estimated Creatinine Clearance: 57.9 mL/min (by C-G formula based on SCr of 1.11 mg/dL). Recent Labs  Lab 12/29/19 1228 01/02/2020 1330  WBC 23.9* 7.7   LATICACIDVEN  --  2.9*    Liver Function Tests: Recent Labs  Lab 12/29/19 1228 01/13/2020 1330  AST 16 62*  ALT 12 62*  ALKPHOS 192* 212*  BILITOT 0.5 1.3*  PROT 6.3* 6.9  ALBUMIN 2.0* 2.3*   No results for input(s): LIPASE, AMYLASE in the last 168 hours. No results for input(s): AMMONIA in the last 168 hours.  ABG    Component Value Date/Time   PHART 7.492 (H) 01/03/2020 1317   PCO2ART 25.7 (L) 01/23/2020 1317   PO2ART 214 (H) 01/15/2020 1317   HCO3 19.5 (L) 01/13/2020 1317   ACIDBASEDEF 2.6 (H) 01/22/2020 1317   O2SAT 99.2 01/03/2020 1317     Coagulation Profile: Recent Labs  Lab 01/15/2020 1330  INR 1.3*    Cardiac Enzymes: No results for input(s): CKTOTAL, CKMB, CKMBINDEX, TROPONINI in the last 168 hours.  HbA1C: Hgb A1c MFr Bld  Date/Time Value Ref Range Status  12/25/2018 12:20 PM 6.2 4.6 - 6.5 % Final    Comment:    Glycemic Control Guidelines for People with Diabetes:Non Diabetic:  <6%Goal of Therapy: <7%Additional Action Suggested:  >8%   12/22/2017 01:03 PM 6.1 4.6 - 6.5 % Final    Comment:    Glycemic Control Guidelines for People with Diabetes:Non Diabetic:  <6%Goal of Therapy: <7%Additional Action Suggested:  >8%     CBG: No results for input(s): GLUCAP in the last 168 hours.  Review of Systems:   Very frail, weakness  Past Medical History  He,  has a past medical history of Arthritis, Cataract, Chickenpox, Chronic headaches, Chronic kidney disease, Chronic neck and back pain, History of kidney stones, Hyperlipidemia, Hypertension, Insomnia, Lung cancer ([place]) (dx'd 08/2019), Neuromuscular disorder ([place]), and Prostate cancer ([place]).   Surgical History    Past Surgical History:  Procedure Laterality Date  . BACK SURGERY  1974  . BRONCHIAL NEEDLE ASPIRATION BIOPSY  09/06/2019   Procedure: BRONCHIAL NEEDLE ASPIRATION BIOPSIES;  Surgeon: Candee Furbish, MD;  Location[facility] END[facility]COPY;  Service: Pulmonary;;  . BRONCHIAL WASHINGS  09/06/2019    Procedure: BRONCHIAL WASHINGS;  Surgeon: Candee Furbish, MD;  Location: [facility] ENDOSCOPY;  Service: Pulmonary;;  . NECK SURGERY  2002  . PROSTATECTOMY    . TONSILLECTOMY  1958  . TRIGGER FINGER RELEASE Left 11/20/2017   left thumb/Dr. Apolonio Schneiders  . VIDEO BRONCHOSCOPY WITH ENDOBRONCHIAL ULTRASOUND N/A 09/06/2019   Procedure: VIDEO BRONCHOSCOPY WITH ENDOBRONCHIAL ULTRASOUND;  Surgeon: Candee Furbish, MD;  Location: [facility] ENDOSCOPY;  Service: Pulmonary;  Laterality: N/A;     Social History   reports that he has quit smoking. He has a 3.30 pack-year smoking history. He has never used smokeless tobacco. He reports previous alcohol use. He reports that he does not use drugs.   Family History   His family history includes Arthritis in his father and mother; Hyperlipidemia in his father; Hypertension in his father; Lung cancer in his father and mother; Prostate cancer in his brother; Stomach cancer in his maternal grandmother; Stroke in his father. There is no history of Colon cancer, Esophageal cancer, Pancreatic cancer, or  Rectal cancer.   Allergies No Known Allergies   Home Medications  Prior to Admission medications   Medication Sig Start Date End Date Taking? Authorizing Provider  albuterol (VENTOLIN HFA) 108 (90 Base) MCG/ACT inhaler Inhale 2 puffs into the lungs every 4 (four) hours as needed for wheezing or shortness of breath. 08/25/19  Yes Rodriguez-Southworth, Sunday Spillers, PA-C  amitriptyline (ELAVIL) 75 MG tablet Take 1 tablet (75 mg total) by mouth at bedtime. 10/26/18  Yes Dohmeier, Asencion Partridge, MD  amLODipine (NORVASC) 10 MG tablet TAKE 1 TABLET BY MOUTH EVERY DAY Patient taking differently: Take 10 mg by mouth daily.  12/31/19  Yes Pleas Koch, NP  atorvastatin (LIPITOR) 20 MG tablet Take 1 tablet (20 mg total) by mouth daily. For cholesterol. Patient taking differently: Take 20 mg by mouth daily.  11/19/19  Yes Pleas Koch, NP  dexamethasone (DECADRON) 4 MG tablet 2 tablet p.o. twice daily  the day before, day of and day after chemotherapy every 3 weeks. Patient taking differently: Take 8 mg by mouth 2 (two) times daily. 2 tablet by mouth twice daily the day before, day of and day after chemotherapy every 3 weeks. 12/21/19  Yes Curt Bears, MD  folic acid (FOLVITE) 1 MG tablet Take 1 tablet (1 mg total) by mouth daily. 10/04/19  Yes Curt Bears, MD  ibuprofen (ADVIL) 200 MG tablet Take 400 mg by mouth every 6 (six) hours as needed for fever.   Yes [provider]  LYRICA 100 MG capsule Take 100 mg by mouth 3 (three) times daily.  10/10/16  Yes [provider]  mirtazapine (REMERON) 30 MG tablet Take 30 mg by mouth at bedtime. 10/26/19  Yes [provider]  Omega-3 Fatty Acids ([employer] FISH OIL) 1000 MG CAPS TAKE 1 CAPSULE BY MOUTH EVERY DAY WITH A MEAL Patient taking differently: Take 1,000 mg by mouth daily.  03/06/18  Yes Pleas Koch, NP  oxyCODONE-acetaminophen (PERCOCET) 10-325 MG per tablet Take 1 tablet by mouth every 6 (six) hours as needed for pain.  07/29/12  Yes [provider]  prochlorperazine (COMPAZINE) 10 MG tablet Take 1 tablet (10 mg total) by mouth every 6 (six) hours as needed for nausea or vomiting. 10/19/19  Yes Heilingoetter, Cassandra L, PA-C  tiZANidine (ZANAFLEX) 4 MG tablet TAKE 1 TABLET BY MOUTH TWICE A DAY Patient taking differently: Take 4 mg by mouth 2 (two) times daily.  10/26/18  Yes Dohmeier, Asencion Partridge, MD  zolpidem (AMBIEN) 10 MG tablet Take 10 mg by mouth at bedtime as needed for sleep.  11/10/19  Yes [provider]  methylPREDNISolone (MEDROL DOSEPAK) 4 MG TBPK tablet Use as instructed. Patient not taking: Reported on 01/18/2020 12/21/19   Curt Bears, MD    Sherrilyn Rist, MD [place] [facility] Pager: [phone]

## 2020-01-04 NOTE — Progress Notes (Signed)
Pt transported to ICU on BIPAP without complication.  RT to monitor and assess as needed.

## 2020-01-04 NOTE — ED Provider Notes (Signed)
[place] DEPT Provider Note   CSN: [id number] Arrival date & time: 01/17/2020  1238     History Chief Complaint  Patient presents with  . Shortness of Breath    Jerry Wong is a 74 y.o. male.  HPI Level 5 caveat secondary to shortness of breath and altered mental status History obtained from EMS and record review 74 year old male transported via EMS with report of altered mental status.  Patient has known nonsmall cell metastatic cancer under current treatment. Reported increased weakness.  EMS reports sats at 82% on their arrival.  Noncompliance with oxygen which he did have at home.     Past Medical History:  Diagnosis Date  . Arthritis   . Cataract   . Chickenpox   . Chronic headaches   . Chronic kidney disease   . Chronic neck and back pain   . History of kidney stones   . Hyperlipidemia   . Hypertension   . Insomnia   . Lung cancer ([place]) dx'd 08/2019  . Neuromuscular disorder ([place])   . Prostate cancer [facility])     Patient Active Problem List   Diagnosis Date Noted  . Antineoplastic chemotherapy induced anemia 11/30/2019  . Hypokalemia 10/13/2019  . Decreased appetite 10/13/2019  . Chronic respiratory failure with hypoxia ([place]) 09/21/2019  . Non-small cell carcinoma of right lung, stage 4 ([place]) 09/14/2019  . Encounter for antineoplastic chemotherapy 09/14/2019  . Encounter for antineoplastic immunotherapy 09/14/2019  . Goals of care, counseling/discussion 09/14/2019  . Postobstructive pneumonia 09/09/2019  . Sepsis ([place]) 09/03/2019  . Acute kidney injury superimposed on chronic kidney disease ([place]) 09/03/2019  . Acute on chronic respiratory failure with hypoxia ([place]) 09/03/2019  . Cough 09/02/2019  . Decreased renal function 01/15/2019  . Chronic tension-type headache, not intractable 10/26/2018  . Edema of both ankles 10/26/2018  . Insomnia secondary to chronic pain 12/29/2017  . Prostate cancer ([place]) 12/10/2016  .  Prediabetes 12/10/2016  . Preventative health care 12/10/2016  . Essential hypertension 06/03/2016  . Hyperlipidemia 06/03/2016  . Medication monitoring encounter 08/24/2013  . Chronic headaches 08/21/2012  . Chronic neck and back pain 08/21/2012    Past Surgical History:  Procedure Laterality Date  . BACK SURGERY  1974  . BRONCHIAL NEEDLE ASPIRATION BIOPSY  09/06/2019   Procedure: BRONCHIAL NEEDLE ASPIRATION BIOPSIES;  Surgeon: Candee Furbish, MD;  Location: [facility] ENDOSCOPY;  Service: Pulmonary;;  . BRONCHIAL WASHINGS  09/06/2019   Procedure: BRONCHIAL WASHINGS;  Surgeon: Candee Furbish, MD;  Location: [facility] ENDOSCOPY;  Service: Pulmonary;;  . NECK SURGERY  2002  . PROSTATECTOMY    . TONSILLECTOMY  1958  . TRIGGER FINGER RELEASE Left 11/20/2017   left thumb/Dr. Apolonio Schneiders  . VIDEO BRONCHOSCOPY WITH ENDOBRONCHIAL ULTRASOUND N/A 09/06/2019   Procedure: VIDEO BRONCHOSCOPY WITH ENDOBRONCHIAL ULTRASOUND;  Surgeon: Candee Furbish, MD;  Location: [facility] ENDOSCOPY;  Service: Pulmonary;  Laterality: N/A;       Family History  Problem Relation Age of Onset  . Arthritis Mother   . Lung cancer Mother   . Arthritis Father   . Lung cancer Father   . Hyperlipidemia Father   . Hypertension Father   . Stroke Father   . Prostate cancer Brother   . Stomach cancer Maternal Grandmother   . Colon cancer Neg Hx   . Esophageal cancer Neg Hx   . Pancreatic cancer Neg Hx   . Rectal cancer Neg Hx     Social History   Tobacco Use  .  Smoking status: Former Smoker    Packs/day: 0.30    Years: 11.00    Pack years: 3.30  . Smokeless tobacco: Never Used  . Tobacco comment: quit 35-40 years ago per pt  Vaping Use  . Vaping Use: Never used  Substance Use Topics  . Alcohol use: Not Currently  . Drug use: No    Home Medications Prior to Admission medications   Medication Sig Start Date End Date Taking? Authorizing Provider  albuterol (VENTOLIN HFA) 108 (90 Base) MCG/ACT inhaler Inhale 2 puffs into the  lungs every 4 (four) hours as needed for wheezing or shortness of breath. 08/25/19   Rodriguez-Southworth, Sunday Spillers, PA-C  amitriptyline (ELAVIL) 75 MG tablet Take 1 tablet (75 mg total) by mouth at bedtime. 10/26/18   Dohmeier, Asencion Partridge, MD  amLODipine (NORVASC) 10 MG tablet TAKE 1 TABLET BY MOUTH EVERY DAY 12/31/19   Pleas Koch, NP  aspirin EC 81 MG tablet Take 81 mg by mouth daily.     [provider]  atorvastatin (LIPITOR) 20 MG tablet Take 1 tablet (20 mg total) by mouth daily. For cholesterol. 11/19/19   Pleas Koch, NP  dexamethasone (DECADRON) 4 MG tablet 2 tablet p.o. twice daily the day before, day of and day after chemotherapy every 3 weeks. 12/21/19   Curt Bears, MD  folic acid (FOLVITE) 1 MG tablet Take 1 tablet (1 mg total) by mouth daily. 10/04/19   Curt Bears, MD  LYRICA 100 MG capsule Take 100 mg by mouth 2 (two) times daily.  10/10/16   [provider]  methylPREDNISolone (MEDROL DOSEPAK) 4 MG TBPK tablet Use as instructed. 12/21/19   Curt Bears, MD  mirtazapine (REMERON) 30 MG tablet Take 30 mg by mouth at bedtime. 10/26/19   [provider]  Omega-3 Fatty Acids ([employer] FISH OIL) 1000 MG CAPS TAKE 1 CAPSULE BY MOUTH EVERY DAY WITH A MEAL Patient taking differently: Take 1,000 mg by mouth daily. TAKE 1 CAPSULE BY MOUTH EVERY DAY WITH A MEAL 03/06/18   Pleas Koch, NP  oxyCODONE-acetaminophen (PERCOCET) 10-325 MG per tablet Take 1 tablet by mouth as needed for pain (up to five times daily).  07/29/12   [provider]  prochlorperazine (COMPAZINE) 10 MG tablet Take 1 tablet (10 mg total) by mouth every 6 (six) hours as needed for nausea or vomiting. 10/19/19   Heilingoetter, Cassandra L, PA-C  tiZANidine (ZANAFLEX) 4 MG tablet TAKE 1 TABLET BY MOUTH TWICE A DAY Patient taking differently: Take 4 mg by mouth 2 (two) times daily. TAKE 1 TABLET BY MOUTH TWICE A DAY 10/26/18   Dohmeier, Asencion Partridge, MD  zolpidem (AMBIEN) 10 MG tablet  Take 10 mg by mouth at bedtime as needed. 11/10/19   [provider]    Allergies    Patient has no known allergies.  Review of Systems   Review of Systems  All other systems reviewed and are negative.   Physical Exam Updated Vital Signs BP 120/87 (BP Location: Right Arm)   Pulse (!) 120   Temp 98.6 F (37 C) (Oral)   Resp (!) 33   SpO2 100%   Physical Exam Vitals and nursing note reviewed.  Constitutional:      General: He is in acute distress.     Appearance: He is ill-appearing.  HENT:     Head: Normocephalic.     Mouth/Throat:     Pharynx: Oropharynx is clear.     Comments: Mucous membranes are dry Eyes:  Pupils: Pupils are equal, round, and reactive to light.  Cardiovascular:     Rate and Rhythm: Regular rhythm. Tachycardia present.  Pulmonary:     Effort: Tachypnea and respiratory distress present.     Breath sounds: No decreased breath sounds, wheezing or rhonchi.  Abdominal:     General: Bowel sounds are normal.     Palpations: Abdomen is soft.  Musculoskeletal:        General: Normal range of motion.     Cervical back: Normal range of motion.     Right lower leg: No tenderness. No edema.     Left lower leg: No tenderness. No edema.  Skin:    General: Skin is warm and dry.     Capillary Refill: Capillary refill takes less than 2 seconds.  Neurological:     General: No focal deficit present.     Mental Status: He is alert.  Psychiatric:        Mood and Affect: Mood normal.     ED Results / Procedures / Treatments   Labs (all labs ordered are listed, but only abnormal results are displayed) Labs Reviewed  CULTURE, BLOOD (SINGLE)  URINE CULTURE  RESPIRATORY PANEL BY RT PCR (FLU A&B, COVID)  LACTIC ACID, PLASMA  LACTIC ACID, PLASMA  COMPREHENSIVE METABOLIC PANEL  CBC WITH DIFFERENTIAL/PLATELET  PROTIME-INR  APTT  URINALYSIS, ROUTINE W REFLEX MICROSCOPIC  BLOOD GAS, ARTERIAL  RAPID URINE DRUG SCREEN, HOSP PERFORMED    EKG EKG  Interpretation  Date/Time:  Tuesday January 04 2020 12:54:43 EST Ventricular Rate:  117 PR Interval:    QRS Duration: 131 QT Interval:  351 QTC Calculation: 490 R Axis:   -45 Text Interpretation: Sinus tachycardia Multiple premature complexes, vent & supraven Left bundle branch block Confirmed by Pattricia Boss [phone]) on 01/17/2020 3:26:16 PM   Radiology DG Chest Port 1 View  Result Date: 12/27/2019 CLINICAL DATA:  Stage IV lung cancer on active treatment, sleeping more, slow to respond for 3-4 days, question sepsis EXAM: PORTABLE CHEST 1 VIEW COMPARISON:  Portable exam 1336 hours compared 10/15/2019 FINDINGS: Enlargement of cardiac silhouette with slight pulmonary vascular congestion. Mediastinal contours normal. Atherosclerotic calcification aorta. Persistent opacity at RIGHT middle lobe with increased RIGHT basilar infiltrate and small pleural effusion. LEFT lung clear. No pneumothorax or acute osseous findings. IMPRESSION: Chronic RIGHT middle lobe opacity with increased RIGHT pleural effusion and RIGHT lower lobe infiltrate. Electronically Signed   By: Lavonia Dana M.D.   On: 01/03/2020 13:58    Procedures Procedures (including critical care time)  Medications Ordered in ED Medications  sodium chloride 0.9 % bolus 1,000 mL (has no administration in time range)    ED Course  I have reviewed the triage vital signs and the nursing notes.  Pertinent labs & imaging results that were available during my care of the patient were reviewed by me and considered in my medical decision making (see chart for details).    MDM Rules/Calculators/A&P                          74 yo male with metastatic lung cancer presents today with increased dyspnea and declining mental status. Patient with respiratory distress on presentation.  CXR with right lung mass and rll infiltrate.  Broad-spectrum antibiotics and lactated Ringer's at 30 cc/kg ordered Lactic acid pending Lactated ringers  Discussed  patient status with wife.  She states that she does not think that he would want to be  intubated or have CPR or other heroic measures initiated.  However she does continue to want medical management. Ct abdomen and head pending Patient with probable sepsis although prolonged wait for   Final Clinical Impression(s) / ED Diagnoses Final diagnoses:  None    Rx / DC Orders ED Discharge Orders    None       Pattricia Boss, MD 01/05/20 1304

## 2020-01-04 NOTE — Progress Notes (Signed)
A consult was received from an ED physician for Cefepime, Vancomycin per pharmacy dosing.  The patient's profile has been reviewed for ht/wt/allergies/indication/available labs.   A one time order has been placed for Cefepime 2g IV, Vancomycin 1250 mg IV.  Further antibiotics/pharmacy consults should be ordered by admitting physician if indicated.                       Thank you, Gretta Arab PharmD, BCPS Clinical Pharmacist [facility] main pharmacy [phone] 01/13/2020 2:15 PM

## 2020-01-05 ENCOUNTER — Other Ambulatory Visit: Payer: Self-pay

## 2020-01-05 DIAGNOSIS — E440 Moderate protein-calorie malnutrition: Secondary | ICD-10-CM | POA: Insufficient documentation

## 2020-01-05 LAB — CBC WITH DIFFERENTIAL/PLATELET
Abs Immature Granulocytes: 0.48 10*3/uL — ABNORMAL HIGH (ref 0.00–0.07)
Basophils Absolute: 0 10*3/uL (ref 0.0–0.1)
Basophils Relative: 0 %
Eosinophils Absolute: 0 10*3/uL (ref 0.0–0.5)
Eosinophils Relative: 0 %
HCT: 27.7 % — ABNORMAL LOW (ref 39.0–52.0)
Hemoglobin: 8.6 g/dL — ABNORMAL LOW (ref 13.0–17.0)
Immature Granulocytes: 10 %
Lymphocytes Relative: 6 %
Lymphs Abs: 0.3 10*3/uL — ABNORMAL LOW (ref 0.7–4.0)
MCH: 30.2 pg (ref 26.0–34.0)
MCHC: 31 g/dL (ref 30.0–36.0)
MCV: 97.2 fL (ref 80.0–100.0)
Monocytes Absolute: 0.5 10*3/uL (ref 0.1–1.0)
Monocytes Relative: 11 %
Neutro Abs: 3.3 10*3/uL (ref 1.7–7.7)
Neutrophils Relative %: 73 %
Platelets: 82 10*3/uL — ABNORMAL LOW (ref 150–400)
RBC: 2.85 MIL/uL — ABNORMAL LOW (ref 4.22–5.81)
RDW: 22.4 % — ABNORMAL HIGH (ref 11.5–15.5)
WBC: 4.6 10*3/uL (ref 4.0–10.5)
nRBC: 0.9 % — ABNORMAL HIGH (ref 0.0–0.2)

## 2020-01-05 LAB — BASIC METABOLIC PANEL
Anion gap: 15 (ref 5–15)
BUN: 49 mg/dL — ABNORMAL HIGH (ref 8–23)
CO2: 16 mmol/L — ABNORMAL LOW (ref 22–32)
Calcium: 7.8 mg/dL — ABNORMAL LOW (ref 8.9–10.3)
Chloride: 110 mmol/L (ref 98–111)
Creatinine, Ser: 1.03 mg/dL (ref 0.61–1.24)
GFR, Estimated: >60 mL/min (ref >60)
Glucose, Bld: 139 mg/dL — ABNORMAL HIGH (ref 70–99)
Potassium: 4 mmol/L (ref 3.5–5.1)
Sodium: 141 mmol/L (ref 135–145)

## 2020-01-05 LAB — LACTIC ACID, PLASMA: Lactic Acid, Venous: 3 mmol/L (ref 0.5–1.9)

## 2020-01-05 MED ORDER — ENOXAPARIN SODIUM 80 MG/0.8ML ~~LOC~~ SOLN
1.0000 mg/kg | Freq: Two times a day (BID) | SUBCUTANEOUS | Status: DC
  Administered 2020-01-05 – 2020-01-07 (×5): 70 mg via SUBCUTANEOUS
  Filled 2020-01-05 (×7): qty 0.7

## 2020-01-05 MED ORDER — FUROSEMIDE 10 MG/ML IJ SOLN
40.0000 mg | Freq: Once | INTRAMUSCULAR | Status: AC
  Administered 2020-01-05: 40 mg via INTRAVENOUS
  Filled 2020-01-05: qty 4

## 2020-01-05 MED ORDER — SODIUM CHLORIDE 0.9 % IV SOLN
2.0000 g | Freq: Three times a day (TID) | INTRAVENOUS | Status: DC
  Administered 2020-01-05 – 2020-01-07 (×6): 2 g via INTRAVENOUS
  Filled 2020-01-05 (×8): qty 2

## 2020-01-05 NOTE — Progress Notes (Signed)
Initial Nutrition Assessment  DOCUMENTATION CODES:   Non-severe (moderate) malnutrition in context of chronic illness  INTERVENTION:  - diet advancement as medically feasible. - if patient is unsafe for diet advancement, recommend small bore NGT placement and initiation of TF. - patient is a refeeding risk if nutrition support is started.    NUTRITION DIAGNOSIS:   Moderate Malnutrition related to chronic illness, cancer and cancer related treatments as evidenced by mild fat depletion, moderate muscle depletion.  GOAL:   Patient will meet greater than or equal to 90% of their needs  MONITOR:   Diet advancement, Labs, Weight trends, Skin  REASON FOR ASSESSMENT:   Malnutrition Screening Tool  ASSESSMENT:   74 year-old male with medical history of arthritis, cataract, chronic headaches, CKD, chronic neck and back pain, HLD, HTN, insomnia, prostate cancer, stage 4 NSCLC currently on chemo, and neuromuscular disorder. He presented to the ED due to 3-4 days of worsening shortness of breath and AMS.  Patient has been NPO since admission. He is noted to be a/o to self only. No family/visitors present at the time of visit. Patient was unable to provide any relevant information. He was requesting that mittens be removed; explained reason for needing to leave these on.   He underwent thoracentesis 11/9 with 1L fluid removed.   Weight today is 153 lb, weight on 12/13/19 was 158 lb, and weight on 11/30/19 was 161 lb. This indicates 8 lb weight loss (5% body weight) in the past 1 month.  MST report indicates patient or someone with him in the ED reported that he has lost 50 lb since July. Weight on 09/14/19 was 203 lb. This indicates 50 lb weight loss (24.6% body weight), as reported.   Patient was seen by [place] on 12/21/19 due to poor appetite and weight loss. At that time, patient reported no appetite and feeling sick when he tried to eat. He was drinking Boost ~1/day at that time  and that he had an easier time with drinking items than eating solid foods.  Per notes: - acute hypoxic respiratory failure - sepsis - metastatic, stage 4 NSCLC - large pleural effusion s/p thoracentesis - DNR/DNI   Labs reviewed; BUN: 49 mg/dl, Ca: 7.8 mg/dl. Medications reviewed; 25 g albumin x1 dose 11/9, 40 mg IV lasix x1 dose 11/10, PRN miralax and PRN colace.     NUTRITION - FOCUSED PHYSICAL EXAM:    Most Recent Value  Orbital Region Mild depletion  Upper Arm Region Mild depletion  Thoracic and Lumbar Region Mild depletion  Buccal Region Mild depletion  Temple Region Moderate depletion  Clavicle Bone Region Moderate depletion  Clavicle and Acromion Bone Region Moderate depletion  Scapular Bone Region Moderate depletion  Dorsal Hand Unable to assess  [mittens]  Patellar Region No depletion  Anterior Thigh Region No depletion  Posterior Calf Region No depletion  Edema (RD Assessment) Mild  [BLE]  Hair Reviewed  Eyes Reviewed  Mouth Reviewed  [dried blood around and in mouth]  Skin Reviewed  Nails Unable to assess       Diet Order:   Diet Order            Diet NPO time specified  Diet effective now                 EDUCATION NEEDS:   No education needs have been identified at this time  Skin:  Skin Assessment: Skin Integrity Issues: Skin Integrity Issues:: Stage III Stage III: L hip  Last BM:  PTA/unknown  Height:   Ht Readings from Last 1 Encounters:  01/13/2020 5\' 9"  (1.753 m)    Weight:   Wt Readings from Last 1 Encounters:  01/05/20 69.5 kg    Estimated Nutritional Needs:  Kcal:  2450-2650 kcal Protein:  125-140 grams Fluid:  >/= 2.4 L/day     Jarome Matin, MS, RD, LDN, CNSC Inpatient Clinical Dietitian RD pager # available in [facility]  After hours/weekend pager # available in [facility]

## 2020-01-05 NOTE — Progress Notes (Signed)
Pt off BIPAP at this time.

## 2020-01-05 NOTE — Progress Notes (Signed)
Pt. remains off BiPAP V-60, which remains in room, currently on HFNC, [place], sats 96%, RT to monitor.

## 2020-01-05 NOTE — Progress Notes (Signed)
PHARMACY NOTE:  ANTIMICROBIAL RENAL DOSAGE ADJUSTMENT  Current antimicrobial regimen includes a mismatch between antimicrobial dosage and estimated renal function.  As per policy approved by the Pharmacy & Therapeutics and Medical Executive Committees, the antimicrobial dosage will be adjusted accordingly.  Current antimicrobial dosage:  Cefepime 2g IV q12h  Indication: sepsis  Renal Function:Estimated Creatinine Clearance: 63.7 mL/min (by C-G formula based on SCr of 1.03 mg/dL).    Antimicrobial dosage has been changed to:  Cefepime 2g IV q8h  Thank you for allowing pharmacy to be a part of this patient's care.  Gretta Arab PharmD, BCPS Clinical Pharmacist [facility] main pharmacy [phone] 01/05/2020 12:13 PM

## 2020-01-05 NOTE — Progress Notes (Addendum)
NAME:  Jerry Wong, MRN:  [id number], DOB:  [birth year], LOS: 1 ADMISSION DATE:  01/20/2020, CONSULTATION DATE: 01/13/2020 REFERRING MD: Dr. 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 Arthritis   . Cataract   . Chickenpox   . Chronic headaches   . Chronic kidney disease   . Chronic neck and back pain   . History of kidney stones   . Hyperlipidemia   . Hypertension   . Insomnia   . Lung cancer ([place]) dx'd 08/2019  . Neuromuscular disorder ([place])   . Prostate cancer ([place])    Significant Hospital Events   Thoracentesis 01/20/2020 for 1 L of clear yellow fluid  Consults:  Pulmonary critical care  Procedures:  Thoracentesis 01/25/2020  Significant Diagnostic Tests:  Chest x-ray with large right pleural effusion CT scan with large pleural effusion and lung mass  Micro Data:  Blood cultures done No organisms on pleural fluid Antimicrobials:  Cefepime 12/29/2019>> Vancomycin 01/10/2020>>  Interim history/subjective:  Looks better Off BiPAP In atrial fibrillation Awake and interactive  Objective   Blood pressure 122/63, pulse 79, temperature 98.4 F (36.9 C), temperature source Axillary, resp. rate (!) 32, height 5\' 9"  (1.753 m), weight 69.5 kg, SpO2 (!) 88 %.    FiO2 (%):  [35 %] 35 %   Intake/Output Summary (Last 24 hours) at 01/05/2020 1014 Last data filed at 01/05/2020 0700 Gross per 24 hour  Intake 6049.95 ml  Output 500 ml  Net 5549.95 ml   Filed Weights   01/09/2020 1454 01/09/2020 2042 01/05/20 0500  Weight: 68 kg 69.5 kg 69.5 kg     Examination: General: Elderly, emaciated HENT: Dry oral mucosa Lungs: Decreased air entry bilaterally worse on the right Cardiovascular: S1-S2 appreciated, atrial fibrillation Abdomen: Bowel sounds appreciated Extremities: No clubbing, mild edema Neuro: Alert and oriented to person GU:   Resolved Hospital Problem list     Assessment & Plan:  Acute hypoxic respiratory failure -Multifactorial -Large pleural effusion, status post thoracentesis for about a liter -Lung mass -Sepsis  Atrial fibrillation -We will start full dose anticoagulation with Lovenox -Transition to DOAC at some point -On Cardizem IV  Sepsis -Recently on chemotherapy and steroids -Continue antibiotics -Will stop Vanco after dosing today if no gram-positive cocci on cultures, MRSA PCR negative -Likely secondary to pneumonia  Metastatic lung cancer -Known to oncology  Large pleural effusion -Follow-up pleural fluid cytology and cultures   Goals of care discussion with spouse -He is DNR and DNI  IV fluid was discontinued as he was sounding congested this morning Dose of Lasix 40 given x1  He apparently has needed to take Zanaflex, Ambien, Remeron at night -We will initiate oral medications as tolerated -Not able to take oral medications at present  Best practice:  Diet: N.p.o.-we will plan to start diet if able to swallow, bedside swallow eval Pain/Anxiety/Delirium protocol (if indicated): Initiate pain medications when more stable VAP protocol (if indicated): Not needed DVT prophylaxis: Lovenox GI prophylaxis: None Glucose control:  Mobility: Bedrest Code Status: DNI/DNR Family Communication: Spoke to spouse at  bedside Disposition: Transfer to stepdown  Labs   CBC: Recent Labs  Lab 12/29/19 1228 01/22/2020 1330 01/05/20 0025  WBC 23.9* 7.7 4.6  NEUTROABS 18.9* 6.2 3.3  HGB 9.7* 10.0* 8.6*  HCT 29.9* 32.8* 27.7*  MCV 92.9 98.5 97.2  PLT 294 106* 82*    Basic Metabolic  Panel: Recent Labs  Lab 12/29/19 1228 01/24/2020 1330 01/05/20 0025  NA 140 143 141  K 4.0 4.8 4.0  CL 103 109 110  CO2 26 20* 16*  GLUCOSE 143* 131* 139*  BUN 34* 51* 49*  CREATININE 0.96 1.11 1.03  CALCIUM 9.1 8.9 7.8*   GFR: Estimated Creatinine Clearance: 63.7 mL/min (by C-G formula based on SCr of 1.03 mg/dL). Recent Labs  Lab 12/29/19 1228 12/27/2019 1330 01/07/2020 1739 01/02/2020 2244 01/05/20 0025  WBC 23.9* 7.7  --   --  4.6  LATICACIDVEN  --  2.9* 3.4* 3.7* 3.0*    Liver Function Tests: Recent Labs  Lab 12/29/19 1228 01/03/2020 1330  AST 16 62*  ALT 12 62*  ALKPHOS 192* 212*  BILITOT 0.5 1.3*  PROT 6.3* 6.9  ALBUMIN 2.0* 2.3*   No results for input(s): LIPASE, AMYLASE in the last 168 hours. No results for input(s): AMMONIA in the last 168 hours.  ABG    Component Value Date/Time   PHART 7.492 (H) 01/03/2020 1317   PCO2ART 25.7 (L) 01/11/2020 1317   PO2ART 214 (H) 01/16/2020 1317   HCO3 19.5 (L) 01/03/2020 1317   ACIDBASEDEF 2.6 (H) 12/27/2019 1317   O2SAT 99.2 01/06/2020 1317     Coagulation Profile: Recent Labs  Lab 12/27/2019 1330  INR 1.3*    Cardiac Enzymes: No results for input(s): CKTOTAL, CKMB, CKMBINDEX, TROPONINI in the last 168 hours.  HbA1C: Hgb A1c MFr Bld  Date/Time Value Ref Range Status  12/25/2018 12:20 PM 6.2 4.6 - 6.5 % Final    Comment:    Glycemic Control Guidelines for People with Diabetes:Non Diabetic:  <6%Goal of Therapy: <7%Additional Action Suggested:  >8%   12/22/2017 01:03 PM 6.1 4.6 - 6.5 % Final    Comment:    Glycemic Control Guidelines for People with Diabetes:Non Diabetic:  <6%Goal of Therapy: <7%Additional Action Suggested:  >8%     CBG: No results for input(s): GLUCAP in the last 168 hours.  Review of Systems:   Very frail, weakness  Past Medical History  He,  has a past medical history of Arthritis, Cataract, Chickenpox, Chronic headaches, Chronic kidney disease, Chronic neck and back pain, History of  kidney stones, Hyperlipidemia, Hypertension, Insomnia, Lung cancer ([place]) (dx'd 08/2019), Neuromuscular disorder ([place]), and Prostate cancer ([place]).   Surgical History    Past Surgical History:  Procedure Laterality Date  . BACK SURGERY  1974  . BRONCHIAL NEEDLE ASPIRATION BIOPSY  09/06/2019   Procedure: BRONCHIAL NEEDLE ASPIRATION BIOPSIES;  Surgeon: Candee Furbish, MD;  Location: [facility] ENDOSCOPY;  Service: Pulmonary;;  . BRONCHIAL WASHINGS  09/06/2019   Procedure: BRONCHIAL WASHINGS;  Surgeon: Candee Furbish, MD;  Location: [facility] ENDOSCOPY;  Service: Pulmonary;;  . NECK SURGERY  2002  . PROSTATECTOMY    . TONSILLECTOMY  1958  . TRIGGER FINGER RELEASE Left 11/20/2017   left thumb/Dr. Apolonio Schneiders  . VIDEO BRONCHOSCOPY WITH ENDOBRONCHIAL ULTRASOUND N/A 09/06/2019   Procedure: VIDEO BRONCHOSCOPY WITH ENDOBRONCHIAL ULTRASOUND;  Surgeon: Candee Furbish, MD;  Location: [facility] ENDOSCOPY;  Service: Pulmonary;  Laterality: N/A;     Social History   reports that he has quit smoking. He  has a 3.30 pack-year smoking history. He has never used smokeless tobacco. He reports previous alcohol use. He reports that he does not use drugs.   Family History   His family history includes Arthritis in his father and mother; Hyperlipidemia in his father; Hypertension in his father; Lung cancer in his father and mother; Prostate cancer in his brother; Stomach cancer in his maternal grandmother; Stroke in his father. There is no history of Colon cancer, Esophageal cancer, Pancreatic cancer, or Rectal cancer.   Allergies No Known Allergies     Sherrilyn Rist, MD [facility] Pager: [phone]

## 2020-01-06 ENCOUNTER — Inpatient Hospital Stay (HOSPITAL_COMMUNITY): Payer: Medicare Other

## 2020-01-06 DIAGNOSIS — E440 Moderate protein-calorie malnutrition: Secondary | ICD-10-CM

## 2020-01-06 DIAGNOSIS — J90 Pleural effusion, not elsewhere classified: Secondary | ICD-10-CM | POA: Diagnosis not present

## 2020-01-06 LAB — CBC WITH DIFFERENTIAL/PLATELET
Abs Immature Granulocytes: 1.1 10*3/uL — ABNORMAL HIGH (ref 0.00–0.07)
Band Neutrophils: 14 %
Basophils Absolute: 0 10*3/uL (ref 0.0–0.1)
Basophils Relative: 0 %
Eosinophils Absolute: 0 10*3/uL (ref 0.0–0.5)
Eosinophils Relative: 0 %
HCT: 28.1 % — ABNORMAL LOW (ref 39.0–52.0)
Hemoglobin: 8.9 g/dL — ABNORMAL LOW (ref 13.0–17.0)
Lymphocytes Relative: 3 %
Lymphs Abs: 0.3 10*3/uL — ABNORMAL LOW (ref 0.7–4.0)
MCH: 30.6 pg (ref 26.0–34.0)
MCHC: 31.7 g/dL (ref 30.0–36.0)
MCV: 96.6 fL (ref 80.0–100.0)
Metamyelocytes Relative: 9 %
Monocytes Absolute: 0.7 10*3/uL (ref 0.1–1.0)
Monocytes Relative: 7 %
Myelocytes: 2 %
Neutro Abs: 7.8 10*3/uL — ABNORMAL HIGH (ref 1.7–7.7)
Neutrophils Relative %: 65 %
Platelets: 77 10*3/uL — ABNORMAL LOW (ref 150–400)
RBC: 2.91 MIL/uL — ABNORMAL LOW (ref 4.22–5.81)
RDW: 22.9 % — ABNORMAL HIGH (ref 11.5–15.5)
WBC: 9.9 10*3/uL (ref 4.0–10.5)
nRBC: 2.8 % — ABNORMAL HIGH (ref 0.0–0.2)

## 2020-01-06 LAB — URINE CULTURE: Culture: NO GROWTH

## 2020-01-06 LAB — COMPREHENSIVE METABOLIC PANEL
ALT: 45 U/L — ABNORMAL HIGH (ref 0–44)
AST: 38 U/L (ref 15–41)
Albumin: 2.5 g/dL — ABNORMAL LOW (ref 3.5–5.0)
Alkaline Phosphatase: 136 U/L — ABNORMAL HIGH (ref 38–126)
Anion gap: 16 — ABNORMAL HIGH (ref 5–15)
BUN: 46 mg/dL — ABNORMAL HIGH (ref 8–23)
CO2: 18 mmol/L — ABNORMAL LOW (ref 22–32)
Calcium: 8.1 mg/dL — ABNORMAL LOW (ref 8.9–10.3)
Chloride: 114 mmol/L — ABNORMAL HIGH (ref 98–111)
Creatinine, Ser: 1.07 mg/dL (ref 0.61–1.24)
GFR, Estimated: >60 mL/min (ref >60)
Glucose, Bld: 133 mg/dL — ABNORMAL HIGH (ref 70–99)
Potassium: 3 mmol/L — ABNORMAL LOW (ref 3.5–5.1)
Sodium: 148 mmol/L — ABNORMAL HIGH (ref 135–145)
Total Bilirubin: 1.9 mg/dL — ABNORMAL HIGH (ref 0.3–1.2)
Total Protein: 5.9 g/dL — ABNORMAL LOW (ref 6.5–8.1)

## 2020-01-06 LAB — MAGNESIUM: Magnesium: 1.8 mg/dL (ref 1.7–2.4)

## 2020-01-06 LAB — C DIFFICILE QUICK SCREEN W PCR REFLEX
C Diff antigen: NEGATIVE
C Diff interpretation: NOT DETECTED
C Diff toxin: NEGATIVE

## 2020-01-06 LAB — LACTIC ACID, PLASMA: Lactic Acid, Venous: 1.5 mmol/L (ref 0.5–1.9)

## 2020-01-06 MED ORDER — POTASSIUM CHLORIDE 20 MEQ PO PACK
40.0000 meq | PACK | ORAL | Status: DC
  Administered 2020-01-06: 40 meq via ORAL
  Filled 2020-01-06: qty 2

## 2020-01-06 MED ORDER — KCL-LACTATED RINGERS-D5W 20 MEQ/L IV SOLN
INTRAVENOUS | Status: DC
  Filled 2020-01-06 (×3): qty 1000

## 2020-01-06 MED ORDER — LIP MEDEX EX OINT
TOPICAL_OINTMENT | Freq: Two times a day (BID) | CUTANEOUS | Status: DC
  Administered 2020-01-08 – 2020-01-09 (×2): 1 via TOPICAL
  Filled 2020-01-06 (×3): qty 7

## 2020-01-06 MED ORDER — POTASSIUM CHLORIDE 10 MEQ/100ML IV SOLN
10.0000 meq | INTRAVENOUS | Status: AC
  Administered 2020-01-06 (×4): 10 meq via INTRAVENOUS
  Filled 2020-01-06 (×4): qty 100

## 2020-01-06 NOTE — Procedures (Signed)
Thoracentesis  Procedure Note  Jerry Wong  [id number]  10-17-1945  Date:01/06/20  Time:3:25 PM   Provider Performing:Markeise Mathews Alfredo Martinez   Procedure: Thoracentesis with imaging guidance (75300)  Indication(s) Pleural Effusion  Consent Risks of the procedure as well as the alternatives and risks of each were explained to the patient and/or caregiver.  Consent for the procedure was obtained and is signed in the bedside chart  Anesthesia Topical only with 1% lidocaine    Time Out Verified patient identification, verified procedure, site/side was marked, verified correct patient position, special equipment/implants available, medications/allergies/relevant history reviewed, required imaging and test results available.   Sterile Technique Maximal sterile technique including full sterile barrier drape, hand hygiene, sterile gown, sterile gloves, mask, hair covering, sterile ultrasound probe cover (if used).  Procedure Description Ultrasound was used to identify appropriate pleural anatomy for placement and overlying skin marked.  Area of drainage cleaned and draped in sterile fashion. Lidocaine was used to anesthetize the skin and subcutaneous tissue.  1400 cc's of clear yellow fluid was drained from the right pleural space. Catheter then removed and bandaid applied to site.   Complications/Tolerance None; patient tolerated the procedure well. Chest X-ray is ordered to confirm no post-procedural complication.   EBL Minimal   Specimen(s) Pleural fluid  Proctored Mikki Harbor, PA-S for procedure.   [name], MSN, NP-C, AGACNP-BC [facility] 01/06/2020, 3:26 PM   Please see [email or web address] for pager details.

## 2020-01-06 NOTE — Evaluation (Signed)
Clinical/Bedside Swallow Evaluation Patient Details  Name: Jerry Wong MRN: [id number] Date of Birth: 10-16-1945  Today's Date: 01/06/2020 Time: SLP Start Time (ACUTE ONLY): 1316 SLP Stop Time (ACUTE ONLY): 1355 SLP Time Calculation (min) (ACUTE ONLY): 39 min  Past Medical History:  Past Medical History:  Diagnosis Date  . Arthritis   . Cataract   . Chickenpox   . Chronic headaches   . Chronic kidney disease   . Chronic neck and back pain   . History of kidney stones   . Hyperlipidemia   . Hypertension   . Insomnia   . Lung cancer ([place]) dx'd 08/2019  . Neuromuscular disorder ([place])   . Prostate cancer [facility])    Past Surgical History:  Past Surgical History:  Procedure Laterality Date  . BACK SURGERY  1974  . BRONCHIAL NEEDLE ASPIRATION BIOPSY  09/06/2019   Procedure: BRONCHIAL NEEDLE ASPIRATION BIOPSIES;  Surgeon: Candee Furbish, MD;  Location: [facility] ENDOSCOPY;  Service: Pulmonary;;  . BRONCHIAL WASHINGS  09/06/2019   Procedure: BRONCHIAL WASHINGS;  Surgeon: Candee Furbish, MD;  Location: [facility] ENDOSCOPY;  Service: Pulmonary;;  . NECK SURGERY  2002  . PROSTATECTOMY    . TONSILLECTOMY  1958  . TRIGGER FINGER RELEASE Left 11/20/2017   left thumb/Dr. Apolonio Schneiders  . 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 And had a few courses that was not working and the last chemotherapy was changed. Admitted with worsening shortness of breath of about 3 to 4 days duration altered mentation   Assessment / Plan / Recommendation Clinical Impression  Pt presents with concern for pharyngeal dysphagia likely in setting of deconditioning. Oral secretions noted, suspect impacted from  recent chemotherapy, improving post oral care by SLP. Pt alert, cooperative however altered mentation persists. Pt with reduced laryngeal elevation per palpation, immediate and delayed coughing, and intermittent wet vocal quality following isolated trials of thin liquids, concerning for reduced airway protection. Nectar thick liquids, puree textures, and solid POs were without overt s/sx of aspiration however cannot exclude silent aspiration. Consulted with MD regarding goals of care: instrumental assessment vs palliative comfort approach.  Recommend nectar thick liquids and dysphagia 3 (mechanical soft consistencies) until goals of care established. SLP to follow up.  SLP Visit Diagnosis: Dysphagia, unspecified (R13.10)    Aspiration Risk  Moderate aspiration risk;Risk for inadequate nutrition/hydration    Diet Recommendation   Nectar thick, dysphagia 3 (mechanical soft)  Medication Administration: Crushed with puree    Other  Recommendations Oral Care Recommendations: Oral care BID Other Recommendations: Order thickener from pharmacy   Follow up Recommendations 24 hour supervision/assistance      Frequency and Duration min 2x/week  1 week       Prognosis Prognosis for Safe Diet Advancement: Fair Barriers to Reach Goals: Severity of deficits      Swallow Study   General Date of Onset: 01/03/2020 HPI: 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 And had a few courses that was not working and the last chemotherapy was changed. Admitted with worsening shortness of breath of about 3 to 4 days duration altered mentation Type of Study: Bedside Swallow  Evaluation Previous Swallow Assessment: none on file Diet Prior to this Study: NPO Temperature Spikes Noted: No Respiratory Status: Nasal cannula History of Recent Intubation: No Behavior/Cognition: Alert;Pleasant mood;Confused;Requires  cueing Oral Cavity Assessment: Other (comment) (secretions likely mucositis with recent chemotherapy) Oral Care Completed by SLP: Yes Oral Cavity - Dentition: Missing dentition Vision: Functional for self-feeding Self-Feeding Abilities: Needs assist Patient Positioning: Upright in bed Baseline Vocal Quality: Low vocal intensity Volitional Cough: Congested;Wet;Weak Volitional Swallow: Able to elicit    Oral/Motor/Sensory Function Overall Oral Motor/Sensory Function: Generalized oral weakness   Ice Chips Ice chips: Impaired Presentation: Spoon Oral Phase Functional Implications: Prolonged oral transit Pharyngeal Phase Impairments: Suspected delayed Swallow;Multiple swallows;Decreased hyoid-laryngeal movement;Throat Clearing - Delayed   Thin Liquid Thin Liquid: Impaired Presentation: Cup;Straw Oral Phase Functional Implications: Prolonged oral transit Pharyngeal  Phase Impairments: Suspected delayed Swallow;Decreased hyoid-laryngeal movement;Multiple swallows;Wet Vocal Quality;Throat Clearing - Delayed;Throat Clearing - Immediate;Cough - Immediate;Cough - Delayed    Nectar Thick Nectar Thick Liquid: Impaired Presentation: Cup Pharyngeal Phase Impairments: Suspected delayed Swallow;Decreased hyoid-laryngeal movement;Multiple swallows;Throat Clearing - Delayed   Honey Thick Honey Thick Liquid: Not tested   Puree Puree: Impaired Pharyngeal Phase Impairments: Suspected delayed Swallow;Decreased hyoid-laryngeal movement;Multiple swallows   Solid     Solid: Impaired Presentation: Self Fed Oral Phase Impairments: Impaired mastication Oral Phase Functional Implications: Prolonged oral transit;Oral residue Pharyngeal Phase Impairments: Suspected delayed Swallow;Multiple swallows;Decreased hyoid-laryngeal movement      Bridie Colquhoun E Shenise Wolgamott MA, CCC-SLP Acute Rehabilitation Services  01/06/2020,2:09 PM

## 2020-01-06 NOTE — TOC Initial Note (Signed)
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  Main historian is the wife. o2 at 4l/min, Plan home with home care services Following for progression.   Expected Discharge Plan: [place] Barriers to Discharge: No Barriers Identified   Patient Goals and CMS Choice Patient states their goals for this hospitalization and ongoing recovery are:: to go home CMS Medicare.gov Compare Post Acute Care list provided to:: Patient Represenative (must comment)    Expected Discharge Plan and Services Expected Discharge Plan: [place]   Discharge Planning Services: CM Consult   Living arrangements for the past 2 months: Single Family Home                                      Prior Living Arrangements/Services Living arrangements for the past 2 months: Single Family Home Lives with:: Spouse Patient language and need for interpreter reviewed:: Yes Do you feel safe going back to the place where you live?: Yes      Need for Family Participation in Patient Care: Yes (Comment) Care giver support system in place?: Yes (comment)   Criminal Activity/Legal Involvement Pertinent to Current Situation/Hospitalization: No - Comment as needed  Activities of Daily Living Home Assistive Devices/Equipment: Gilford Rile (specify type) (hover round) ADL  Screening (condition at time of admission) Patient's cognitive ability adequate to safely complete daily activities?: No Is the patient deaf or have difficulty hearing?: No Does the patient have difficulty seeing, even when wearing glasses/contacts?: No Does the patient have difficulty concentrating, remembering, or making decisions?: Yes Patient able to express need for assistance with ADLs?: No Does the patient have difficulty dressing or bathing?: Yes Independently performs ADLs?: No Communication: Needs assistance Is this a change from baseline?: Pre-admission baseline Dressing (OT): Needs assistance Is this a change from baseline?: Pre-admission baseline Grooming: Needs assistance Is this a change from baseline?: Pre-admission baseline Feeding: Needs assistance Is this a change from baseline?: Pre-admission baseline Bathing: Needs assistance Is this a change from baseline?: Pre-admission baseline Toileting: Needs assistance Is this a change from baseline?: Pre-admission baseline In/Out Bed: Needs assistance Is this a change from baseline?: Pre-admission baseline Walks in Home: Needs assistance Is this a change from baseline?: Pre-admission baseline Does the patient have difficulty walking or climbing stairs?: Yes Weakness of Legs: Both Weakness of Arms/Hands: Both  Permission Sought/Granted                  Emotional Assessment Appearance:: Appears stated age Attitude/Demeanor/Rapport: Other (comment) (confused)   Orientation: : Oriented to Self, Oriented to Place Alcohol / Substance Use: Not Applicable Psych Involvement: No (comment)  Admission diagnosis:  Shortness of breath [R06.02] S/P thoracentesis [Z98.890] Sepsis ([place]) [A41.9]  Patient Active Problem List   Diagnosis Date Noted  . Malnutrition of moderate degree 01/05/2020  . Pressure injury of skin 01/07/2020  . Antineoplastic chemotherapy induced anemia 11/30/2019  . Hypokalemia 10/13/2019  .  Decreased appetite 10/13/2019  . Chronic respiratory failure with hypoxia ([place]) 09/21/2019  . Non-small cell carcinoma of right lung, stage 4 ([place]) 09/14/2019  . Encounter for antineoplastic chemotherapy 09/14/2019  . Encounter for antineoplastic immunotherapy 09/14/2019  . Goals of care, counseling/discussion 09/14/2019  . Postobstructive pneumonia 09/09/2019  . Sepsis ([place]) 09/03/2019  . Acute kidney injury superimposed on chronic kidney disease ([place]) 09/03/2019  . Acute on chronic respiratory failure with hypoxia ([place]) 09/03/2019  . Cough 09/02/2019  . Decreased renal function 01/15/2019  . Chronic tension-type headache, not intractable 10/26/2018  . Edema of both ankles 10/26/2018  . Insomnia secondary to chronic pain 12/29/2017  . Prostate cancer ([place]) 12/10/2016  . Prediabetes 12/10/2016  . Preventative health care 12/10/2016  . Essential hypertension 06/03/2016  . Hyperlipidemia 06/03/2016  . Medication monitoring encounter 08/24/2013  . Chronic headaches 08/21/2012  . Chronic neck and back pain 08/21/2012   PCP:  Pleas Koch, NP Pharmacy:   [id number] - [place], [place] [address] [address] Phone: [phone] Fax: [phone]     Social Determinants of Health (SDOH) Interventions    Readmission Risk Interventions No flowsheet data found.

## 2020-01-06 NOTE — Progress Notes (Signed)
HEMATOLOGY-ONCOLOGY PROGRESS NOTE  SUBJECTIVE: The patient is being seen by speech therapy at the time of visit.  There is concern for aspiration.  He reports that his breathing is better.  He offers no specific complaints.  No family at the bedside.  Nursing reports that he has had intermittent confusion throughout the day.  Oncology History  Non-small cell carcinoma of right lung, stage 4 ([place])  09/14/2019 Initial Diagnosis   Non-small cell carcinoma of right lung, stage 3 ([place])   09/27/2019 - 09/27/2019 Chemotherapy   The patient had palonosetron (ALOXI) injection 0.25 mg, 0.25 mg, Intravenous,  Once, 0 of 7 cycles CARBOplatin (PARAPLATIN) 200 mg in sodium chloride 0.9 % 100 mL chemo infusion, 200 mg (100 % of original dose 198.8 mg), Intravenous,  Once, 0 of 7 cycles Dose modification: 198.8 mg (original dose 198.8 mg, Cycle 1) PACLitaxel (TAXOL) 96 mg in sodium chloride 0.9 % 250 mL chemo infusion (</= 80mg /m2), 45 mg/m2 = 96 mg, Intravenous,  Once, 0 of 7 cycles  for chemotherapy treatment.    10/19/2019 - 11/30/2019 Chemotherapy   The patient had palonosetron (ALOXI) injection 0.25 mg, 0.25 mg, Intravenous,  Once, 3 of 4 cycles Administration: 0.25 mg (10/19/2019), 0.25 mg (11/30/2019), 0.25 mg (11/09/2019) PEMEtrexed (ALIMTA) 1,000 mg in sodium chloride 0.9 % 100 mL chemo infusion, 500 mg/m2 = 1,000 mg, Intravenous,  Once, 3 of 6 cycles Dose modification: 400 mg/m2 (original dose 500 mg/m2, Cycle 3, Reason: Dose not tolerated) Administration: 1,000 mg (10/19/2019), 800 mg (11/30/2019), 1,000 mg (11/09/2019) CARBOplatin (PARAPLATIN) 530 mg in sodium chloride 0.9 % 250 mL chemo infusion, 460 mg (100 % of original dose 461.5 mg), Intravenous,  Once, 3 of 4 cycles Dose modification: 461.5 mg (original dose 461.5 mg, Cycle 1), 525.5 mg (original dose 525.5 mg, Cycle 3), 420.4 mg (original dose 525.5 mg, Cycle 3, Reason: Dose not tolerated), 514 mg (original dose 514 mg, Cycle 2) Administration: 530 mg  (10/19/2019), 420 mg (11/30/2019), 510 mg (11/09/2019) fosaprepitant (EMEND) 150 mg in sodium chloride 0.9 % 145 mL IVPB, 150 mg, Intravenous,  Once, 3 of 4 cycles Administration: 150 mg (10/19/2019), 150 mg (11/30/2019), 150 mg (11/09/2019) pembrolizumab (KEYTRUDA) 200 mg in sodium chloride 0.9 % 50 mL chemo infusion, 200 mg, Intravenous, Once, 3 of 6 cycles Administration: 200 mg (10/19/2019), 200 mg (11/30/2019), 200 mg (11/09/2019)  for chemotherapy treatment.    12/29/2019 -  Chemotherapy   The patient had pegfilgrastim-cbqv (UDENYCA) injection 6 mg, 6 mg, Subcutaneous, Once, 1 of 6 cycles Administration: 6 mg (12/31/2019) DOCEtaxel (TAXOTERE) 110 mg in sodium chloride 0.9 % 250 mL chemo infusion, 60 mg/m2 = 110 mg (100 % of original dose 60 mg/m2), Intravenous,  Once, 1 of 6 cycles Dose modification: 60 mg/m2 (original dose 60 mg/m2, Cycle 1, Reason: Provider Judgment) Administration: 110 mg (12/29/2019) ramucirumab (CYRAMZA) 700 mg in sodium chloride 0.9 % 180 mL chemo infusion, 10 mg/kg = 700 mg, Intravenous, Once, 1 of 6 cycles Administration: 700 mg (12/29/2019)  for chemotherapy treatment.       REVIEW OF SYSTEMS:   Constitutional: Denies fevers, chills  Respiratory: Shortness of breath improved today Cardiovascular: Denies palpitation, chest discomfort Gastrointestinal:  Denies nausea, heartburn or change in bowel habits Skin: Denies abnormal skin rashes Lymphatics: Denies new lymphadenopathy or easy bruising Neurological:Denies numbness, tingling or new weaknesses Behavioral/Psych: Mood is stable, no new changes  Extremities: No lower extremity edema All other systems were reviewed with the patient and are negative.  I have  reviewed the past medical history, past surgical history, social history and family history with the patient and they are unchanged from previous note.   PHYSICAL EXAMINATION: ECOG PERFORMANCE STATUS: 2 - Symptomatic, <50% confined to bed  Vitals:   01/06/20  1300 01/06/20 1400  BP: 133/63 130/79  Pulse: 100 (!) 25  Resp: (!) 21 20  Temp:    SpO2: 95% 100%   Filed Weights   01/21/2020 2042 01/05/20 0500 01/06/20 0500  Weight: 69.5 kg 69.5 kg 68.9 kg    Intake/Output from previous day: 11/10 0701 - 11/11 0700 In: 641.9 [I.V.:342.4; IV Piggyback:299.5] Out: 2501 [Urine:2500; Stool:1]  GENERAL: Alert, cachectic, no distress SKIN: skin color, texture, turgor are normal, no rashes or significant lesions EYES: normal, Conjunctiva are pink and non-injected, sclera clear OROPHARYNX: No thrush, thick brown secretions in his mouth LUNGS: Anterior lungs with coarse breath sounds HEART: regular rate & rhythm and no murmurs and 1+ bilateral lower extremity edema ABDOMEN:abdomen soft, non-tender and normal bowel sounds NEURO: alert & oriented x 3 with fluent speech, no focal motor/sensory deficits  LABORATORY DATA:  I have reviewed the data as listed CMP Latest Ref Rng & Units 01/06/2020 01/05/2020 01/23/2020  Glucose 70 - 99 mg/dL 133(H) 139(H) 131(H)  BUN 8 - 23 mg/dL 46(H) 49(H) 51(H)  Creatinine 0.61 - 1.24 mg/dL 1.07 1.03 1.11  Sodium 135 - 145 mmol/L 148(H) 141 143  Potassium 3.5 - 5.1 mmol/L 3.0(L) 4.0 4.8  Chloride 98 - 111 mmol/L 114(H) 110 109  CO2 22 - 32 mmol/L 18(L) 16(L) 20(L)  Calcium 8.9 - 10.3 mg/dL 8.1(L) 7.8(L) 8.9  Total Protein 6.5 - 8.1 g/dL 5.9(L) - 6.9  Total Bilirubin 0.3 - 1.2 mg/dL 1.9(H) - 1.3(H)  Alkaline Phos 38 - 126 U/L 136(H) - 212(H)  AST 15 - 41 U/L 38 - 62(H)  ALT 0 - 44 U/L 45(H) - 62(H)    Lab Results  Component Value Date   WBC 9.9 01/06/2020   HGB 8.9 (L) 01/06/2020   HCT 28.1 (L) 01/06/2020   MCV 96.6 01/06/2020   PLT 77 (L) 01/06/2020   NEUTROABS 7.8 (H) 01/06/2020    CT Head Wo Contrast  Result Date: 01/05/2020 CLINICAL DATA:  Altered mental status.  Lung carcinoma EXAM: CT HEAD WITHOUT CONTRAST TECHNIQUE: Contiguous axial images were obtained from the base of the skull through the vertex  without intravenous contrast. COMPARISON:  None. FINDINGS: Brain: There is mild age related volume loss. There is no intracranial mass, hemorrhage, extra-axial fluid collection, or midline shift. There is mild small vessel disease in the centra semiovale bilaterally. Elsewhere brain parenchyma appears unremarkable. No evident acute infarct. Vascular: No hyperdense vessel. There is calcification in each carotid siphon region. Skull: Bony calvarium appears intact. Sinuses/Orbits: Paranasal sinuses are clear. Orbits appear symmetric bilaterally. Other: Mastoid air cells are clear. IMPRESSION: Age related volume loss with slight periventricular small vessel disease. No mass or hemorrhage. No acute infarct. Foci of arterial vascular calcification noted. Electronically Signed   By: Lowella Grip III M.D.   On: 12/28/2019 17:01   CT Chest W Contrast  Result Date: 01/25/2020 CLINICAL DATA:  74 year old male with abdominal pain. Stage IV lung cancer. EXAM: CT CHEST, ABDOMEN, AND PELVIS WITH CONTRAST TECHNIQUE: Multidetector CT imaging of the chest, abdomen and pelvis was performed following the standard protocol during bolus administration of intravenous contrast. CONTRAST:  158mL OMNIPAQUE IOHEXOL 300 MG/ML  SOLN COMPARISON:  CT of the chest abdomen pelvis dated 12/17/2019. FINDINGS:  Evaluation of this exam is limited due to respiratory motion artifact. CT CHEST FINDINGS Cardiovascular: There is mild cardiomegaly. No pericardial effusion. There is retrograde flow of contrast from the right atrium into the IVC suggestive of right heart dysfunction. There is mild atherosclerotic calcification of thoracic aorta. No aneurysmal dilatation or dissection. The origins of the great vessels of the aortic arch appear patent as visualized. The central pulmonary arteries appear patent. Mediastinum/Nodes: Interval decrease in the size of the lymph nodes in the prevascular space compared to prior CT. Slightly decreased in the size  of the lymph node anterior to the carina measuring approximately 8 mm in short axis (previously 11 mm). Decrease in the size of level IV lymph nodes compared to the prior CT measuring up to 11 mm in short axis on the right (8/3) previously measuring 14 mm. The esophagus and the thyroid gland are grossly unremarkable. No mediastinal fluid collection. Lungs/Pleura: There is a moderate size right pleural effusion, increased in size since the prior CT. There is associated compressive atelectasis of the majority of the right lower lobe, although pneumonia is not excluded. There is a 7.6 x 5.7 cm (previously 4.8 x 3.3) mass in the right perihilar region along the major fissure. Patchy ground-glass density and diffuse interstitial thickening with associated air bronchogram in the right upper and right middle lobes is new since the prior CT and may represent pneumonia or further extension of tumor. Clinical correlation is recommended. No pneumothorax. There is mass effect and compression of the bronchus intermedius and right middle and right lower lobe bronchi. There is complete occlusion of the right lower lobe bronchus just after bifurcation of the bronchus intermedius. Musculoskeletal: There is a mildly enlarged lymph node in the left axilla measuring 11 mm in short axis. There is a lytic lesion involving the left second rib with associated pathologic fracture also seen on the prior CT. There is a lytic lesion involving the right humeral head, present on the prior CT. Multiple additional lytic lesions involving the upper sternum, C7, T1, and T2 relatively similar to prior CT. CT ABDOMEN PELVIS FINDINGS No intra-abdominal free air or free fluid. Hepatobiliary: The liver and gallbladder appear unremarkable. Pancreas: Unremarkable. No pancreatic ductal dilatation or surrounding inflammatory changes. Spleen: Normal in size without focal abnormality. Adrenals/Urinary Tract: Bilateral adrenal masses measuring approximately 4.6  x 4.1 cm on the left (previously 4.9 x 3.9 cm) and 2.4 x 1.9 cm on the right (previously 2.9 x 2.0 cm). There is mild bilateral renal parenchyma atrophy. Multiple bilateral renal cysts and smaller hypodense lesions which are too small to characterize. There is no hydronephrosis on either side. There is symmetric enhancement of the renal parenchyma. The visualized ureters and urinary bladder appear unremarkable. Stomach/Bowel: There are scattered sigmoid diverticula without active inflammatory changes. There is no bowel obstruction or active inflammation. The appendix is normal. Vascular/Lymphatic: Moderate aortoiliac atherosclerotic disease. The IVC is unremarkable. No portal venous gas. Left para-aortic/retroperitoneal adenopathy measuring 1.6 cm in short axis (previously 1.9 cm). Reproductive: The prostate is not visualized. Other: Midline vertical anterior pelvic wall incisional scar. Musculoskeletal: Multiple osseous lytic lesions relatively similar to prior CT. No acute osseous pathology. IMPRESSION: 1. Interval increase in the size of the right perihilar mass compared to the prior CT. There is associated mass effect and compression of the bronchus intermedius and right middle and right lower lobe bronchi. There is complete occlusion of the right lower lobe bronchus just after bifurcation of the bronchus intermedius. 2. Patchy  area of ground-glass and interstitial density in the right upper and right middle lobe, new since the prior CT. This may represent pneumonia or tumoral extension. 3. Overall interval decrease in the cervical, mediastinal, and retroperitoneal nodal disease as well as decrease in the size of the adrenal metastasis. 4. Extensive osseous metastases relatively similar to prior CT. 5. Interval increase in the size of the right pleural effusion with associated compressive atelectasis of the majority of the right lower lobe. 6. No bowel obstruction. Normal appendix. 7. Aortic Atherosclerosis  (ICD10-I70.0). Electronically Signed   By: Anner Crete M.D.   On: 01/05/2020 17:22   CT Chest W Contrast  Result Date: 12/18/2019 CLINICAL DATA:  Restaging non-small cell lung cancer. Status post chemotherapy and XRT. EXAM: CT CHEST, ABDOMEN, AND PELVIS WITH CONTRAST TECHNIQUE: Multidetector CT imaging of the chest, abdomen and pelvis was performed following the standard protocol during bolus administration of intravenous contrast. CONTRAST:  117mL OMNIPAQUE IOHEXOL 300 MG/ML  SOLN COMPARISON:  PET-CT 09/17/2019 FINDINGS: CT CHEST FINDINGS Cardiovascular: The heart is upper limits of normal in size. No significant pericardial effusion. Aortic atherosclerosis. Coronary artery calcification identified. Mediastinum/Nodes: Normal appearance of the thyroid gland. The trachea appears patent and is midline. Normal appearance of the esophagus. Progression of thoracic adenopathy: Left supraclavicular lymph node measures 1.4 cm, image 11/2. New from previous exam. Right supraclavicular lymph node measures 1.3 cm, image 12/2. Also new from previous exam. Multiple enlarged superior mediastinal and prevascular lymph nodes: Index left pre-vascular lymph node measures 1.1 cm, image 26/2. New from previous exam. Right paratracheal lymph node measures 1 cm, image 19/2. Also new from previous exam. Previously referenced enlarged subcarinal lymph node measures 0.8 cm, image 39/2. On the prior exam this measured 2.3 cm. Conglomeration of enlarged right hilar lymph nodes measured 2.2 x 1.5 cm on today's exam, image 37/2. Previously 3.8 x 3.1 cm. Lungs/Pleura: New right pleural effusion. The right lower lobe lung mass has decreased in size from previous exam. On today's study this measures 3.2 by 3.7 by 4.1 cm, image 48/2 and image 86/4. On the previous exam using the same measurements scheme this measured 5.5 x 7.7 x 6.5 cm Signs of lymphangitic spread of tumor is again noted within the right lower lobe with interlobular  septal thickening and nodularity. Musculoskeletal: Lytic lesion involving the anterolateral aspect of the left second rib measures 1.6 cm. On the previous PET-CT there is a focal area of hypermetabolism corresponding to this lesion without changes on the corresponding CT images. Similarly, there are multiple lytic bone lesions within the thoracic spine which roughly correlate 2 areas of hypermetabolism on previous PET-CT as seen on image 98/5 CT ABDOMEN PELVIS FINDINGS Hepatobiliary: No suspicious subtle, nonspecific area of low attenuation within inferior right hepatic lobe measures 8 mm. This was not confidently noted on previous imaging. Gallbladder unremarkable. Pancreas: Unremarkable. No pancreatic ductal dilatation or surrounding inflammatory changes. Spleen: Normal in size without focal abnormality. 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 Previously 3.3 x 2.1 cm. New right adrenal gland metastasis measures 2.9 x 2.0 cm, image 65/2.Bilateral kidney cysts. No mass or hydronephrosis. Urinary bladder is unremarkable Stomach/Bowel: Stomach is within normal limits. Appendix appears normal. No evidence of bowel wall thickening, distention, or inflammatory changes. Vascular/Lymphatic: Aortic atherosclerosis. No aneurysm. New left retroperitoneal lymph node measures 3.2 x 1.9 cm, image 77/2. No pelvic or inguinal adenopathy. Reproductive: Signs of previous prostatectomy. Other: No free fluid or fluid collections. 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 These are new when compared with the previous imaging including 1 cm lesion within the L2 vertebra as well as a 1.4 cm lesion within L3. Multiple bilateral lytic lesions are noted involving the iliac bone which by CT are new. New lucent lesions are identified within bilateral proximal femurs. IMPRESSION: 1. Mixed response to therapy. Interval improvement in right lower lobe lung mass as well as  right hilar and subcarinal adenopathy. Interval development of bilateral mediastinal and supraclavicular adenopathy. There is also new left retroperitoneal adenopathy. 2. 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 New right adrenal gland metastasis. 3. 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 By CT these are new when compared with previous PET-CT from 09/17/2019. 4. New right pleural effusion. 5. Coronary artery calcification. 6. Aortic atherosclerosis. Aortic Atherosclerosis (ICD10-I70.0). Electronically Signed   By: Kerby Moors M.D.   On: 12/18/2019 12:36   CT ABDOMEN PELVIS W CONTRAST  Result Date: 01/08/2020 CLINICAL DATA:  74 year old male with abdominal pain. Stage IV lung cancer. EXAM: CT CHEST, ABDOMEN, AND PELVIS WITH CONTRAST TECHNIQUE: Multidetector CT imaging of the chest, abdomen and pelvis was performed following the standard protocol during bolus administration of intravenous contrast. CONTRAST:  127mL OMNIPAQUE IOHEXOL 300 MG/ML  SOLN COMPARISON:  CT of the chest abdomen pelvis dated 12/17/2019. FINDINGS: Evaluation of this exam is limited due to respiratory motion artifact. CT CHEST FINDINGS Cardiovascular: There is mild cardiomegaly. No pericardial effusion. There is retrograde flow of contrast from the right atrium into the IVC suggestive of right heart dysfunction. There is mild atherosclerotic calcification of thoracic aorta. No aneurysmal dilatation or dissection. The origins of the great vessels of the aortic arch appear patent as visualized. The central pulmonary arteries appear patent. Mediastinum/Nodes: Interval decrease in the size of the lymph nodes in the prevascular space compared to prior CT. Slightly decreased in the size of the lymph node anterior to the carina measuring approximately 8 mm in short axis (previously 11 mm). Decrease in the size of level IV lymph nodes compared to the prior CT measuring up to 11 mm in short axis on the right (8/3)  previously measuring 14 mm. The esophagus and the thyroid gland are grossly unremarkable. No mediastinal fluid collection. Lungs/Pleura: There is a moderate size right pleural effusion, increased in size since the prior CT. There is associated compressive atelectasis of the majority of the right lower lobe, although pneumonia is not excluded. There is a 7.6 x 5.7 cm (previously 4.8 x 3.3) mass in the right perihilar region along the major fissure. Patchy ground-glass density and diffuse interstitial thickening with associated air bronchogram in the right upper and right middle lobes is new since the prior CT and may represent pneumonia or further extension of tumor. Clinical correlation is recommended. No pneumothorax. There is mass effect and compression of the bronchus intermedius and right middle and right lower lobe bronchi. There is complete occlusion of the right lower lobe bronchus just after bifurcation of the bronchus intermedius. Musculoskeletal: There is a mildly enlarged lymph node in the left axilla measuring 11 mm in short axis. There is a lytic lesion involving the left second rib with associated pathologic fracture also seen on the prior CT. There is a lytic lesion involving the right humeral head, present on the prior CT. Multiple additional lytic lesions involving the upper sternum, C7, T1, and T2 relatively similar to prior CT. CT ABDOMEN PELVIS FINDINGS No intra-abdominal free air or free fluid. Hepatobiliary:  The liver and gallbladder appear unremarkable. Pancreas: Unremarkable. No pancreatic ductal dilatation or surrounding inflammatory changes. Spleen: Normal in size without focal abnormality. Adrenals/Urinary Tract: Bilateral adrenal masses measuring approximately 4.6 x 4.1 cm on the left (previously 4.9 x 3.9 cm) and 2.4 x 1.9 cm on the right (previously 2.9 x 2.0 cm). There is mild bilateral renal parenchyma atrophy. Multiple bilateral renal cysts and smaller hypodense lesions which are too  small to characterize. There is no hydronephrosis on either side. There is symmetric enhancement of the renal parenchyma. The visualized ureters and urinary bladder appear unremarkable. Stomach/Bowel: There are scattered sigmoid diverticula without active inflammatory changes. There is no bowel obstruction or active inflammation. The appendix is normal. Vascular/Lymphatic: Moderate aortoiliac atherosclerotic disease. The IVC is unremarkable. No portal venous gas. Left para-aortic/retroperitoneal adenopathy measuring 1.6 cm in short axis (previously 1.9 cm). Reproductive: The prostate is not visualized. Other: Midline vertical anterior pelvic wall incisional scar. Musculoskeletal: Multiple osseous lytic lesions relatively similar to prior CT. No acute osseous pathology. IMPRESSION: 1. Interval increase in the size of the right perihilar mass compared to the prior CT. There is associated mass effect and compression of the bronchus intermedius and right middle and right lower lobe bronchi. There is complete occlusion of the right lower lobe bronchus just after bifurcation of the bronchus intermedius. 2. Patchy area of ground-glass and interstitial density in the right upper and right middle lobe, new since the prior CT. This may represent pneumonia or tumoral extension. 3. Overall interval decrease in the cervical, mediastinal, and retroperitoneal nodal disease as well as decrease in the size of the adrenal metastasis. 4. Extensive osseous metastases relatively similar to prior CT. 5. Interval increase in the size of the right pleural effusion with associated compressive atelectasis of the majority of the right lower lobe. 6. No bowel obstruction. Normal appendix. 7. Aortic Atherosclerosis (ICD10-I70.0). Electronically Signed   By: Anner Crete M.D.   On: 01/24/2020 17:22   CT Abdomen Pelvis W Contrast  Result Date: 12/18/2019 CLINICAL DATA:  Restaging non-small cell lung cancer. Status post chemotherapy and  XRT. EXAM: CT CHEST, ABDOMEN, AND PELVIS WITH CONTRAST TECHNIQUE: Multidetector CT imaging of the chest, abdomen and pelvis was performed following the standard protocol during bolus administration of intravenous contrast. CONTRAST:  153mL OMNIPAQUE IOHEXOL 300 MG/ML  SOLN COMPARISON:  PET-CT 09/17/2019 FINDINGS: CT CHEST FINDINGS Cardiovascular: The heart is upper limits of normal in size. No significant pericardial effusion. Aortic atherosclerosis. Coronary artery calcification identified. Mediastinum/Nodes: Normal appearance of the thyroid gland. The trachea appears patent and is midline. Normal appearance of the esophagus. Progression of thoracic adenopathy: Left supraclavicular lymph node measures 1.4 cm, image 11/2. New from previous exam. Right supraclavicular lymph node measures 1.3 cm, image 12/2. Also new from previous exam. Multiple enlarged superior mediastinal and prevascular lymph nodes: Index left pre-vascular lymph node measures 1.1 cm, image 26/2. New from previous exam. Right paratracheal lymph node measures 1 cm, image 19/2. Also new from previous exam. Previously referenced enlarged subcarinal lymph node measures 0.8 cm, image 39/2. On the prior exam this measured 2.3 cm. Conglomeration of enlarged right hilar lymph nodes measured 2.2 x 1.5 cm on today's exam, image 37/2. Previously 3.8 x 3.1 cm. Lungs/Pleura: New right pleural effusion. The right lower lobe lung mass has decreased in size from previous exam. On today's study this measures 3.2 by 3.7 by 4.1 cm, image 48/2 and image 86/4. On the previous exam using the same measurements scheme this measured 5.5  x 7.7 x 6.5 cm Signs of lymphangitic spread of tumor is again noted within the right lower lobe with interlobular septal thickening and nodularity. Musculoskeletal: Lytic lesion involving the anterolateral aspect of the left second rib measures 1.6 cm. On the previous PET-CT there is a focal area of hypermetabolism corresponding to this  lesion without changes on the corresponding CT images. Similarly, there are multiple lytic bone lesions within the thoracic spine which roughly correlate 2 areas of hypermetabolism on previous PET-CT as seen on image 98/5 CT ABDOMEN PELVIS FINDINGS Hepatobiliary: No suspicious subtle, nonspecific area of low attenuation within inferior right hepatic lobe measures 8 mm. This was not confidently noted on previous imaging. Gallbladder unremarkable. Pancreas: Unremarkable. No pancreatic ductal dilatation or surrounding inflammatory changes. Spleen: Normal in size without focal abnormality. 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 Previously 3.3 x 2.1 cm. New right adrenal gland metastasis measures 2.9 x 2.0 cm, image 65/2.Bilateral kidney cysts. No mass or hydronephrosis. Urinary bladder is unremarkable Stomach/Bowel: Stomach is within normal limits. Appendix appears normal. No evidence of bowel wall thickening, distention, or inflammatory changes. Vascular/Lymphatic: Aortic atherosclerosis. No aneurysm. New left retroperitoneal lymph node measures 3.2 x 1.9 cm, image 77/2. No pelvic or inguinal adenopathy. Reproductive: Signs of previous prostatectomy. Other: No free fluid or fluid collections. Musculoskeletal: Multifocal lytic bone metastases are identified. These are new when compared with the previous imaging including 1 cm lesion within the L2 vertebra as well as a 1.4 cm lesion within L3. Multiple bilateral lytic lesions are noted involving the iliac bone which by CT are new. New lucent lesions are identified within bilateral proximal femurs. IMPRESSION: 1. Mixed response to therapy. Interval improvement in right lower lobe lung mass as well as right hilar and subcarinal adenopathy. Interval development of bilateral mediastinal and supraclavicular adenopathy. There is also new left retroperitoneal adenopathy. 2. Interval increase and size of left adrenal gland  metastasis. New right adrenal gland metastasis. 3. 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 By CT these are new when compared with previous PET-CT from 09/17/2019. 4. New right pleural effusion. 5. Coronary artery calcification. 6. Aortic atherosclerosis. Aortic Atherosclerosis (ICD10-I70.0). Electronically Signed   By: Kerby Moors M.D.   On: 12/18/2019 12:36   DG CHEST PORT 1 VIEW  Result Date: 01/23/2020 CLINICAL DATA:  Post thoracentesis EXAM: PORTABLE CHEST 1 VIEW COMPARISON:  Radiograph 11/04/2019 FINDINGS: Patient is post thoracentesis with a decrease in the overall volume of the pleural effusion accounting for differences in positioning from the comparison supine examination. No left effusion. No pneumothorax. Redemonstration of the right infrahilar mass, seen on comparison cross-sectional studies with surrounding heterogeneous opacity. Additional features suggest some mild-to-moderate interstitial edema as well with septal lines, vascular congestion on a background of cardiomegaly. Calcified aorta. No acute osseous or soft tissue abnormality. Degenerative changes are present in the imaged spine and shoulders. Telemetry leads overlie the chest. IMPRESSION: 1. Decrease in the overall volume of the right pleural effusion status post thoracentesis. No pneumothorax. 2. Redemonstrated mass in the right infrahilar region with surrounding heterogeneous opacity. 3. Mild-to-moderate interstitial edema, vascular congestion and cardiomegaly, suggestive of CHF/volume overload as well. Electronically Signed   By: Lovena Le M.D.   On: 01/15/2020 19:32   DG Chest Port 1 View  Result Date: 01/25/2020 CLINICAL DATA:  Stage IV lung cancer on active treatment, sleeping more, slow to respond for 3-4 days, question sepsis EXAM: PORTABLE CHEST 1 VIEW COMPARISON:  Portable exam 1336  hours compared 10/15/2019 FINDINGS: Enlargement of cardiac silhouette with slight pulmonary vascular congestion. Mediastinal  contours normal. Atherosclerotic calcification aorta. Persistent opacity at RIGHT middle lobe with increased RIGHT basilar infiltrate and small pleural effusion. LEFT lung clear. No pneumothorax or acute osseous findings. IMPRESSION: Chronic RIGHT middle lobe opacity with increased RIGHT pleural effusion and RIGHT lower lobe infiltrate. Electronically Signed   By: Lavonia Dana M.D.   On: 01/02/2020 13:58    ASSESSMENT AND PLAN: This is a very pleasant 74 year old white male with a stage IV non-small cell lung cancer with large right hilar mass in addition to mediastinal and supraclavicular lymphadenopathy and metastatic disease to the bone and adrenal glands diagnosed in July 2021. The patient received systemic chemotherapy with carboplatin for AUC of 5, Alimta 500 mg/M2 and Keytruda 200 mg IV every 3 weeks status post 3 cycles started on 10/16/2019.  He tolerated his treatment well except for significant fatigue secondary to chemotherapy-induced anemia. He had repeat CT scan of the chest, abdomen pelvis performed recently. Unfortunately his scan showed evidence for disease progression in multiple areas.  Treatment options were discussed with the patient including second line systemic chemotherapy with dose reduced docetaxel 60 mg/m with Cyramza 10 mg/kg every 3 weeks with Neulasta versus palliative care and hospice referral.  The patient opted to pursue second line treatment.  He received his first cycle on 12/29/2019 and Neulasta on 12/31/2019.  The patient is now admitted to the hospital with acute hypoxic respiratory failure due to his underlying lung disease, malignancy, pneumonia, and pleural effusion.  Status post thoracentesis on 11/9 with improvement of his breathing.  CT of the chest performed on admission did show some increase in the right perihilar mass.  The patient just received his first cycle of second line chemotherapy on 11/3 due to known disease progression.  It would be too soon for  chemotherapy to show a difference on CT scan.  We will follow up with a restaging CT scan of the chest after 3-4 cycles of chemotherapy.  The patient has anemia and thrombocytopenia due to recent chemotherapy.  His hemoglobin is 8.9 today and no transfusion is needed.  Transfuse PRBCs for hemoglobin less than 8.  Platelet count 77,000 and no platelet transfusion is indicated.  Transfuse platelets for platelet count less than 20,000.  Please call oncology for questions.  We will plan to see Mr. Schueller as an outpatient as previously scheduled.  The patient will be seen by the palliative care team while he is in the hospital for ongoing goals of care discussion.   LOS: 2 days   Mikey Bussing, DNP, AGPCNP-BC, AOCNP 01/06/20

## 2020-01-06 NOTE — Progress Notes (Addendum)
NAME:  Jerry Wong, MRN:  [id number], DOB:  06-13-1945, LOS: 2 ADMISSION DATE:  01/25/2020, CONSULTATION DATE: 01/11/2020 REFERRING MD: Dr. Jimmye Norman, CHIEF COMPLAINT: Respiratory failure  Brief History   74 y/o M with non-small cell metastatic cancer for which he is on chemotherapy s/p several courses with recent chemotherapy change admitted 11/9 with 3-4 day history of shortness of breath and decreased mental status. Found to have a large right pleural effusion, s/p thoracentesis with 1L fluid removed.   Past Medical History  Arthritis  CKD  Renal Stones  HTN  HLD  Prostate [facility] Events   11/9 Admit with SOB, s/p thora 11/10 Looks better, off bipap. In AF  Consults:    Procedures:     Significant Diagnostic Tests:  CXR 11/9 >> large right pleural effusion CT Chest 11/9 >> large pleural effusion and lung mass Thora 11/9 >> exudative by LDH  Micro Data:  COVID 11/9 >> negative  MRSA PCR 11/9 > negative  UC 11/9 >> negative  Pleural Fluid Culture 11/9 >>  BCx2 11/9 >>   Antimicrobials:  Cefepime 11/9 >> Vancomycin 11/9 >> 11/10  Interim history/subjective:  Afebrile  Pt asking for water, lips bleeding/dry Denies SOB, pain  Objective   Blood pressure 109/62, pulse 98, temperature 98.5 F (36.9 C), temperature source Oral, resp. rate (!) 28, height 5\' 9"  (1.753 m), weight 68.9 kg, SpO2 99 %.        Intake/Output Summary (Last 24 hours) at 01/06/2020 0844 Last data filed at 01/06/2020 0500 Gross per 24 hour  Intake 641.86 ml  Output 2500 ml  Net -1858.14 ml   Filed Weights   01/01/2020 2042 01/05/20 0500 01/06/20 0500  Weight: 69.5 kg 69.5 kg 68.9 kg    Examination: General: chronically ill appearing adult male lying in bed in NAD   HEENT: MM pink/moist, thick brown secretions in mouth, bloody secretions on lips, anicteric, [place] O2 Neuro: awake, alert, speech clear, MAE, generalized weakness  CV: s1s2 irr irr, AF on  monitor, no m/r/g PULM: non-labored on [place] O2, lungs bilaterally with coarse rhonchi, wheezing GI: soft, bsx4 active  Extremities: warm/dry, BLE 1-2+ pitting edema  Skin: no rashes or lesions   Resolved Hospital Problem list     Assessment & Plan:   Acute hypoxic respiratory failure PNA Multifactorial in setting of underlying lung disease, malignancy pneumonia and pleural effusion  -O2 for sats >88% -follow intermittent CXR   Atrial fibrillation -continue anticoagulation with lovenox  -plan for DOAC once consistently able to take PO's  -transition to oral cardizem when able to take PO's consistently   Sepsis Recently on chemotherapy and steroids. Suspect secondary to PNA.  -cefepime D3/x  -follow cultures / fever curve   Metastatic lung cancer -per ONC / primary   Large pleural effusion -follow pleural fluid cultures  -repeat thora 11/11   Goals of care discussion with spouse -DNR / DNI -discussed the concept of returning home with palliative care > will need more discussion   Chronic Pain, Insomnia  -resume home zanaflex, ambien, remeron when able   Suspected Dysphagia  -SLP evaluation for modified diet if needed  -NPO x sips with meds / ice chips for now    Best practice:  Diet: NPO Pain/Anxiety/Delirium protocol (if indicated):  VAP protocol (if indicated): N/A DVT prophylaxis: Lovenox GI prophylaxis: None Glucose control:  Mobility: Bedrest Code Status: DNI/DNR Family Communication: Patient updated on plan of care 11/11.  Called  wife for consent for thoracentesis. Pt agreeable but wife informed as well. Will obtain consent on her arrival.   Disposition: SDU  PCCM will be available PRN.  Please call back if new needs arise.   Labs   CBC: Recent Labs  Lab 01/03/2020 1330 01/05/20 0025  WBC 7.7 4.6  NEUTROABS 6.2 3.3  HGB 10.0* 8.6*  HCT 32.8* 27.7*  MCV 98.5 97.2  PLT 106* 82*    Basic Metabolic Panel: Recent Labs  Lab 01/13/2020 1330  01/05/20 0025  NA 143 141  K 4.8 4.0  CL 109 110  CO2 20* 16*  GLUCOSE 131* 139*  BUN 51* 49*  CREATININE 1.11 1.03  CALCIUM 8.9 7.8*   GFR: Estimated Creatinine Clearance: 63.2 mL/min (by C-G formula based on SCr of 1.03 mg/dL). Recent Labs  Lab 12/28/2019 1330 01/01/2020 1739 01/14/2020 2244 01/05/20 0025  WBC 7.7  --   --  4.6  LATICACIDVEN 2.9* 3.4* 3.7* 3.0*    Liver Function Tests: Recent Labs  Lab 12/31/2019 1330  AST 62*  ALT 62*  ALKPHOS 212*  BILITOT 1.3*  PROT 6.9  ALBUMIN 2.3*   No results for input(s): LIPASE, AMYLASE in the last 168 hours. No results for input(s): AMMONIA in the last 168 hours.  ABG    Component Value Date/Time   PHART 7.492 (H) 01/19/2020 1317   PCO2ART 25.7 (L) 12/30/2019 1317   PO2ART 214 (H) 12/29/2019 1317   HCO3 19.5 (L) 12/28/2019 1317   ACIDBASEDEF 2.6 (H) 12/29/2019 1317   O2SAT 99.2 01/03/2020 1317     Coagulation Profile: Recent Labs  Lab 01/25/2020 1330  INR 1.3*    Cardiac Enzymes: No results for input(s): CKTOTAL, CKMB, CKMBINDEX, TROPONINI in the last 168 hours.  HbA1C: Hgb A1c MFr Bld  Date/Time Value Ref Range Status  12/25/2018 12:20 PM 6.2 4.6 - 6.5 % Final    Comment:    Glycemic Control Guidelines for People with Diabetes:Non Diabetic:  <6%Goal of Therapy: <7%Additional Action Suggested:  >8%   12/22/2017 01:03 PM 6.1 4.6 - 6.5 % Final    Comment:    Glycemic Control Guidelines for People with Diabetes:Non Diabetic:  <6%Goal of Therapy: <7%Additional Action Suggested:  >8%     CBG: No results for input(s): GLUCAP in the last 168 hours.        [name], MSN, NP-C, AGACNP-BC [facility] 01/06/2020, 8:44 AM   Please see [email or web address] for pager details.

## 2020-01-06 NOTE — Progress Notes (Signed)
PROGRESS NOTE    Jerry Wong  BMW:[id number] DOB: 1945/08/31 DOA: 01/01/2020 PCP: Pleas Koch, NP    Chief Complaint  Patient presents with  . 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 Progressed on first line therapy, received second line therapy on   Subjective:   Remains very weak with ongoing confusion, continued aspiration risk Currently on 4liter o2 supplement RN reports diarrhea, he denies ab pain  Assessment & Plan:   Active Problems:   Sepsis ([place])   Pressure injury of skin   Malnutrition of moderate degree  Acute hypoxic respiratory failure/pneumonia/sepsis on presentation -With stage IV non-small cell lung cancer, large pleural effusion status post thoracentesis about a liter initially on 11/9, another bedside thoracentesis on 11/11 with 1400 cc's of clear yellow fluid was drained from the right pleural space -Sepsis secondary to pneumonia, Vanco discontinued, he is continued on cefepime, lactic acid normalized  -He is currently off BiPAP, on high flow nasal cannula -appreciate critical care input  Risk of aspiration  intermittent confusion, generalized decondition Speech therapy on board Continue goals of care discussion  Acute metabolic encephalopathy Ct head on 11/9 on acute findings He is oriented to person, year, not to the months, and place He is calm and cooperative Treat underline causes  A. Fib, new diagnosis vs frequent pac's and pvc's -is currently on Cardizem drip, therapeutic Lovenox  Right lower extremity edema Will get venous doppler to rule out DVT  Hypokalemia, replace K  Stage IV (T4, N3, M1 C) non-small cell lung cancer presented with large right hilar mass in addition to mediastinal lymphadenopathy with bilateral  adrenal metastasis and bony metastatic disease diagnosed in July 2021. progressed on first line treatment Second line systemic chemotherapy with docetaxel 60 mg/M2 and Cyramza 10 mg/KG every 3 weeks with Neulasta support.  First dose December 28, 2019. Oncology and palliative care consulted for cancer care and goals of care discussion  Anemia and thrombocytopenia in the setting of malignancy -He received a blood transfusion prior to recent chemotherapy -No blood transfusion needed during this hospitalization -Monitor CBC  FTT/generalized deconditioning, malnutrition Body mass index is 22.43 kg/m.  Needs goals of care discussion, oncology Dr Julien Nordmann agrees.      DVT prophylaxis: SCDs Start: 01/24/2020 1842   Code Status:DNR Family Communication: patient Disposition:   Status is: Inpatient  Dispo: The patient is from: home              Anticipated d/c is to: TBD              Anticipated d/c date is: TBD                Consultants:   Critical care  Oncology  Palliative care  Procedures:   thoracentesis  Antimicrobials:   As above     Objective: Vitals:   01/06/20 0300 01/06/20 0400 01/06/20 0500 01/06/20 0600  BP: (!) 119/59 (!) 144/67 118/70 109/62  Pulse: 100 (!) 107 81 98  Resp: (!) 29 (!) 25 (!) 31 (!) 28  Temp:    98.5 F (36.9 C)  TempSrc:    Oral  SpO2: 97% 96% 97% 99%  Weight:   68.9 kg   Height:        Intake/Output Summary (Last 24 hours) at 01/06/2020  Lucien filed at 01/06/2020 0500 Gross per 24 hour  Intake 641.86 ml  Output 2500 ml  Net -1858.14 ml   Filed Weights   01/16/2020 2042 01/05/20 0500 01/06/20 0500  Weight: 69.5 kg 69.5 kg 68.9 kg    Examination:  General exam: very weak, frail, alert, interative Respiratory system: diminished on the right.poor Respiratory effort , tachypnea Cardiovascular system:tachycardia Gastrointestinal system: Abdomen is nondistended, soft and nontender.  Normal bowel sounds  heard. Central nervous system: Alert and oriented to person, confused about time and place Extremities: generalized weakness, not able to lift legs against gravities, right pedal /ankle pitting edema, right food drop Skin: No rashes, lesions or ulcers Psychiatry: confused but cooperative    Data Reviewed: I have personally reviewed following labs and imaging studies  CBC: Recent Labs  Lab 12/29/2019 1330 01/05/20 0025  WBC 7.7 4.6  NEUTROABS 6.2 3.3  HGB 10.0* 8.6*  HCT 32.8* 27.7*  MCV 98.5 97.2  PLT 106* 82*    Basic Metabolic Panel: Recent Labs  Lab 01/14/2020 1330 01/05/20 0025  NA 143 141  K 4.8 4.0  CL 109 110  CO2 20* 16*  GLUCOSE 131* 139*  BUN 51* 49*  CREATININE 1.11 1.03  CALCIUM 8.9 7.8*    GFR: Estimated Creatinine Clearance: 63.2 mL/min (by C-G formula based on SCr of 1.03 mg/dL).  Liver Function Tests: Recent Labs  Lab 01/16/2020 1330  AST 62*  ALT 62*  ALKPHOS 212*  BILITOT 1.3*  PROT 6.9  ALBUMIN 2.3*    CBG: No results for input(s): GLUCAP in the last 168 hours.   Recent Results (from the past 240 hour(s))  Blood culture (routine x 2)     Status: None (Preliminary result)   Collection Time: 01/05/2020  1:29 PM   Specimen: BLOOD  Result Value Ref Range Status   Specimen Description   Final    BLOOD RIGHT ANTECUBITAL Performed at [facility] [address] [place], [place] [address]    Special Requests   Final    BOTTLES DRAWN AEROBIC AND ANAEROBIC Blood Culture adequate volume Performed at [facility] [address] [place], [place] [address]    Culture   Final    NO GROWTH 2 DAYS Performed at [facility] [address] [place], [place] [address]    Report Status PENDING  Incomplete  Blood culture (routine x 2)     Status: None (Preliminary result)   Collection Time: 01/15/2020  1:30 PM   Specimen: BLOOD RIGHT HAND  Result Value Ref Range Status   Specimen Description   Final     BLOOD RIGHT HAND Performed at [facility] [address], [place], [place] [address]    Special Requests   Final    BOTTLES DRAWN AEROBIC AND ANAEROBIC Blood Culture results may not be optimal due to an inadequate volume of blood received in culture bottles Performed at [facility] [address] [place], [place] [address]    Culture   Final    NO GROWTH 2 DAYS Performed at [facility] [address] [place], [place] [address]    Report Status PENDING  Incomplete  Respiratory Panel by RT PCR (Flu A&B, Covid) - Nasopharyngeal Swab     Status: None   Collection Time: 01/16/2020  2:11 PM   Specimen: Nasopharyngeal Swab  Result Value Ref Range Status   SARS Coronavirus 2 by RT PCR NEGATIVE  NEGATIVE Final    Comment: (NOTE) SARS-CoV-2 target nucleic acids are NOT DETECTED.  The SARS-CoV-2 RNA is generally detectable in upper respiratoy specimens during the acute phase of infection. The lowest concentration of SARS-CoV-2 viral copies this assay can detect is 131 copies/mL. A negative result does not preclude SARS-Cov-2 infection and should not be used as the sole basis for treatment or other patient management decisions. A negative result may occur with  improper specimen collection/handling, submission of specimen other than nasopharyngeal swab, presence of viral mutation(s) within the areas targeted by this assay, and inadequate number of viral copies (<131 copies/mL). A negative result must be combined with clinical observations, patient history, and epidemiological information. The expected result is Negative.  Fact Sheet for Patients:  PinkCheek.be  Fact Sheet for Healthcare Providers:  GravelBags.it  This test is no t yet approved or cleared by the [place] FDA and  has been authorized for detection and/or diagnosis of SARS-CoV-2 by FDA under an Emergency Use  Authorization (EUA). This EUA will remain  in effect (meaning this test can be used) for the duration of the COVID-19 declaration under Section 564(b)(1) of the Act, 21 U.S.C. section 360bbb-3(b)(1), unless the authorization is terminated or revoked sooner.     Influenza A by PCR NEGATIVE NEGATIVE Final   Influenza B by PCR NEGATIVE NEGATIVE Final    Comment: (NOTE) The Xpert Xpress SARS-CoV-2/FLU/RSV assay is intended as an aid in  the diagnosis of influenza from Nasopharyngeal swab specimens and  should not be used as a sole basis for treatment. Nasal washings and  aspirates are unacceptable for Xpert Xpress SARS-CoV-2/FLU/RSV  testing.  Fact Sheet for Patients: PinkCheek.be  Fact Sheet for Healthcare Providers: GravelBags.it  This test is not yet approved or cleared by the [place] FDA and  has been authorized for detection and/or diagnosis of SARS-CoV-2 by  FDA under an Emergency Use Authorization (EUA). This EUA will remain  in effect (meaning this test can be used) for the duration of the  Covid-19 declaration under Section 564(b)(1) of the Act, 21  U.S.C. section 360bbb-3(b)(1), unless the authorization is  terminated or revoked. Performed at [facility], [place] [address] [place], [place] [address]   Urine culture     Status: None   Collection Time: 01/08/2020  2:54 PM   Specimen: In/Out Cath Urine  Result Value Ref Range Status   Specimen Description   Final    IN/OUT CATH URINE Performed at [facility] [address], [place], [place] [address]    Special Requests   Final    NONE Performed at [facility], [place] [address], [place], [place] [address]    Culture   Final    NO GROWTH Performed at [facility], [place] [address], [place], [place] [address]    Report Status 01/06/2020 FINAL  Final  Body fluid culture (includes gram stain)      Status: None (Preliminary result)   Collection Time: 01/21/2020  7:17 PM   Specimen: Pleural Fluid  Result Value Ref Range Status   Specimen Description   Final    PLEURAL Performed at [facility] [address], [place], [place] [address]    Special Requests   Final    NONE Performed at [facility], [place] [address] [place], [place] [address]    Gram Stain   Final    FEW WBC PRESENT,BOTH PMN AND MONONUCLEAR NO ORGANISMS  SEEN    Culture   Final    NO GROWTH < 12 HOURS Performed at [facility], [place] [address] [place], [place] [address]    Report Status PENDING  Incomplete  MRSA PCR Screening     Status: None   Collection Time: 01/10/2020  8:51 PM   Specimen: Nasopharyngeal  Result Value Ref Range Status   MRSA by PCR NEGATIVE NEGATIVE Final    Comment:        The GeneXpert MRSA Assay (FDA approved for NASAL specimens only), is one component of a comprehensive MRSA colonization surveillance program. It is not intended to diagnose MRSA infection nor to guide or monitor treatment for MRSA infections. Performed at [facility], [place] [address] [place],  [address]          Radiology Studies: CT Head Wo Contrast  Result Date: 12/31/2019 CLINICAL DATA:  Altered mental status.  Lung carcinoma EXAM: CT HEAD WITHOUT CONTRAST TECHNIQUE: Contiguous axial images were obtained from the base of the skull through the vertex without intravenous contrast. COMPARISON:  None. FINDINGS: Brain: There is mild age related volume loss. There is no intracranial mass, hemorrhage, extra-axial fluid collection, or midline shift. There is mild small vessel disease in the centra semiovale bilaterally. Elsewhere brain parenchyma appears unremarkable. No evident acute infarct. Vascular: No hyperdense vessel. There is calcification in each carotid siphon region. Skull: Bony calvarium appears intact. Sinuses/Orbits: Paranasal sinuses  are clear. Orbits appear symmetric bilaterally. Other: Mastoid air cells are clear. IMPRESSION: Age related volume loss with slight periventricular small vessel disease. No mass or hemorrhage. No acute infarct. Foci of arterial vascular calcification noted. Electronically Signed   By: Lowella Grip III M.D.   On: 01/23/2020 17:01   CT Chest W Contrast  Result Date: 01/18/2020 CLINICAL DATA:  74 year old male with abdominal pain. Stage IV lung cancer. EXAM: CT CHEST, ABDOMEN, AND PELVIS WITH CONTRAST TECHNIQUE: Multidetector CT imaging of the chest, abdomen and pelvis was performed following the standard protocol during bolus administration of intravenous contrast. CONTRAST:  15mL OMNIPAQUE IOHEXOL 300 MG/ML  SOLN COMPARISON:  CT of the chest abdomen pelvis dated 12/17/2019. FINDINGS: Evaluation of this exam is limited due to respiratory motion artifact. CT CHEST FINDINGS Cardiovascular: There is mild cardiomegaly. No pericardial effusion. There is retrograde flow of contrast from the right atrium into the IVC suggestive of right heart dysfunction. There is mild atherosclerotic calcification of thoracic aorta. No aneurysmal dilatation or dissection. The origins of the great vessels of the aortic arch appear patent as visualized. The central pulmonary arteries appear patent. Mediastinum/Nodes: Interval decrease in the size of the lymph nodes in the prevascular space compared to prior CT. Slightly decreased in the size of the lymph node anterior to the carina measuring approximately 8 mm in short axis (previously 11 mm). Decrease in the size of level IV lymph nodes compared to the prior CT measuring up to 11 mm in short axis on the right (8/3) previously measuring 14 mm. The esophagus and the thyroid gland are grossly unremarkable. No mediastinal fluid collection. Lungs/Pleura: There is a moderate size right pleural effusion, increased in size since the prior CT. There is associated compressive atelectasis of  the majority of the right lower lobe, although pneumonia is not excluded. There is a 7.6 x 5.7 cm (previously 4.8 x 3.3) mass in the right perihilar region along the major fissure. Patchy ground-glass density and diffuse interstitial thickening with associated air bronchogram in the right  upper and right middle lobes is new since the prior CT and may represent pneumonia or further extension of tumor. Clinical correlation is recommended. No pneumothorax. There is mass effect and compression of the bronchus intermedius and right middle and right lower lobe bronchi. There is complete occlusion of the right lower lobe bronchus just after bifurcation of the bronchus intermedius. Musculoskeletal: There is a mildly enlarged lymph node in the left axilla measuring 11 mm in short axis. There is a lytic lesion involving the left second rib with associated pathologic fracture also seen on the prior CT. There is a lytic lesion involving the right humeral head, present on the prior CT. Multiple additional lytic lesions involving the upper sternum, C7, T1, and T2 relatively similar to prior CT. CT ABDOMEN PELVIS FINDINGS No intra-abdominal free air or free fluid. Hepatobiliary: The liver and gallbladder appear unremarkable. Pancreas: Unremarkable. No pancreatic ductal dilatation or surrounding inflammatory changes. Spleen: Normal in size without focal abnormality. Adrenals/Urinary Tract: Bilateral adrenal masses measuring approximately 4.6 x 4.1 cm on the left (previously 4.9 x 3.9 cm) and 2.4 x 1.9 cm on the right (previously 2.9 x 2.0 cm). There is mild bilateral renal parenchyma atrophy. Multiple bilateral renal cysts and smaller hypodense lesions which are too small to characterize. There is no hydronephrosis on either side. There is symmetric enhancement of the renal parenchyma. The visualized ureters and urinary bladder appear unremarkable. Stomach/Bowel: There are scattered sigmoid diverticula without active inflammatory  changes. There is no bowel obstruction or active inflammation. The appendix is normal. Vascular/Lymphatic: Moderate aortoiliac atherosclerotic disease. The IVC is unremarkable. No portal venous gas. Left para-aortic/retroperitoneal adenopathy measuring 1.6 cm in short axis (previously 1.9 cm). Reproductive: The prostate is not visualized. Other: Midline vertical anterior pelvic wall incisional scar. Musculoskeletal: Multiple osseous lytic lesions relatively similar to prior CT. No acute osseous pathology. IMPRESSION: 1. Interval increase in the size of the right perihilar mass compared to the prior CT. There is associated mass effect and compression of the bronchus intermedius and right middle and right lower lobe bronchi. There is complete occlusion of the right lower lobe bronchus just after bifurcation of the bronchus intermedius. 2. Patchy area of ground-glass and interstitial density in the right upper and right middle lobe, new since the prior CT. This may represent pneumonia or tumoral extension. 3. Overall interval decrease in the cervical, mediastinal, and retroperitoneal nodal disease as well as decrease in the size of the adrenal metastasis. 4. Extensive osseous metastases relatively similar to prior CT. 5. Interval increase in the size of the right pleural effusion with associated compressive atelectasis of the majority of the right lower lobe. 6. No bowel obstruction. Normal appendix. 7. Aortic Atherosclerosis (ICD10-I70.0). Electronically Signed   By: Anner Crete M.D.   On: 01/06/2020 17:22   CT ABDOMEN PELVIS W CONTRAST  Result Date: 01/03/2020 CLINICAL DATA:  74 year old male with abdominal pain. Stage IV lung cancer. EXAM: CT CHEST, ABDOMEN, AND PELVIS WITH CONTRAST TECHNIQUE: Multidetector CT imaging of the chest, abdomen and pelvis was performed following the standard protocol during bolus administration of intravenous contrast. CONTRAST:  129mL OMNIPAQUE IOHEXOL 300 MG/ML  SOLN  COMPARISON:  CT of the chest abdomen pelvis dated 12/17/2019. FINDINGS: Evaluation of this exam is limited due to respiratory motion artifact. CT CHEST FINDINGS Cardiovascular: There is mild cardiomegaly. No pericardial effusion. There is retrograde flow of contrast from the right atrium into the IVC suggestive of right heart dysfunction. There is mild atherosclerotic calcification of thoracic aorta.  No aneurysmal dilatation or dissection. The origins of the great vessels of the aortic arch appear patent as visualized. The central pulmonary arteries appear patent. Mediastinum/Nodes: Interval decrease in the size of the lymph nodes in the prevascular space compared to prior CT. Slightly decreased in the size of the lymph node anterior to the carina measuring approximately 8 mm in short axis (previously 11 mm). Decrease in the size of level IV lymph nodes compared to the prior CT measuring up to 11 mm in short axis on the right (8/3) previously measuring 14 mm. The esophagus and the thyroid gland are grossly unremarkable. No mediastinal fluid collection. Lungs/Pleura: There is a moderate size right pleural effusion, increased in size since the prior CT. There is associated compressive atelectasis of the majority of the right lower lobe, although pneumonia is not excluded. There is a 7.6 x 5.7 cm (previously 4.8 x 3.3) mass in the right perihilar region along the major fissure. Patchy ground-glass density and diffuse interstitial thickening with associated air bronchogram in the right upper and right middle lobes is new since the prior CT and may represent pneumonia or further extension of tumor. Clinical correlation is recommended. No pneumothorax. There is mass effect and compression of the bronchus intermedius and right middle and right lower lobe bronchi. There is complete occlusion of the right lower lobe bronchus just after bifurcation of the bronchus intermedius. Musculoskeletal: There is a mildly enlarged lymph  node in the left axilla measuring 11 mm in short axis. There is a lytic lesion involving the left second rib with associated pathologic fracture also seen on the prior CT. There is a lytic lesion involving the right humeral head, present on the prior CT. Multiple additional lytic lesions involving the upper sternum, C7, T1, and T2 relatively similar to prior CT. CT ABDOMEN PELVIS FINDINGS No intra-abdominal free air or free fluid. Hepatobiliary: The liver and gallbladder appear unremarkable. Pancreas: Unremarkable. No pancreatic ductal dilatation or surrounding inflammatory changes. Spleen: Normal in size without focal abnormality. Adrenals/Urinary Tract: Bilateral adrenal masses measuring approximately 4.6 x 4.1 cm on the left (previously 4.9 x 3.9 cm) and 2.4 x 1.9 cm on the right (previously 2.9 x 2.0 cm). There is mild bilateral renal parenchyma atrophy. Multiple bilateral renal cysts and smaller hypodense lesions which are too small to characterize. There is no hydronephrosis on either side. There is symmetric enhancement of the renal parenchyma. The visualized ureters and urinary bladder appear unremarkable. Stomach/Bowel: There are scattered sigmoid diverticula without active inflammatory changes. There is no bowel obstruction or active inflammation. The appendix is normal. Vascular/Lymphatic: Moderate aortoiliac atherosclerotic disease. The IVC is unremarkable. No portal venous gas. Left para-aortic/retroperitoneal adenopathy measuring 1.6 cm in short axis (previously 1.9 cm). Reproductive: The prostate is not visualized. Other: Midline vertical anterior pelvic wall incisional scar. Musculoskeletal: Multiple osseous lytic lesions relatively similar to prior CT. No acute osseous pathology. IMPRESSION: 1. Interval increase in the size of the right perihilar mass compared to the prior CT. There is associated mass effect and compression of the bronchus intermedius and right middle and right lower lobe bronchi.  There is complete occlusion of the right lower lobe bronchus just after bifurcation of the bronchus intermedius. 2. Patchy area of ground-glass and interstitial density in the right upper and right middle lobe, new since the prior CT. This may represent pneumonia or tumoral extension. 3. Overall interval decrease in the cervical, mediastinal, and retroperitoneal nodal disease as well as decrease in the size of the adrenal  metastasis. 4. Extensive osseous metastases relatively similar to prior CT. 5. Interval increase in the size of the right pleural effusion with associated compressive atelectasis of the majority of the right lower lobe. 6. No bowel obstruction. Normal appendix. 7. Aortic Atherosclerosis (ICD10-I70.0). Electronically Signed   By: Anner Crete M.D.   On: 12/27/2019 17:22   DG CHEST PORT 1 VIEW  Result Date: 01/15/2020 CLINICAL DATA:  Post thoracentesis EXAM: PORTABLE CHEST 1 VIEW COMPARISON:  Radiograph 11/04/2019 FINDINGS: Patient is post thoracentesis with a decrease in the overall volume of the pleural effusion accounting for differences in positioning from the comparison supine examination. No left effusion. No pneumothorax. Redemonstration of the right infrahilar mass, seen on comparison cross-sectional studies with surrounding heterogeneous opacity. Additional features suggest some mild-to-moderate interstitial edema as well with septal lines, vascular congestion on a background of cardiomegaly. Calcified aorta. No acute osseous or soft tissue abnormality. Degenerative changes are present in the imaged spine and shoulders. Telemetry leads overlie the chest. IMPRESSION: 1. Decrease in the overall volume of the right pleural effusion status post thoracentesis. No pneumothorax. 2. Redemonstrated mass in the right infrahilar region with surrounding heterogeneous opacity. 3. Mild-to-moderate interstitial edema, vascular congestion and cardiomegaly, suggestive of CHF/volume overload as well.  Electronically Signed   By: Lovena Le M.D.   On: 01/14/2020 19:32   DG Chest Port 1 View  Result Date: 01/25/2020 CLINICAL DATA:  Stage IV lung cancer on active treatment, sleeping more, slow to respond for 3-4 days, question sepsis EXAM: PORTABLE CHEST 1 VIEW COMPARISON:  Portable exam 1336 hours compared 10/15/2019 FINDINGS: Enlargement of cardiac silhouette with slight pulmonary vascular congestion. Mediastinal contours normal. Atherosclerotic calcification aorta. Persistent opacity at RIGHT middle lobe with increased RIGHT basilar infiltrate and small pleural effusion. LEFT lung clear. No pneumothorax or acute osseous findings. IMPRESSION: Chronic RIGHT middle lobe opacity with increased RIGHT pleural effusion and RIGHT lower lobe infiltrate. Electronically Signed   By: Lavonia Dana M.D.   On: 01/03/2020 13:58        Scheduled Meds: . chlorhexidine  15 mL Mouth Rinse BID  . Chlorhexidine Gluconate Cloth  6 each Topical Daily  . enoxaparin (LOVENOX) injection  1 mg/kg Subcutaneous Q12H  . mouth rinse  15 mL Mouth Rinse q12n4p   Continuous Infusions: . ceFEPime (MAXIPIME) IV Stopped (01/06/20 0153)  . diltiazem (CARDIZEM) infusion 5 mg/hr (01/06/20 0400)     LOS: 2 days   Time spent: 84mins Greater than 50% of this time was spent in counseling, explanation of diagnosis, planning of further management, and coordination of care.  I have personally reviewed and interpreted on  01/06/2020 daily labs, tele strips, I reviewed all nursing notes, pharmacy notes, consultant notes,  vitals, pertinent old records  I have discussed plan of care as described above with RN , patient  on 01/06/2020  Voice Recognition /Dragon dictation system was used to create this note, attempts have been made to correct errors. Please contact the author with questions and/or clarifications.   Florencia Reasons, MD PhD FACP [facility]  Available via Epic secure chat 7am-7pm for nonurgent issues Please page  for urgent issues To page the attending provider between 7A-7P or the covering provider during after hours 7P-7A, please log into the web site [email or web address] and access using universal [place] password for that web site. If you do not have the password, please call the hospital operator.    01/06/2020, 8:32 AM

## 2020-01-07 ENCOUNTER — Inpatient Hospital Stay (HOSPITAL_COMMUNITY): Payer: Medicare Other

## 2020-01-07 DIAGNOSIS — R609 Edema, unspecified: Secondary | ICD-10-CM

## 2020-01-07 DIAGNOSIS — J90 Pleural effusion, not elsewhere classified: Secondary | ICD-10-CM | POA: Diagnosis not present

## 2020-01-07 DIAGNOSIS — E440 Moderate protein-calorie malnutrition: Secondary | ICD-10-CM | POA: Diagnosis not present

## 2020-01-07 LAB — COMPREHENSIVE METABOLIC PANEL
ALT: 37 U/L (ref 0–44)
AST: 33 U/L (ref 15–41)
Albumin: 2.4 g/dL — ABNORMAL LOW (ref 3.5–5.0)
Alkaline Phosphatase: 145 U/L — ABNORMAL HIGH (ref 38–126)
Anion gap: 12 (ref 5–15)
BUN: 45 mg/dL — ABNORMAL HIGH (ref 8–23)
CO2: 18 mmol/L — ABNORMAL LOW (ref 22–32)
Calcium: 8.3 mg/dL — ABNORMAL LOW (ref 8.9–10.3)
Chloride: 117 mmol/L — ABNORMAL HIGH (ref 98–111)
Creatinine, Ser: 1.07 mg/dL (ref 0.61–1.24)
GFR, Estimated: >60 mL/min (ref >60)
Glucose, Bld: 187 mg/dL — ABNORMAL HIGH (ref 70–99)
Potassium: 4.1 mmol/L (ref 3.5–5.1)
Sodium: 147 mmol/L — ABNORMAL HIGH (ref 135–145)
Total Bilirubin: 1.7 mg/dL — ABNORMAL HIGH (ref 0.3–1.2)
Total Protein: 5.8 g/dL — ABNORMAL LOW (ref 6.5–8.1)

## 2020-01-07 LAB — CBC WITH DIFFERENTIAL/PLATELET
Abs Immature Granulocytes: 1.91 10*3/uL — ABNORMAL HIGH (ref 0.00–0.07)
Basophils Absolute: 0 10*3/uL (ref 0.0–0.1)
Basophils Relative: 0 %
Eosinophils Absolute: 0 10*3/uL (ref 0.0–0.5)
Eosinophils Relative: 0 %
HCT: 28.5 % — ABNORMAL LOW (ref 39.0–52.0)
Hemoglobin: 9 g/dL — ABNORMAL LOW (ref 13.0–17.0)
Immature Granulocytes: 13 %
Lymphocytes Relative: 3 %
Lymphs Abs: 0.5 10*3/uL — ABNORMAL LOW (ref 0.7–4.0)
MCH: 30.4 pg (ref 26.0–34.0)
MCHC: 31.6 g/dL (ref 30.0–36.0)
MCV: 96.3 fL (ref 80.0–100.0)
Monocytes Absolute: 1.1 10*3/uL — ABNORMAL HIGH (ref 0.1–1.0)
Monocytes Relative: 7 %
Neutro Abs: 11.8 10*3/uL — ABNORMAL HIGH (ref 1.7–7.7)
Neutrophils Relative %: 77 %
Platelets: 66 10*3/uL — ABNORMAL LOW (ref 150–400)
RBC: 2.96 MIL/uL — ABNORMAL LOW (ref 4.22–5.81)
RDW: 22.8 % — ABNORMAL HIGH (ref 11.5–15.5)
WBC: 15.3 10*3/uL — ABNORMAL HIGH (ref 4.0–10.5)
nRBC: 2.9 % — ABNORMAL HIGH (ref 0.0–0.2)

## 2020-01-07 LAB — CHOLESTEROL, BODY FLUID: Cholesterol, Fluid: 34 mg/dL

## 2020-01-07 LAB — LACTIC ACID, PLASMA: Lactic Acid, Venous: 2.2 mmol/L (ref 0.5–1.9)

## 2020-01-07 LAB — MAGNESIUM: Magnesium: 1.9 mg/dL (ref 1.7–2.4)

## 2020-01-07 LAB — CYTOLOGY - NON PAP

## 2020-01-07 MED ORDER — ONDANSETRON 4 MG PO TBDP: 4.0000 mg | ORAL_TABLET | Freq: Four times a day (QID) | ORAL | Status: DC | PRN

## 2020-01-07 MED ORDER — LORAZEPAM 2 MG/ML PO CONC
1.0000 mg | ORAL | Status: DC | PRN
  Filled 2020-01-07: qty 0.5

## 2020-01-07 MED ORDER — HALOPERIDOL LACTATE 5 MG/ML IJ SOLN
0.5000 mg | INTRAMUSCULAR | Status: DC | PRN
  Administered 2020-01-07: 0.5 mg via INTRAVENOUS
  Filled 2020-01-07: qty 1

## 2020-01-07 MED ORDER — HALOPERIDOL LACTATE 2 MG/ML PO CONC
0.5000 mg | ORAL | Status: DC | PRN
  Filled 2020-01-07: qty 0.3

## 2020-01-07 MED ORDER — GLYCOPYRROLATE 1 MG PO TABS
1.0000 mg | ORAL_TABLET | ORAL | Status: DC | PRN
  Filled 2020-01-07: qty 1

## 2020-01-07 MED ORDER — GLYCOPYRROLATE 0.2 MG/ML IJ SOLN
0.2000 mg | INTRAMUSCULAR | Status: DC | PRN
  Administered 2020-01-07: 0.2 mg via INTRAVENOUS
  Filled 2020-01-07 (×2): qty 1

## 2020-01-07 MED ORDER — ALBUTEROL SULFATE (2.5 MG/3ML) 0.083% IN NEBU: 2.5000 mg | INHALATION_SOLUTION | RESPIRATORY_TRACT | Status: DC | PRN

## 2020-01-07 MED ORDER — MORPHINE SULFATE (PF) 2 MG/ML IV SOLN
2.0000 mg | INTRAVENOUS | Status: DC | PRN
  Administered 2020-01-07: 2 mg via INTRAVENOUS
  Filled 2020-01-07: qty 1

## 2020-01-07 MED ORDER — ACETAMINOPHEN 325 MG PO TABS: 650.0000 mg | ORAL_TABLET | Freq: Four times a day (QID) | ORAL | Status: DC | PRN

## 2020-01-07 MED ORDER — MORPHINE 100MG IN NS 100ML (1MG/ML) PREMIX INFUSION
1.0000 mg/h | INTRAVENOUS | Status: DC
  Administered 2020-01-07: 1 mg/h via INTRAVENOUS
  Administered 2020-01-08: 5 mg/h via INTRAVENOUS
  Administered 2020-01-09: 7 mg/h via INTRAVENOUS
  Administered 2020-01-09: 2 mg/h via INTRAVENOUS
  Administered 2020-01-09: 6 mg/h via INTRAVENOUS
  Administered 2020-01-10: 7 mg/h via INTRAVENOUS
  Administered 2020-01-11: 8 mg/h via INTRAVENOUS
  Administered 2020-01-11: 7 mg/h via INTRAVENOUS
  Administered 2020-01-12: 9 mg/h via INTRAVENOUS
  Filled 2020-01-07 (×8): qty 100

## 2020-01-07 MED ORDER — LORAZEPAM 1 MG PO TABS: 1.0000 mg | ORAL_TABLET | ORAL | Status: DC | PRN

## 2020-01-07 MED ORDER — ACETAMINOPHEN 650 MG RE SUPP: 650.0000 mg | Freq: Four times a day (QID) | RECTAL | Status: DC | PRN

## 2020-01-07 MED ORDER — SODIUM CHLORIDE 0.9 % IV SOLN
12.5000 mg | Freq: Four times a day (QID) | INTRAVENOUS | Status: DC | PRN
  Filled 2020-01-07: qty 0.5

## 2020-01-07 MED ORDER — LORAZEPAM 2 MG/ML IJ SOLN
0.5000 mg | Freq: Four times a day (QID) | INTRAMUSCULAR | Status: DC | PRN
  Administered 2020-01-07: 0.5 mg via INTRAVENOUS

## 2020-01-07 MED ORDER — MORPHINE SULFATE (PF) 2 MG/ML IV SOLN
2.0000 mg | INTRAVENOUS | Status: DC | PRN
  Administered 2020-01-07 – 2020-01-09 (×4): 2 mg via INTRAVENOUS
  Filled 2020-01-07: qty 1

## 2020-01-07 MED ORDER — HALOPERIDOL 0.5 MG PO TABS
0.5000 mg | ORAL_TABLET | ORAL | Status: DC | PRN
  Filled 2020-01-07: qty 1

## 2020-01-07 MED ORDER — ONDANSETRON HCL 4 MG/2ML IJ SOLN: 4.0000 mg | Freq: Four times a day (QID) | INTRAMUSCULAR | Status: DC | PRN

## 2020-01-07 MED ORDER — GLYCOPYRROLATE 0.2 MG/ML IJ SOLN
0.2000 mg | INTRAMUSCULAR | Status: DC | PRN
  Filled 2020-01-07: qty 1

## 2020-01-07 MED ORDER — LORAZEPAM 2 MG/ML IJ SOLN
INTRAMUSCULAR | Status: AC
  Filled 2020-01-07: qty 1

## 2020-01-07 MED ORDER — LORAZEPAM 2 MG/ML IJ SOLN
1.0000 mg | INTRAMUSCULAR | Status: DC | PRN
  Administered 2020-01-07 – 2020-01-08 (×2): 1 mg via INTRAVENOUS
  Filled 2020-01-07 (×2): qty 1

## 2020-01-07 MED ORDER — POLYVINYL ALCOHOL 1.4 % OP SOLN
1.0000 [drp] | Freq: Four times a day (QID) | OPHTHALMIC | Status: DC | PRN
  Administered 2020-01-09: 1 [drp] via OPHTHALMIC
  Filled 2020-01-07: qty 15

## 2020-01-07 NOTE — Progress Notes (Addendum)
PROGRESS NOTE    Jerry Wong  TIW:[id number] DOB: [birth year] DOA: 01/03/2020 PCP: Pleas Koch, NP    Chief Complaint  Patient presents with  . 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 Progressed on first line therapy, received second line therapy on   Subjective:  Patient has significant change from yesterday, he is tachypnea, less interactive Wife at bedside  Assessment & Plan:   Active Problems:   Sepsis ([place])   Pressure injury of skin   Malnutrition of moderate degree   Pleural effusion  Acute hypoxic respiratory failure/pneumonia/sepsis on presentation -With stage IV non-small cell lung cancer, large pleural effusion status post thoracentesis about a liter initially on 11/9, another bedside thoracentesis on 11/11 with 1400 cc's of clear yellow fluid was drained from the right pleural space -Sepsis secondary to pneumonia, Vanco discontinued, he is continued on cefepime, lactic acid normalized  -He is currently off BiPAP, on high flow nasal cannula -appreciate critical care input, both critical care and oncology recommend palliative care consult , consult order placed yesterday  -patient was having a good day yesterday , was interactive and following commands, but today, he is less responsive tachypnea, am labs got worse, clinically suspect another aspiration event lead to deterioration, has long conversation with wife at bedside in the setting of stage IV cancer not responding to chemo, not he can not tolerate chemo, he is too weak to take oral safely without risking aspiration, clinically he likely suffered another aspiration event while on abx , overall prognosis is guarded, wife agrees with residential hospice , will consult case manager .  Risk of  aspiration  intermittent confusion, generalized decondition Speech therapy on board Continue goals of care discussion, likely shift to full comfort measures  Acute metabolic encephalopathy Ct head on 11/9 on acute findings He is oriented to person, year, not to the months, and place, calm and cooperative on 11/11,clinically deteriorated today, likely shift to full comfort measures   A. 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 progressed on first line treatment Second line systemic chemotherapy with docetaxel 60 mg/M2 and Cyramza 10 mg/KG every 3 weeks with Neulasta support.  First dose December 28, 2019. Oncology and palliative care consulted for cancer care and goals of care discussion shift to full comfort measures  Anemia and thrombocytopenia in the setting of malignancy -He received a blood transfusion prior to recent chemotherapy -No blood transfusion needed during this hospitalization -shift to full comfort measures  FTT/generalized deconditioning, malnutrition Body mass index is 21.91 kg/m.  Needs goals of care discussion, oncology Dr Julien Nordmann agrees.    Pm addendum: 5:54pm, patient started to have increased discomfort and continued decreased mentation, I have discussed with wife , now transition to full comfort  Measures     DVT prophylaxis: SCDs Start: 01/19/2020 1842   Code Status:DNR Family Communication: wife at bedside  Disposition:   Status is: Inpatient  Dispo: The patient is from: home  Anticipated d/c is to: inpatient death vs residential  hospice                                 Consultants:   Critical care  Oncology  Palliative care  Procedures:   thoracentesis  Antimicrobials:   As above     Objective: Vitals:   01/07/20 0600 01/07/20 0700 01/07/20 0750 01/07/20 0800  BP: 112/85 116/73  126/76  Pulse: (!) 107 (!) 101  95  Resp: (!) 27 (!) 28  (!) 35  Temp: 98.6 F (37 C)  99.1 F (37.3 C)   TempSrc: Axillary  Axillary   SpO2: 98% 99%  99%  Weight:      Height:        Intake/Output Summary (Last 24 hours) at 01/07/2020 0851 Last data filed at 01/07/2020 0800 Gross per 24 hour  Intake 1485.14 ml  Output 1125 ml  Net 360.14 ml   Filed Weights   01/05/20 0500 01/06/20 0500 01/07/20 0441  Weight: 69.5 kg 68.9 kg 67.3 kg    Examination:  General exam: very weak, frail, open eyes but not interactive Respiratory system: diminished on the right.poor Respiratory effort , tachypnea Cardiovascular system:tachycardia Gastrointestinal system: Abdomen is nondistended, soft and nontender.  Normal bowel sounds heard. Central nervous system: open eyes but not interactive Extremities: generalized weakness,  Skin: No rashes, lesions or ulcers Psychiatry: not interactive, no agitation    Data Reviewed: I have personally reviewed following labs and imaging studies  CBC: Recent Labs  Lab 12/31/2019 1330 01/05/20 0025 01/06/20 1137 01/07/20 0246  WBC 7.7 4.6 9.9 15.3*  NEUTROABS 6.2 3.3 7.8* 11.8*  HGB 10.0* 8.6* 8.9* 9.0*  HCT 32.8* 27.7* 28.1* 28.5*  MCV 98.5 97.2 96.6 96.3  PLT 106* 82* 77* 66*    Basic Metabolic Panel: Recent Labs  Lab 01/24/2020 1330 01/05/20 0025 01/06/20 1137 01/07/20 0246  NA 143 141 148* 147*  K 4.8 4.0 3.0* 4.1  CL 109 110 114* 117*  CO2 20* 16* 18* 18*  GLUCOSE 131* 139* 133* 187*  BUN 51* 49* 46* 45*  CREATININE 1.11 1.03 1.07 1.07  CALCIUM 8.9 7.8* 8.1* 8.3*  MG  --   --  1.8 1.9    GFR: Estimated Creatinine Clearance: 59.4 mL/min (by C-G formula based on SCr of 1.07  mg/dL).  Liver Function Tests: Recent Labs  Lab 01/03/2020 1330 01/06/20 1137 01/07/20 0246  AST 62* 38 33  ALT 62* 45* 37  ALKPHOS 212* 136* 145*  BILITOT 1.3* 1.9* 1.7*  PROT 6.9 5.9* 5.8*  ALBUMIN 2.3* 2.5* 2.4*    CBG: No results for input(s): GLUCAP in the last 168 hours.   Recent Results (from the past 240 hour(s))  Blood culture (routine x 2)     Status: None (Preliminary result)   Collection Time: 01/05/2020  1:29 PM   Specimen: BLOOD  Result Value Ref Range Status   Specimen Description   Final    BLOOD RIGHT ANTECUBITAL Performed at [facility] [address] [place], [place] [address]    Special Requests   Final    BOTTLES DRAWN AEROBIC AND ANAEROBIC Blood Culture adequate volume Performed at [facility] [address], [place], [place] [address]    Culture   Final    NO GROWTH 3 DAYS Performed at [facility], [place] [address] [place], [place] [address]  Report Status PENDING  Incomplete  Blood culture (routine x 2)     Status: None (Preliminary result)   Collection Time: 01/07/2020  1:30 PM   Specimen: BLOOD RIGHT HAND  Result Value Ref Range Status   Specimen Description   Final    BLOOD RIGHT HAND Performed at [facility] [address] [place], [place] [address]    Special Requests   Final    BOTTLES DRAWN AEROBIC AND ANAEROBIC Blood Culture results may not be optimal due to an inadequate volume of blood received in culture bottles Performed at [facility] [address], [place], [place] [address]    Culture   Final    NO GROWTH 3 DAYS Performed at [facility], [place] [address], [place], [place] [address]    Report Status PENDING  Incomplete  Respiratory Panel by RT PCR (Flu A&B, Covid) - Nasopharyngeal Swab     Status: None   Collection Time: 01/03/2020  2:11 PM   Specimen: Nasopharyngeal Swab  Result Value Ref Range Status   SARS Coronavirus 2 by RT  PCR NEGATIVE NEGATIVE Final    Comment: (NOTE) SARS-CoV-2 target nucleic acids are NOT DETECTED.  The SARS-CoV-2 RNA is generally detectable in upper respiratoy specimens during the acute phase of infection. The lowest concentration of SARS-CoV-2 viral copies this assay can detect is 131 copies/mL. A negative result does not preclude SARS-Cov-2 infection and should not be used as the sole basis for treatment or other patient management decisions. A negative result may occur with  improper specimen collection/handling, submission of specimen other than nasopharyngeal swab, presence of viral mutation(s) within the areas targeted by this assay, and inadequate number of viral copies (<131 copies/mL). A negative result must be combined with clinical observations, patient history, and epidemiological information. The expected result is Negative.  Fact Sheet for Patients:  PinkCheek.be  Fact Sheet for Healthcare Providers:  GravelBags.it  This test is no t yet approved or cleared by the [place] FDA and  has been authorized for detection and/or diagnosis of SARS-CoV-2 by FDA under an Emergency Use Authorization (EUA). This EUA will remain  in effect (meaning this test can be used) for the duration of the COVID-19 declaration under Section 564(b)(1) of the Act, 21 U.S.C. section 360bbb-3(b)(1), unless the authorization is terminated or revoked sooner.     Influenza A by PCR NEGATIVE NEGATIVE Final   Influenza B by PCR NEGATIVE NEGATIVE Final    Comment: (NOTE) The Xpert Xpress SARS-CoV-2/FLU/RSV assay is intended as an aid in  the diagnosis of influenza from Nasopharyngeal swab specimens and  should not be used as a sole basis for treatment. Nasal washings and  aspirates are unacceptable for Xpert Xpress SARS-CoV-2/FLU/RSV  testing.  Fact Sheet for Patients: PinkCheek.be  Fact Sheet for  Healthcare Providers: GravelBags.it  This test is not yet approved or cleared by the [place] FDA and  has been authorized for detection and/or diagnosis of SARS-CoV-2 by  FDA under an Emergency Use Authorization (EUA). This EUA will remain  in effect (meaning this test can be used) for the duration of the  Covid-19 declaration under Section 564(b)(1) of the Act, 21  U.S.C. section 360bbb-3(b)(1), unless the authorization is  terminated or revoked. Performed at [facility], [place] [address], [place], [place] [address]   Urine culture     Status: None   Collection Time: 12/27/2019  2:54 PM   Specimen: In/Out Cath Urine  Result Value Ref Range Status   Specimen Description   Final    IN/OUT CATH URINE Performed at [facility] [address], [place], [place] [address]    Special Requests   Final    NONE Performed at [facility], [place] [address], [place], [place] [address]    Culture   Final    NO GROWTH Performed at [facility], [place] [address], [place], [place] [address]    Report Status 01/06/2020 FINAL  Final  Body fluid culture (includes gram stain)     Status: None (Preliminary result)   Collection Time: 01/16/2020  7:17 PM   Specimen: Pleural Fluid  Result Value Ref Range Status   Specimen Description   Final    PLEURAL Performed at [facility] [address] [place], [place] [address]    Special Requests   Final    NONE Performed at [facility], [place] [address], [place], [place] [address]    Gram Stain   Final    FEW WBC PRESENT,BOTH PMN AND MONONUCLEAR NO ORGANISMS SEEN    Culture   Final    NO GROWTH 2 DAYS Performed at [facility], [place] [address], [place], [place] [address]    Report Status PENDING  Incomplete  MRSA PCR Screening     Status: None   Collection Time: 01/25/2020  8:51 PM   Specimen: Nasopharyngeal  Result  Value Ref Range Status   MRSA by PCR NEGATIVE NEGATIVE Final    Comment:        The GeneXpert MRSA Assay (FDA approved for NASAL specimens only), is one component of a comprehensive MRSA colonization surveillance program. It is not intended to diagnose MRSA infection nor to guide or monitor treatment for MRSA infections. Performed at [facility], [place] [address] [place], [place] [address]   C Difficile Quick Screen w PCR reflex     Status: None   Collection Time: 01/06/20  5:45 PM   Specimen: STOOL  Result Value Ref Range Status   C Diff antigen NEGATIVE NEGATIVE Final   C Diff toxin NEGATIVE NEGATIVE Final   C Diff interpretation No C. difficile detected.  Final    Comment: Performed at [facility], [place] [address] [place], [place] [address]         Radiology Studies: DG CHEST PORT 1 VIEW  Result Date: 01/06/2020 CLINICAL DATA:  Right thoracentesis EXAM: PORTABLE CHEST 1 VIEW COMPARISON:  01/02/2020 FINDINGS: Single frontal view of the chest demonstrates decreased right pleural effusion after thoracentesis. No evidence of pneumothorax. Persistent consolidation at the right lung base. Left chest is clear. IMPRESSION: 1. No complication after right thoracentesis. Decreased right pleural effusion. 2. Persistent right basilar consolidation consistent with known lung cancer. Electronically Signed   By: Randa Ngo M.D.   On: 01/06/2020 17:56        Scheduled Meds: . chlorhexidine  15 mL Mouth Rinse BID  . Chlorhexidine Gluconate Cloth  6 each Topical Daily  . enoxaparin (LOVENOX) injection  1 mg/kg Subcutaneous Q12H  . lip balm   Topical BID  . mouth rinse  15 mL Mouth Rinse q12n4p   Continuous Infusions: . ceFEPime (MAXIPIME) IV Stopped (01/07/20 0235)  . dextrose 5% lactated ringers with KCl 20 mEq/L 75 mL/hr at 01/07/20 0800  . diltiazem (CARDIZEM) infusion 5 mg/hr (01/07/20 0800)     LOS: 3 days   Time spent:  64mins Greater than 50% of this time was spent in counseling, explanation of diagnosis, planning of further management, and coordination of care.  I have personally reviewed and interpreted on  01/07/2020 daily labs, tele strips, I reviewed all nursing notes, pharmacy notes, consultant notes,  vitals, pertinent old records  I have discussed plan of care as described above with RN , wife on 01/07/2020  Voice Recognition /Dragon dictation system was used to create this note, attempts have been made to correct errors. Please contact the author with questions and/or clarifications.   Florencia Reasons, MD PhD FACP [facility]  Available via Epic secure chat 7am-7pm for nonurgent issues Please page for urgent issues To page the attending provider between 7A-7P or the covering provider during after hours 7P-7A, please log into the web site [email or web address] and access using universal [place] password for that web site. If you do not have the password, please call the hospital operator.    01/07/2020, 8:51 AM

## 2020-01-07 NOTE — Progress Notes (Signed)
SLP Cancellation Note  Patient Details Name: Jerry Wong MRN: [id number] DOB: 1945-03-31   Cancelled treatment:        RN stated pt will open eyes but not able to effectively follow commands. MD noted significant change from yesterday, less responsive. Per RN, MD wants NPO status. Currently Dys 3, nectar still ordered but not giving per RN. Will follow up for plans for treatment versus comfort care.    Houston Siren 01/07/2020, 5:00 PM Orbie Pyo Colvin Caroli.Ed Risk analyst [phone] Office [phone]

## 2020-01-07 NOTE — Progress Notes (Signed)
Right lower extremity venous duplex has been completed. Preliminary results can be found in CV Proc through chart review.   01/07/20 8:57 AM Carlos Levering RVT

## 2020-01-07 NOTE — Progress Notes (Signed)
PCCM Sign Off Note    CXR post thora 11/11 with complete evacuation of effusion.  Follow pathology from 11/9, 11/11 for determination if effusion is malignant.  If pleural fluid is positive for malignant cells, patient would possibly need palliative pleurX placement depending on rate of re-accumulation.  Recommend palliative care discussion with family for goals of care. PCCM will be available PRN. Please call back if new needs arise.    [name], MSN, NP-C, AGACNP-BC [facility] 01/07/2020, 8:49 AM   Please see [email or web address] for pager details.

## 2020-01-08 DIAGNOSIS — R531 Weakness: Secondary | ICD-10-CM

## 2020-01-08 DIAGNOSIS — Z515 Encounter for palliative care: Secondary | ICD-10-CM | POA: Diagnosis not present

## 2020-01-08 DIAGNOSIS — E440 Moderate protein-calorie malnutrition: Secondary | ICD-10-CM | POA: Diagnosis not present

## 2020-01-08 DIAGNOSIS — Z7189 Other specified counseling: Secondary | ICD-10-CM | POA: Diagnosis not present

## 2020-01-08 LAB — BODY FLUID CULTURE: Culture: NO GROWTH

## 2020-01-08 NOTE — Progress Notes (Addendum)
PROGRESS NOTE    Jerry Wong  PJK:[id number] DOB: 29-Apr-1945 DOA: 01/17/2020 PCP: Pleas Koch, NP    Chief Complaint  Patient presents with  . 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 Progressed on first line therapy, received second line therapy on   Subjective:  He had increased discomfort yesterday, care transitioned to full comfort He is not awake, open his mouth, he does not appear in any distress, currently on 4mg  /hr morphine infusion  Assessment & Plan:   Active Problems:   Sepsis ([place])   Pressure injury of skin   Malnutrition of moderate degree   Pleural effusion  Acute hypoxic respiratory failure/pneumonia/sepsis on presentation -With stage IV non-small cell lung cancer, large pleural effusion status post thoracentesis about a liter initially on 11/9, another bedside thoracentesis on 11/11 with 1400 cc's of clear yellow fluid was drained from the right pleural space -Sepsis secondary to pneumonia, Vanco discontinued, he is continued on cefepime, lactic acid normalized  -initially required bipap, appreciate critical care input, both critical care and oncology recommend palliative care consult , consult order placed yesterday  -patient had  a good day on 11/11 , was interactive and following commands, but deteriorated on 11/13, he is less responsive, tachypnea, am labs got worse, clinically suspect another aspiration event lead to deterioration, has long conversation with wife at bedside in the setting of stage IV cancer not responding to chemo,  he can not tolerate chemo, he is too weak to take oral safely without risking aspiration, clinically he likely suffered another aspiration event while on abx , care transitioned to full comfort measures on Jan 24, 2023  pm -in hospital death vs residential hospice Palliative care consulted.   Acute metabolic encephalopathy Ct head on 11/9 on acute findings He is oriented to person, year, not to the months, and place, calm and cooperative on 11/11,clinically deteriorated , now full comfort measures   A. 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 progressed on first line treatment Second line systemic chemotherapy with docetaxel 60 mg/M2 and Cyramza 10 mg/KG every 3 weeks with Neulasta support.  First dose December 28, 2019. Oncology and palliative care consulted for cancer care and goals of care discussion shift to full comfort measures  Anemia and thrombocytopenia in the setting of malignancy -He received a blood transfusion prior to recent chemotherapy -No blood transfusion needed during this hospitalization -shift to full comfort measures  FTT/generalized deconditioning, malnutrition Body mass index is 21.91 kg/m.  Needs goals of care discussion, oncology Dr Julien Nordmann agrees.    Pm addendum: 5:54pm, patient started to have increased discomfort and continued decreased mentation, I have discussed with wife , now transition to full comfort  Measures     DVT prophylaxis: SCDs Start: 01/02/2020 1842   Code Status:DNR Family Communication:  wife updated over the phone Disposition:   Status is: Inpatient  Dispo: The patient is from: home  Anticipated d/c is to: inpatient death vs residential  hospice                               Consultants:   Critical care  Oncology  Palliative care  Procedures:    thoracentesis  Antimicrobials:   As above     Objective: Vitals:   01/08/20 0009 01/08/20 0100 01/08/20 0400 01/08/20 0600  BP:      Pulse:  100 88 (!) 103  Resp:  18 20 15   Temp: 98.2 F (36.8 C)     TempSrc: Axillary     SpO2:  (!) 87% 91% 98%  Weight:      Height:        Intake/Output Summary (Last 24 hours) at 01/08/2020 0810 Last data filed at 01/08/2020 0410 Gross per 24 hour  Intake 903.66 ml  Output 100 ml  Net 803.66 ml   Filed Weights   01/05/20 0500 01/06/20 0500 01/07/20 0441  Weight: 69.5 kg 68.9 kg 67.3 kg    Examination:  General exam: no awake, no morphine drip, no distress noted Respiratory system: poor Respiratory effort     Data Reviewed: I have personally reviewed following labs and imaging studies  CBC: Recent Labs  Lab 12/31/2019 1330 01/05/20 0025 01/06/20 1137 01/07/20 0246  WBC 7.7 4.6 9.9 15.3*  NEUTROABS 6.2 3.3 7.8* 11.8*  HGB 10.0* 8.6* 8.9* 9.0*  HCT 32.8* 27.7* 28.1* 28.5*  MCV 98.5 97.2 96.6 96.3  PLT 106* 82* 77* 66*    Basic Metabolic Panel: Recent Labs  Lab 01/24/2020 1330 01/05/20 0025 01/06/20 1137 01/07/20 0246  NA 143 141 148* 147*  K 4.8 4.0 3.0* 4.1  CL 109 110 114* 117*  CO2 20* 16* 18* 18*  GLUCOSE 131* 139* 133* 187*  BUN 51* 49* 46* 45*  CREATININE 1.11 1.03 1.07 1.07  CALCIUM 8.9 7.8* 8.1* 8.3*  MG  --   --  1.8 1.9    GFR: Estimated Creatinine Clearance: 59.4 mL/min (by C-G formula based on SCr of 1.07 mg/dL).  Liver Function Tests: Recent Labs  Lab 01/05/2020 1330 01/06/20 1137 01/07/20 0246  AST 62* 38 33  ALT 62* 45* 37  ALKPHOS 212* 136* 145*  BILITOT 1.3* 1.9* 1.7*  PROT 6.9 5.9* 5.8*  ALBUMIN 2.3* 2.5* 2.4*    CBG: No results for input(s): GLUCAP in the last 168 hours.   Recent Results (from the past 240 hour(s))  Blood culture (routine x 2)     Status: None (Preliminary result)   Collection Time: 01/01/2020  1:29 PM   Specimen: BLOOD  Result Value Ref Range Status    Specimen Description   Final    BLOOD RIGHT ANTECUBITAL Performed at [facility] [address] [place], [place] [address]    Special Requests   Final    BOTTLES DRAWN AEROBIC AND ANAEROBIC Blood Culture adequate volume Performed at [facility] [address] [place], [place] [address]    Culture   Final    NO GROWTH 3 DAYS Performed at [facility], [place] [address] [place],  [address]    Report Status PENDING  Incomplete  Blood culture (routine x 2)     Status: None (Preliminary result)   Collection Time: 01/17/2020  1:30 PM   Specimen: BLOOD RIGHT HAND  Result Value Ref Range Status   Specimen Description   Final  BLOOD RIGHT HAND Performed at [facility], [place] [address] [place], [place] [address]    Special Requests   Final    BOTTLES DRAWN AEROBIC AND ANAEROBIC Blood Culture results may not be optimal due to an inadequate volume of blood received in culture bottles Performed at [facility] [address], [place], [place] [address]    Culture   Final    NO GROWTH 3 DAYS Performed at [facility], [place] [address], [place], [place] [address]    Report Status PENDING  Incomplete  Respiratory Panel by RT PCR (Flu A&B, Covid) - Nasopharyngeal Swab     Status: None   Collection Time: 01/07/2020  2:11 PM   Specimen: Nasopharyngeal Swab  Result Value Ref Range Status   SARS Coronavirus 2 by RT PCR NEGATIVE NEGATIVE Final    Comment: (NOTE) SARS-CoV-2 target nucleic acids are NOT DETECTED.  The SARS-CoV-2 RNA is generally detectable in upper respiratoy specimens during the acute phase of infection. The lowest concentration of SARS-CoV-2 viral copies this assay can detect is 131 copies/mL. A negative result does not preclude SARS-Cov-2 infection and should not be used as the sole basis for treatment or other patient management decisions. A negative result may occur with    improper specimen collection/handling, submission of specimen other than nasopharyngeal swab, presence of viral mutation(s) within the areas targeted by this assay, and inadequate number of viral copies (<131 copies/mL). A negative result must be combined with clinical observations, patient history, and epidemiological information. The expected result is Negative.  Fact Sheet for Patients:  PinkCheek.be  Fact Sheet for Healthcare Providers:  GravelBags.it  This test is no t yet approved or cleared by the [place] FDA and  has been authorized for detection and/or diagnosis of SARS-CoV-2 by FDA under an Emergency Use Authorization (EUA). This EUA will remain  in effect (meaning this test can be used) for the duration of the COVID-19 declaration under Section 564(b)(1) of the Act, 21 U.S.C. section 360bbb-3(b)(1), unless the authorization is terminated or revoked sooner.     Influenza A by PCR NEGATIVE NEGATIVE Final   Influenza B by PCR NEGATIVE NEGATIVE Final    Comment: (NOTE) The Xpert Xpress SARS-CoV-2/FLU/RSV assay is intended as an aid in  the diagnosis of influenza from Nasopharyngeal swab specimens and  should not be used as a sole basis for treatment. Nasal washings and  aspirates are unacceptable for Xpert Xpress SARS-CoV-2/FLU/RSV  testing.  Fact Sheet for Patients: PinkCheek.be  Fact Sheet for Healthcare Providers: GravelBags.it  This test is not yet approved or cleared by the [place] FDA and  has been authorized for detection and/or diagnosis of SARS-CoV-2 by  FDA under an Emergency Use Authorization (EUA). This EUA will remain  in effect (meaning this test can be used) for the duration of the  Covid-19 declaration under Section 564(b)(1) of the Act, 21  U.S.C. section 360bbb-3(b)(1), unless the authorization is  terminated or  revoked. Performed at [facility], [place] [address] [place], [place] [address]   Urine culture     Status: None   Collection Time: 01/05/2020  2:54 PM   Specimen: In/Out Cath Urine  Result Value Ref Range Status   Specimen Description   Final    IN/OUT CATH URINE Performed at [facility] [address], [place], [place] [address]    Special Requests   Final    NONE Performed at [facility], [place] [address] [place], [place] [address]    Culture   Final    NO GROWTH Performed at [facility], [place] [address], [place], [place] [address]    Report Status 01/06/2020 FINAL  Final  Body fluid culture (includes gram stain)     Status: None (Preliminary result)   Collection Time: 12/27/2019  7:17 PM   Specimen: Pleural Fluid  Result Value Ref Range Status   Specimen Description   Final    PLEURAL Performed at [facility] [address] [place], [place] [address]    Special Requests   Final    NONE Performed at [facility], [place] [address], [place], [place] [address]    Gram Stain   Final    FEW WBC PRESENT,BOTH PMN AND MONONUCLEAR NO ORGANISMS SEEN    Culture   Final    NO GROWTH 3 DAYS Performed at [facility], [place] [address], [place], [place] [address]    Report Status PENDING  Incomplete  MRSA PCR Screening     Status: None   Collection Time: 01/23/2020  8:51 PM   Specimen: Nasopharyngeal  Result Value Ref Range Status   MRSA by PCR NEGATIVE NEGATIVE Final    Comment:        The GeneXpert MRSA Assay (FDA approved for NASAL specimens only), is one component of a comprehensive MRSA colonization surveillance program. It is not intended to diagnose MRSA infection nor to guide or monitor treatment for MRSA infections. Performed at [facility], [place] [address], [place], [place] [address]   C Difficile Quick Screen w PCR reflex     Status: None    Collection Time: 01/06/20  5:45 PM   Specimen: STOOL  Result Value Ref Range Status   C Diff antigen NEGATIVE NEGATIVE Final   C Diff toxin NEGATIVE NEGATIVE Final   C Diff interpretation No C. difficile detected.  Final    Comment: Performed at [facility], [place] [address] [place], [place] [address]         Radiology Studies: DG CHEST PORT 1 VIEW  Result Date: 01/06/2020 CLINICAL DATA:  Right thoracentesis EXAM: PORTABLE CHEST 1 VIEW COMPARISON:  01/01/2020 FINDINGS: Single frontal view of the chest demonstrates decreased right pleural effusion after thoracentesis. No evidence of pneumothorax. Persistent consolidation at the right lung base. Left chest is clear. IMPRESSION: 1. No complication after right thoracentesis. Decreased right pleural effusion. 2. Persistent right basilar consolidation consistent with known lung cancer. Electronically Signed   By: Randa Ngo M.D.   On: 01/06/2020 17:56   VAS [place] LOWER EXTREMITY VENOUS (DVT)  Result Date: 01/07/2020  Lower Venous DVT Study Indications: Edema.  Risk Factors: Cancer. Limitations: Poor ultrasound/tissue interface. Comparison Study: No prior studies. Performing Technologist: Oliver Hum RVT  Examination Guidelines: A complete evaluation includes B-mode imaging, spectral Doppler, color Doppler, and power Doppler as needed of all accessible portions of each vessel. Bilateral testing is considered an integral part of a complete examination. Limited examinations for reoccurring indications may be performed as noted. The reflux portion of the exam is performed with the patient in reverse Trendelenburg.  +---------+---------------+---------+-----------+----------+-------------------+ RIGHT    CompressibilityPhasicitySpontaneityPropertiesThrombus Aging      +---------+---------------+---------+-----------+----------+-------------------+ CFV      Full           Yes      Yes                                       +---------+---------------+---------+-----------+----------+-------------------+  SFJ      Full                                                             +---------+---------------+---------+-----------+----------+-------------------+ FV Prox  Full                                                             +---------+---------------+---------+-----------+----------+-------------------+ FV Mid   Full                                                             +---------+---------------+---------+-----------+----------+-------------------+ FV DistalFull                                                             +---------+---------------+---------+-----------+----------+-------------------+ PFV      Full                                                             +---------+---------------+---------+-----------+----------+-------------------+ POP      Full           Yes      Yes                                      +---------+---------------+---------+-----------+----------+-------------------+ PTV      Full                                                             +---------+---------------+---------+-----------+----------+-------------------+ PERO                                                  Not well visualized +---------+---------------+---------+-----------+----------+-------------------+   +----+---------------+---------+-----------+----------+--------------+ LEFTCompressibilityPhasicitySpontaneityPropertiesThrombus Aging +----+---------------+---------+-----------+----------+--------------+ CFV Full           Yes      Yes                                 +----+---------------+---------+-----------+----------+--------------+     Summary: RIGHT: - There is no evidence of deep vein thrombosis in the lower extremity. However, portions of this examination were limited- see technologist  comments above.  - No cystic structure found in the  popliteal fossa.  LEFT: - No evidence of common femoral vein obstruction.  *See table(s) above for measurements and observations.    Preliminary         Scheduled Meds: . chlorhexidine  15 mL Mouth Rinse BID  . Chlorhexidine Gluconate Cloth  6 each Topical Daily  . lip balm   Topical BID  . mouth rinse  15 mL Mouth Rinse q12n4p   Continuous Infusions: . chlorproMAZINE (THORAZINE) IV    . morphine 4 mg/hr (01/08/20 0410)     LOS: 4 days   Time spent: 30mins Greater than 50% of this time was spent in counseling, explanation of diagnosis, planning of further management, and coordination of care.  I have discussed plan of care as described above with RN  on 01/08/2020  Voice Recognition /Dragon dictation system was used to create this note, attempts have been made to correct errors. Please contact the author with questions and/or clarifications.   Florencia Reasons, MD PhD FACP [facility]  Available via Epic secure chat 7am-7pm for nonurgent issues Please page for urgent issues To page the attending provider between 7A-7P or the covering provider during after hours 7P-7A, please log into the web site [email or web address] and access using universal [place] password for that web site. If you do not have the password, please call the hospital operator.    01/08/2020, 8:10 AM

## 2020-01-08 NOTE — Consult Note (Signed)
Consultation Note Date: 01/08/2020   Patient Name: Jerry Wong  DOB: 1945-08-28  MRN: [id number]  Age / Sex: 74 y.o., male  PCP: Pleas Koch, NP Referring Physician: Florencia Reasons, MD  Reason for Consultation: Terminal Care  HPI/Patient Profile: 74 y.o. male admitted on 01/18/2020    Clinical Assessment and Goals of Care: 74 year old gentleman with a life limiting history of non-small cell lung cancer presented with large right hilar mass, mediastinal adenopathy, bilateral adrenal metastases and bony metastatic disease burden. Patient is admitted to hospital medicine service for acute hypoxic respiratory failure pneumonia and sepsis on presentation and acute metabolic encephalopathy atrial fibrillation. Hospital course complicated by the patient having ongoing decline, possibly suffered another aspiration event with increased symptom burden, patient was transition to full comfort measures on 01-07-20.  Palliative consult for ongoing terminal care has been requested.  Patient was started on continuous opioid infusion for comfort measures.  He is resting in bed.  He has coarse breath sounds and is not awake, not alert.  Currently no family at the bedside but they have been supported by [facility] MD as well as ICU staff.  Will also request chaplain support and provision of comfort cart for family when they arrive.  Patient could be transferred out of the ICU to a nonmonitored bed for continuation of comfort measures.  Prognosis appears to be as short as few hours to as long as some very limited number of days.  Anticipated hospital death.  At this time, would not recommend transportation to residential hospice facility.  Continue full scope of comfort measures here.  Palliative medicine is specialized medical care for people living with serious illness. It focuses on providing relief from the symptoms and stress of a serious  illness. The goal is to improve quality of life for both the patient and the family.  Goals of care: Broad aims of medical therapy in relation to the patient's values and preferences. Our aim is to provide medical care aimed at enabling patients to achieve the goals that matter most to them, given the circumstances of their particular medical situation and their constraints.    NEXT OF KIN Spouse.  Patient is from [place] well, [place] with DO NOT RESUSCITATE Agree with full scope of comfort measures Agree with current dose of morphine infusion.  Also has provision for bolus dosages off of the current morphine infusion which is at 4 mg/h  Code Status/Advance Care Planning:  DNR    Symptom Management:   As above  Palliative Prophylaxis:   Delirium Protocol  Additional Recommendations (Limitations, Scope, Preferences):  Full Comfort Care  Psycho-social/Spiritual:   Desire for further Chaplaincy support:yes  Additional Recommendations: Caregiving  Support/Resources  Prognosis:   Hours - Days  Discharge Planning: Anticipated Hospital Death      Primary Diagnoses: Present on Admission: . Sepsis ([place])   I have reviewed the medical record, interviewed the patient and family, and examined the patient. The following aspects are pertinent.  Past Medical History:  Diagnosis Date  . Arthritis   . Cataract   . Chickenpox   . Chronic headaches   . Chronic kidney disease   . Chronic neck and back pain   . History of kidney stones   . Hyperlipidemia   . Hypertension   . Insomnia   . Lung cancer ([place]) dx'd 08/2019  . Neuromuscular disorder ([place])   . Prostate cancer [facility])    Social History   Socioeconomic History  . Marital status: Married    Spouse name: [name]  . Number of children: 3  . Years of education: 53  . Highest education level: Not on file  Occupational History  . Not on file  Tobacco Use  . Smoking  status: Former Smoker    Packs/day: 0.30    Years: 11.00    Pack years: 3.30  . Smokeless tobacco: Never Used  . Tobacco comment: quit 35-40 years ago per pt  Vaping Use  . Vaping Use: Never used  Substance and Sexual Activity  . Alcohol use: Not Currently  . Drug use: No  . Sexual activity: Not Currently  Other Topics Concern  . Not on file  Social History Narrative   Patient is married [name]) and lives at home with his wife and son.   Patient has three children.   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   Patient has a high school education.   Patient is right-handed.   Patient drinks one cup of coffee every morning.   Social Determinants of Health   Financial Resource Strain:   . Difficulty of Paying Living Expenses: Not on file  Food Insecurity:   . Worried About Charity fundraiser in the Last Year: Not on file  . Ran Out of Food in the Last Year: Not on file  Transportation Needs:   . Lack of Transportation (Medical): Not on file  . Lack of Transportation (Non-Medical): Not on file  Physical Activity:   . Days of Exercise per Week: Not on file  . Minutes of Exercise per Session: Not on file  Stress:   . Feeling of Stress : Not on file  Social Connections:   . Frequency of Communication with Friends and Family: Not on file  . Frequency of Social Gatherings with Friends and Family: Not on file  . Attends Religious Services: Not on file  . Active Member of Clubs or Organizations: Not on file  . Attends Archivist Meetings: Not on file  . Marital Status: Not on file   Family History  Problem Relation Age of Onset  . Arthritis Mother   . Lung cancer Mother   . Arthritis Father   . Lung cancer Father   . Hyperlipidemia Father   . Hypertension Father   . Stroke Father   . Prostate cancer Brother   . Stomach cancer Maternal Grandmother   . Colon cancer Neg Hx   . Esophageal cancer Neg Hx   . Pancreatic cancer Neg Hx   . Rectal cancer Neg  Hx    Scheduled Meds: . chlorhexidine  15 mL Mouth Rinse BID  . Chlorhexidine Gluconate Cloth  6 each Topical Daily  . lip balm   Topical BID  . mouth rinse  15 mL Mouth Rinse q12n4p   Continuous Infusions: . chlorproMAZINE (THORAZINE) IV    . morphine 4 mg/hr (01/08/20 0410)   PRN Meds:.acetaminophen **OR** acetaminophen, albuterol, chlorproMAZINE (THORAZINE) IV, docusate sodium, glycopyrrolate **OR**  glycopyrrolate **OR** glycopyrrolate, haloperidol **OR** haloperidol **OR** haloperidol lactate, ipratropium-albuterol, LORazepam **OR** LORazepam **OR** LORazepam, LORazepam, morphine injection, ondansetron **OR** ondansetron (ZOFRAN) IV, polyethylene glycol, polyvinyl alcohol Medications Prior to Admission:  Prior to Admission medications   Medication Sig Start Date End Date Taking? Authorizing Provider  albuterol (VENTOLIN HFA) 108 (90 Base) MCG/ACT inhaler Inhale 2 puffs into the lungs every 4 (four) hours as needed for wheezing or shortness of breath. 08/25/19  Yes Rodriguez-Southworth, Sunday Spillers, PA-C  amitriptyline (ELAVIL) 75 MG tablet Take 1 tablet (75 mg total) by mouth at bedtime. 10/26/18  Yes Dohmeier, Asencion Partridge, MD  amLODipine (NORVASC) 10 MG tablet TAKE 1 TABLET BY MOUTH EVERY DAY Patient taking differently: Take 10 mg by mouth daily.  12/31/19  Yes Pleas Koch, NP  atorvastatin (LIPITOR) 20 MG tablet Take 1 tablet (20 mg total) by mouth daily. For cholesterol. Patient taking differently: Take 20 mg by mouth daily.  11/19/19  Yes Pleas Koch, NP  dexamethasone (DECADRON) 4 MG tablet 2 tablet p.o. twice daily the day before, day of and day after chemotherapy every 3 weeks. Patient taking differently: Take 8 mg by mouth 2 (two) times daily. 2 tablet by mouth twice daily the day before, day of and day after chemotherapy every 3 weeks. 12/21/19  Yes Curt Bears, MD  folic acid (FOLVITE) 1 MG tablet Take 1 tablet (1 mg total) by mouth daily. 10/04/19  Yes Curt Bears, MD    ibuprofen (ADVIL) 200 MG tablet Take 400 mg by mouth every 6 (six) hours as needed for fever.   Yes [provider]  LYRICA 100 MG capsule Take 100 mg by mouth 3 (three) times daily.  10/10/16  Yes [provider]  mirtazapine (REMERON) 30 MG tablet Take 30 mg by mouth at bedtime. 10/26/19  Yes [provider]  Omega-3 Fatty Acids ([employer] FISH OIL) 1000 MG CAPS TAKE 1 CAPSULE BY MOUTH EVERY DAY WITH A MEAL Patient taking differently: Take 1,000 mg by mouth daily.  03/06/18  Yes Pleas Koch, NP  oxyCODONE-acetaminophen (PERCOCET) 10-325 MG per tablet Take 1 tablet by mouth every 6 (six) hours as needed for pain.  07/29/12  Yes [provider]  prochlorperazine (COMPAZINE) 10 MG tablet Take 1 tablet (10 mg total) by mouth every 6 (six) hours as needed for nausea or vomiting. 10/19/19  Yes Heilingoetter, Cassandra L, PA-C  tiZANidine (ZANAFLEX) 4 MG tablet TAKE 1 TABLET BY MOUTH TWICE A DAY Patient taking differently: Take 4 mg by mouth 2 (two) times daily.  10/26/18  Yes Dohmeier, Asencion Partridge, MD  zolpidem (AMBIEN) 10 MG tablet Take 10 mg by mouth at bedtime as needed for sleep.  11/10/19  Yes [provider]  methylPREDNISolone (MEDROL DOSEPAK) 4 MG TBPK tablet Use as instructed. Patient not taking: Reported on 12/27/2019 12/21/19   Curt Bears, MD   No Known Allergies Review of Systems Unresponsive Physical Exam Unresponsive, actively dying Generalized edema worse in upper extremities than lower extremities Extremities still warm to touch no coolness no mottling Coarse breath sounds anterior lung fields S1-S2 Abdomen not distended Vital Signs: BP 111/64   Pulse 95   Temp 98.2 F (36.8 C) (Axillary)   Resp 15   Ht 5\' 9"  (1.753 m)   Wt 67.3 kg   SpO2 (!) 89%   BMI 21.91 kg/m  Pain Scale: PAINAD   Pain Score: 0-No pain   SpO2: SpO2: (!) 89 % O2 Device:SpO2: (!) 89 % O2 Flow Rate: .O2 Flow  Rate (L/min): 4 L/min  IO: Intake/output  summary:   Intake/Output Summary (Last 24 hours) at 01/08/2020 0926 Last data filed at 01/08/2020 0410 Gross per 24 hour  Intake 823.61 ml  Output 100 ml  Net 723.61 ml    LBM: Last BM Date: 01/07/20 Baseline Weight: Weight: 68 kg Most recent weight: Weight:  (not done, patient comfort care)     Palliative Assessment/Data:   PPS 10%%  Time In:  8 Time Out:9   Time Total60 :   Greater than 50%  of this time was spent counseling and coordinating care related to the above assessment and plan.  Signed by: Loistine Chance, MD   Please contact Palliative Medicine Team phone at [phone] for questions and concerns.  For individual provider: See Shea Evans

## 2020-01-09 DIAGNOSIS — Z7189 Other specified counseling: Secondary | ICD-10-CM

## 2020-01-09 DIAGNOSIS — E440 Moderate protein-calorie malnutrition: Secondary | ICD-10-CM | POA: Diagnosis not present

## 2020-01-09 DIAGNOSIS — R52 Pain, unspecified: Secondary | ICD-10-CM | POA: Diagnosis not present

## 2020-01-09 DIAGNOSIS — Z515 Encounter for palliative care: Secondary | ICD-10-CM | POA: Diagnosis not present

## 2020-01-09 LAB — CULTURE, BLOOD (ROUTINE X 2)
Culture: NO GROWTH
Culture: NO GROWTH
Special Requests: ADEQUATE

## 2020-01-09 NOTE — Progress Notes (Signed)
Daily Progress Note   Patient Name: Jerry Wong       Date: 01/09/2020 DOB: [birth year]  Age: 74 y.o. MRN#: [id number] Attending Physician: Florencia Reasons, MD Primary Care Physician: Pleas Koch, NP Admit Date: 01/07/2020  Reason for Consultation/Follow-up: Terminal Care  Subjective:actively dying, appears comfortable, no non verbal gestures of distress or discomfort.   Length of Stay: 5  Current Medications: Scheduled Meds:  . chlorhexidine  15 mL Mouth Rinse BID  . Chlorhexidine Gluconate Cloth  6 each Topical Daily  . lip balm   Topical BID  . mouth rinse  15 mL Mouth Rinse q12n4p    Continuous Infusions: . chlorproMAZINE (THORAZINE) IV    . morphine 6 mg/hr (01/09/20 0853)    PRN Meds: acetaminophen **OR** acetaminophen, albuterol, chlorproMAZINE (THORAZINE) IV, docusate sodium, glycopyrrolate **OR** glycopyrrolate **OR** glycopyrrolate, haloperidol **OR** haloperidol **OR** haloperidol lactate, ipratropium-albuterol, LORazepam **OR** LORazepam **OR** LORazepam, LORazepam, morphine injection, ondansetron **OR** ondansetron (ZOFRAN) IV, polyethylene glycol, polyvinyl alcohol  Physical Exam         Unresponsive Shallow breathing Has edema Abdomen not distended  Vital Signs: BP 111/64   Pulse (!) 56   Temp 98.2 F (36.8 C) (Axillary)   Resp 17   Ht 5\' 9"  (1.753 m)   Wt 67.3 kg   SpO2 99%   BMI 21.91 kg/m  SpO2: SpO2: 99 % O2 Device: O2 Device: Nasal Cannula O2 Flow Rate: O2 Flow Rate (L/min): 2 L/min  Intake/output summary:   Intake/Output Summary (Last 24 hours) at 01/09/2020 1135 Last data filed at 01/09/2020 0400 Gross per 24 hour  Intake 81.65 ml  Output --  Net 81.65 ml   LBM: Last BM Date: 01/08/20 Baseline Weight: Weight: 68 kg Most recent weight:  Weight:  (not done, patient comfort care)      PPS 10% Palliative Assessment/Data:      Patient Active Problem List   Diagnosis Date Noted  . Pleural effusion   . Malnutrition of moderate degree 01/05/2020  . Pressure injury of skin 01/11/2020  . Antineoplastic chemotherapy induced anemia 11/30/2019  . Hypokalemia 10/13/2019  . Decreased appetite 10/13/2019  . Chronic respiratory failure with hypoxia ([place]) 09/21/2019  . Non-small cell carcinoma of right lung, stage 4 ([place]) 09/14/2019  . Encounter for antineoplastic chemotherapy 09/14/2019  . Encounter for antineoplastic immunotherapy  09/14/2019  . Goals of care, counseling/discussion 09/14/2019  . Postobstructive pneumonia 09/09/2019  . Sepsis ([place]) 09/03/2019  . Acute kidney injury superimposed on chronic kidney disease ([place]) 09/03/2019  . Acute on chronic respiratory failure with hypoxia ([place]) 09/03/2019  . Cough 09/02/2019  . Decreased renal function 01/15/2019  . Chronic tension-type headache, not intractable 10/26/2018  . Edema of both ankles 10/26/2018  . Insomnia secondary to chronic pain 12/29/2017  . Prostate cancer ([place]) 12/10/2016  . Prediabetes 12/10/2016  . Preventative health care 12/10/2016  . Essential hypertension 06/03/2016  . Hyperlipidemia 06/03/2016  . Medication monitoring encounter 08/24/2013  . Chronic headaches 08/21/2012  . Chronic neck and back pain 08/21/2012    Palliative Care Assessment & Plan   Patient Profile:    Assessment:  comfort measures Actively dying Non small cell lung cancer   Recommendations/Plan:  Continue to up titrate Morphine opioid infusion for comfort measures  Anticipated hospital death  Prognosis hours to some very limited number of days.   Goals of Care and Additional Recommendations:  Limitations on Scope of Treatment: Full Comfort Care  Code Status:    Code Status Orders  (From admission, onward)         Start     Ordered   01/07/20 1751  Do  not attempt resuscitation (DNR)  Continuous       Question Answer Comment  In the event of cardiac or respiratory ARREST Do not call a "code blue"   In the event of cardiac or respiratory ARREST Do not perform Intubation, CPR, defibrillation or ACLS   In the event of cardiac or respiratory ARREST Use medication by any route, position, wound care, and other measures to relive pain and suffering. May use oxygen, suction and manual treatment of airway obstruction as needed for comfort.      01/07/20 1750        Code Status History    Date Active Date Inactive Code Status Order ID Comments User Context   01/16/2020 1843 01/07/2020 1750 DNR [id number]  Laurin Coder, MD ED   01/01/2020 1816 01/14/2020 1843 DNR [id number]  Lacretia Leigh, MD ED   09/03/2019 0728 09/09/2019 2141 Full Code [id number]  Norval Morton, MD ED   Advance Care Planning Activity    Advance Directive Documentation     Most Recent Value  Type of Advance Directive Healthcare Power of [place], Living will  Pre-existing out of facility DNR order (yellow form or pink MOST form) --  "MOST" Form in Place? --       Prognosis:   Hours - Days  Discharge Planning:  Anticipated Hospital Death  Care plan was discussed with [facility] MD.   Thank you for allowing the Palliative Medicine Team to assist in the care of this patient.   Time In: 9 Time Out: 9.15 Total Time 15 Prolonged Time Billed No       Greater than 50%  of this time was spent counseling and coordinating care related to the above assessment and plan.  Loistine Chance, MD  Please contact Palliative Medicine Team phone at [phone] for questions and concerns.

## 2020-01-09 NOTE — Progress Notes (Signed)
PROGRESS NOTE    Jerry Wong  OIZ:[id number] DOB: 1945/07/15 DOA: 12/27/2019 PCP: Pleas Koch, NP    Chief Complaint  Patient presents with   Shortness of Breath    Brief Narrative:  Stage IV (T4, N3, M1 C) non-small cell lung cancer presented with large right hilar mass in addition to mediastinal lymphadenopathy with bilateral adrenal metastasis and bony metastatic disease diagnosed in July 2021. Progressed on first line therapy, received second line therapy on   Subjective:  He is on morphine drip, does not appear in distress,not awake, has apnea episode,    Assessment & Plan:   Active Problems:   Sepsis ([place])   Pressure injury of skin   Malnutrition of moderate degree   Pleural effusion  Acute hypoxic respiratory failure/pneumonia/sepsis on presentation -With stage IV non-small cell lung cancer, large pleural effusion status post thoracentesis about a liter initially on 11/9, another bedside thoracentesis on 11/11 with 1400 cc's of clear yellow fluid was drained from the right pleural space -Sepsis secondary to pneumonia, was treated with Vanco and  cefepime,  -initially required bipap,  both critical care and oncology recommend palliative care consult  -patient had  a good day on 11/11 , was interactive and following commands, but deteriorated on 11/13, he is less responsive, tachypnea, am labs got worse, clinically suspect another aspiration event lead to deterioration, has long conversation with wife at bedside in the setting of stage IV cancer not responding to chemo,  he can not tolerate chemo, he is too weak to take oral safely without risking aspiration, clinically he likely suffered another aspiration event while on abx , care transitioned to full comfort measures on 11/12 pm -anticipate in hospital death  Palliative care consulted, input appreciated   Acute metabolic encephalopathy Ct head on 11/9 on acute findings He is oriented to person, year, not to the  months, and place, calm and cooperative on 11/11,clinically deteriorated , now full comfort measures   A. 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 progressed on first line treatment Second line systemic chemotherapy with docetaxel 60 mg/M2 and Cyramza 10 mg/KG every 3 weeks with Neulasta support.  First dose December 28, 2019. Oncology and palliative care consulted for cancer care and goals of care discussion shift to full comfort measures  Anemia and thrombocytopenia in the setting of malignancy -He received a blood transfusion prior to recent chemotherapy -No blood transfusion needed during this hospitalization -shift to full comfort measures  FTT/generalized deconditioning, malnutrition Body mass index is 21.91 kg/m.  Needs goals of care discussion, oncology Dr Julien Nordmann agrees.    Pm addendum: 5:54pm, patient started to have increased discomfort and continued decreased mentation, I have discussed with wife , now transition to full comfort  Measures     DVT prophylaxis: SCDs Start: 01/13/2020 1842   Code Status:DNR Family Communication:  wife updated over the phone Disposition:   Status is: Inpatient  Dispo: The patient is from: home              Anticipated d/c is to: inpatient death vs residential  hospice  Consultants:   Critical care  Oncology  Palliative care  Procedures:   thoracentesis  Antimicrobials:   As above     Objective: Vitals:   01/09/20 0800 01/09/20 0809 01/09/20 1200 01/09/20 1406  BP:  (!) 154/121  100/65  Pulse: (!) 114  (!) 58 63    Resp: 15  (!) 27 10  Temp:    (!) 100.6 F (38.1 C)  TempSrc:    Oral  SpO2: 94%  98% 95%  Weight:      Height:        Intake/Output Summary (Last 24 hours) at 01/09/2020 1826 Last data filed at 01/09/2020 1300 Gross per 24 hour  Intake 106.52 ml  Output --  Net 106.52 ml   Filed Weights   01/05/20 0500 01/06/20 0500 01/07/20 0441  Weight: 69.5 kg 68.9 kg 67.3 kg    Examination:  General exam: no awake, no morphine drip, no distress noted Respiratory system: poor Respiratory effort     Data Reviewed: I have personally reviewed following labs and imaging studies  CBC: Recent Labs  Lab 01/17/2020 1330 01/05/20 0025 01/06/20 1137 01/07/20 0246  WBC 7.7 4.6 9.9 15.3*  NEUTROABS 6.2 3.3 7.8* 11.8*  HGB 10.0* 8.6* 8.9* 9.0*  HCT 32.8* 27.7* 28.1* 28.5*  MCV 98.5 97.2 96.6 96.3  PLT 106* 82* 77* 66*    Basic Metabolic Panel: Recent Labs  Lab 01/14/2020 1330 01/05/20 0025 01/06/20 1137 01/07/20 0246  NA 143 141 148* 147*  K 4.8 4.0 3.0* 4.1  CL 109 110 114* 117*  CO2 20* 16* 18* 18*  GLUCOSE 131* 139* 133* 187*  BUN 51* 49* 46* 45*  CREATININE 1.11 1.03 1.07 1.07  CALCIUM 8.9 7.8* 8.1* 8.3*  MG  --   --  1.8 1.9    GFR: Estimated Creatinine Clearance: 59.4 mL/min (by C-G formula based on SCr of 1.07 mg/dL).  Liver Function Tests: Recent Labs  Lab 01/15/2020 1330 01/06/20 1137 01/07/20 0246  AST 62* 38 33  ALT 62* 45* 37  ALKPHOS 212* 136* 145*  BILITOT 1.3* 1.9* 1.7*  PROT 6.9 5.9* 5.8*  ALBUMIN 2.3* 2.5* 2.4*    CBG: No results for input(s): GLUCAP in the last 168 hours.   Recent Results (from the past 240 hour(s))  Blood culture (routine x 2)     Status: None   Collection Time: 01/17/2020  1:29 PM   Specimen: BLOOD  Result Value Ref Range Status   Specimen Description   Final    BLOOD RIGHT ANTECUBITAL Performed at [facility] [address], [place], [place] [address]    Special Requests   Final    BOTTLES DRAWN  AEROBIC AND ANAEROBIC Blood Culture adequate volume Performed at [facility] [address] [place], [place] [address]    Culture   Final    NO GROWTH 5 DAYS Performed at [facility], [place] [address] [place], [place] [address]    Report Status 01/09/2020 FINAL  Final  Blood culture (routine x 2)     Status: None   Collection Time: 01/05/2020  1:30 PM   Specimen: BLOOD RIGHT HAND  Result Value Ref Range Status   Specimen Description   Final    BLOOD RIGHT HAND Performed at [facility] [address] [place], [place] [address]    Special Requests   Final    BOTTLES DRAWN AEROBIC AND ANAEROBIC Blood Culture results may not be optimal  due to an inadequate volume of blood received in culture bottles Performed at [facility] [address], [place], [place] [address]    Culture   Final    NO GROWTH 5 DAYS Performed at [facility], [place] [address] [place], [place] [address]    Report Status 01/09/2020 FINAL  Final  Respiratory Panel by RT PCR (Flu A&B, Covid) - Nasopharyngeal Swab     Status: None   Collection Time: 01/19/2020  2:11 PM   Specimen: Nasopharyngeal Swab  Result Value Ref Range Status   SARS Coronavirus 2 by RT PCR NEGATIVE NEGATIVE Final    Comment: (NOTE) SARS-CoV-2 target nucleic acids are NOT DETECTED.  The SARS-CoV-2 RNA is generally detectable in upper respiratoy specimens during the acute phase of infection. The lowest concentration of SARS-CoV-2 viral copies this assay can detect is 131 copies/mL. A negative result does not preclude SARS-Cov-2 infection and should not be used as the sole basis for treatment or other patient management decisions. A negative result may occur with  improper specimen collection/handling, submission of specimen other than nasopharyngeal swab, presence of viral mutation(s) within the areas targeted by this assay, and inadequate number of viral copies (<131  copies/mL). A negative result must be combined with clinical observations, patient history, and epidemiological information. The expected result is Negative.  Fact Sheet for Patients:  PinkCheek.be  Fact Sheet for Healthcare Providers:  GravelBags.it  This test is no t yet approved or cleared by the [place] FDA and  has been authorized for detection and/or diagnosis of SARS-CoV-2 by FDA under an Emergency Use Authorization (EUA). This EUA will remain  in effect (meaning this test can be used) for the duration of the COVID-19 declaration under Section 564(b)(1) of the Act, 21 U.S.C. section 360bbb-3(b)(1), unless the authorization is terminated or revoked sooner.     Influenza A by PCR NEGATIVE NEGATIVE Final   Influenza B by PCR NEGATIVE NEGATIVE Final    Comment: (NOTE) The Xpert Xpress SARS-CoV-2/FLU/RSV assay is intended as an aid in  the diagnosis of influenza from Nasopharyngeal swab specimens and  should not be used as a sole basis for treatment. Nasal washings and  aspirates are unacceptable for Xpert Xpress SARS-CoV-2/FLU/RSV  testing.  Fact Sheet for Patients: PinkCheek.be  Fact Sheet for Healthcare Providers: GravelBags.it  This test is not yet approved or cleared by the [place] FDA and  has been authorized for detection and/or diagnosis of SARS-CoV-2 by  FDA under an Emergency Use Authorization (EUA). This EUA will remain  in effect (meaning this test can be used) for the duration of the  Covid-19 declaration under Section 564(b)(1) of the Act, 21  U.S.C. section 360bbb-3(b)(1), unless the authorization is  terminated or revoked. Performed at [facility], [place] [address], [place], [place] [address]   Urine culture     Status: None   Collection Time: 12/29/2019  2:54 PM   Specimen: In/Out Cath Urine  Result Value Ref  Range Status   Specimen Description   Final    IN/OUT CATH URINE Performed at [facility] [address], [place], [place] [address]    Special Requests   Final    NONE Performed at [facility], [place] [address], [place], [place] [address]    Culture   Final    NO GROWTH Performed at [facility], [place] [address], [place], [place] [address]    Report Status  01/06/2020 FINAL  Final  Body fluid culture (includes gram stain)     Status: None   Collection Time: 01/18/2020  7:17 PM   Specimen: Pleural Fluid  Result Value Ref Range Status   Specimen Description   Final    PLEURAL Performed at [facility] [address], [place], [place] [address]    Special Requests   Final    NONE Performed at [facility], [place] [address] [place], [place] [address]    Gram Stain   Final    FEW WBC PRESENT,BOTH PMN AND MONONUCLEAR NO ORGANISMS SEEN    Culture   Final    NO GROWTH Performed at [facility], [place] [address] [place], [place] [address]    Report Status 01/08/2020 FINAL  Final  MRSA PCR Screening     Status: None   Collection Time: 12/27/2019  8:51 PM   Specimen: Nasopharyngeal  Result Value Ref Range Status   MRSA by PCR NEGATIVE NEGATIVE Final    Comment:        The GeneXpert MRSA Assay (FDA approved for NASAL specimens only), is one component of a comprehensive MRSA colonization surveillance program. It is not intended to diagnose MRSA infection nor to guide or monitor treatment for MRSA infections. Performed at [facility] [place] [address] [place], [place] [address]   C Difficile Quick Screen w PCR reflex     Status: None   Collection Time: 01/06/20  5:45 PM   Specimen: STOOL  Result Value Ref Range Status   C Diff antigen NEGATIVE NEGATIVE Final   C Diff toxin NEGATIVE NEGATIVE Final   C Diff interpretation No C. difficile detected.  Final    Comment:  Performed at [facility], [place] [address] [place], [place] [address]         Radiology Studies: No results found.      Scheduled Meds:  chlorhexidine  15 mL Mouth Rinse BID   Chlorhexidine Gluconate Cloth  6 each Topical Daily   lip balm   Topical BID   mouth rinse  15 mL Mouth Rinse q12n4p   Continuous Infusions:  chlorproMAZINE (THORAZINE) IV     morphine 7 mg/hr (01/09/20 1553)     LOS: 5 days   Time spent: 5mins Greater than 50% of this time was spent in counseling, explanation of diagnosis, planning of further management, and coordination of care.  I have discussed plan of care as described above with RN  on 01/09/2020  Voice Recognition /Dragon dictation system was used to create this note, attempts have been made to correct errors. Please contact the author with questions and/or clarifications.   Florencia Reasons, MD PhD FACP [facility]  Available via Epic secure chat 7am-7pm for nonurgent issues Please page for urgent issues To page the attending provider between 7A-7P or the covering provider during after hours 7P-7A, please log into the web site [email or web address] and access using universal [place] password for that web site. If you do not have the password, please call the hospital operator.    01/09/2020, 6:26 PM

## 2020-01-10 DIAGNOSIS — A419 Sepsis, unspecified organism: Secondary | ICD-10-CM | POA: Diagnosis not present

## 2020-01-10 DIAGNOSIS — Z515 Encounter for palliative care: Secondary | ICD-10-CM

## 2020-01-10 DIAGNOSIS — Z7189 Other specified counseling: Secondary | ICD-10-CM | POA: Diagnosis not present

## 2020-01-10 DIAGNOSIS — R6520 Severe sepsis without septic shock: Secondary | ICD-10-CM | POA: Diagnosis not present

## 2020-01-10 DIAGNOSIS — R531 Weakness: Secondary | ICD-10-CM | POA: Diagnosis not present

## 2020-01-10 DIAGNOSIS — J9601 Acute respiratory failure with hypoxia: Secondary | ICD-10-CM | POA: Diagnosis not present

## 2020-01-10 LAB — CYTOLOGY - NON PAP

## 2020-01-10 NOTE — Progress Notes (Signed)
Daily Progress Note   Patient Name: Jerry Wong       Date: 01/10/2020 DOB: 21-Nov-1945  Age: 74 y.o. MRN#: [id number] Attending Physician: Shelly Coss, MD Primary Care Physician: Pleas Koch, NP Admit Date: 12/27/2019  Reason for Consultation/Follow-up: Terminal Care  Subjective:actively dying, appears comfortable, no non verbal gestures of distress or discomfort. Son and daughter-in-law at bedside holding vigil, offered supportive presents to them, wife spent the night and has gone home to rest, we discussed about end-of-life signs and symptoms.  Length of Stay: 6  Current Medications: Scheduled Meds:  . chlorhexidine  15 mL Mouth Rinse BID  . Chlorhexidine Gluconate Cloth  6 each Topical Daily  . lip balm   Topical BID  . mouth rinse  15 mL Mouth Rinse q12n4p    Continuous Infusions: . chlorproMAZINE (THORAZINE) IV    . morphine 7 mg/hr (01/10/20 1028)    PRN Meds: acetaminophen **OR** acetaminophen, albuterol, chlorproMAZINE (THORAZINE) IV, docusate sodium, glycopyrrolate **OR** glycopyrrolate **OR** glycopyrrolate, haloperidol **OR** haloperidol **OR** haloperidol lactate, ipratropium-albuterol, LORazepam **OR** LORazepam **OR** LORazepam, LORazepam, morphine injection, ondansetron **OR** ondansetron (ZOFRAN) IV, polyethylene glycol, polyvinyl alcohol  Physical Exam         Unresponsive Shallow breathing Has edema Abdomen not distended Apneic pauses Eyes open fixed stare upwards  Vital Signs: BP 100/65 (BP Location: Right Arm)   Pulse 63   Temp (!) 100.6 F (38.1 C) (Oral)   Resp 10   Ht 5\' 9"  (1.753 m)   Wt 67 kg   SpO2 95%   BMI 21.81 kg/m  SpO2: SpO2: 95 % O2 Device: O2 Device: Nasal Cannula O2 Flow Rate: O2 Flow Rate (L/min): 4 L/min  Intake/output  summary:   Intake/Output Summary (Last 24 hours) at 01/10/2020 1251 Last data filed at 01/10/2020 0946 Gross per 24 hour  Intake 6 ml  Output --  Net 6 ml   LBM: Last BM Date: 01/09/20 Baseline Weight: Weight: 68 kg Most recent weight: Weight: 67 kg      PPS 10% Palliative Assessment/Data:      Patient Active Problem List   Diagnosis Date Noted  . Pleural effusion   . Malnutrition of moderate degree 01/05/2020  . Pressure injury of skin 01/05/2020  . Antineoplastic chemotherapy induced anemia 11/30/2019  . Hypokalemia 10/13/2019  . Decreased appetite  10/13/2019  . Chronic respiratory failure with hypoxia ([place]) 09/21/2019  . Non-small cell carcinoma of right lung, stage 4 ([place]) 09/14/2019  . Encounter for antineoplastic chemotherapy 09/14/2019  . Encounter for antineoplastic immunotherapy 09/14/2019  . Goals of care, counseling/discussion 09/14/2019  . Postobstructive pneumonia 09/09/2019  . Sepsis ([place]) 09/03/2019  . Acute kidney injury superimposed on chronic kidney disease ([place]) 09/03/2019  . Acute on chronic respiratory failure with hypoxia ([place]) 09/03/2019  . Cough 09/02/2019  . Decreased renal function 01/15/2019  . Chronic tension-type headache, not intractable 10/26/2018  . Edema of both ankles 10/26/2018  . Insomnia secondary to chronic pain 12/29/2017  . Prostate cancer ([place]) 12/10/2016  . Prediabetes 12/10/2016  . Preventative health care 12/10/2016  . Essential hypertension 06/03/2016  . Hyperlipidemia 06/03/2016  . Medication monitoring encounter 08/24/2013  . Chronic headaches 08/21/2012  . Chronic neck and back pain 08/21/2012    Palliative Care Assessment & Plan   Patient Profile:    Assessment:  comfort measures Actively dying Non small cell lung cancer   Recommendations/Plan:  Continue to up titrate Morphine opioid infusion for comfort measures  Anticipated hospital death  Prognosis hours to some very limited number of days.    Goals of Care and Additional Recommendations:  Limitations on Scope of Treatment: Full Comfort Care  Code Status:    Code Status Orders  (From admission, onward)         Start     Ordered   01/07/20 1751  Do not attempt resuscitation (DNR)  Continuous       Question Answer Comment  In the event of cardiac or respiratory ARREST Do not call a "code blue"   In the event of cardiac or respiratory ARREST Do not perform Intubation, CPR, defibrillation or ACLS   In the event of cardiac or respiratory ARREST Use medication by any route, position, wound care, and other measures to relive pain and suffering. May use oxygen, suction and manual treatment of airway obstruction as needed for comfort.      01/07/20 1750        Code Status History    Date Active Date Inactive Code Status Order ID Comments User Context   01/22/2020 1843 01/07/2020 1750 DNR [id number]  Laurin Coder, MD ED   12/31/2019 1816 01/11/2020 1843 DNR [id number]  Lacretia Leigh, MD ED   09/03/2019 0728 09/09/2019 2141 Full Code [id number]  Norval Morton, MD ED   Advance Care Planning Activity    Advance Directive Documentation     Most Recent Value  Type of Advance Directive Healthcare Power of [place], Living will  Pre-existing out of facility DNR order (yellow form or pink MOST form) --  "MOST" Form in Place? --       Prognosis:   Hours - Days  Discharge Planning:  Anticipated Hospital Death  Care plan was discussed with [facility] MD, son and daughter-in-law holding vigil at the bedside.   Thank you for allowing the Palliative Medicine Team to assist in the care of this patient.   Time In: 9 Time Out: 9.25 Total Time 25 Prolonged Time Billed No       Greater than 50%  of this time was spent counseling and coordinating care related to the above assessment and plan.  Loistine Chance, MD  Please contact Palliative Medicine Team phone at [phone] for questions and concerns.

## 2020-01-10 NOTE — Progress Notes (Signed)
BiPAP PRN not in room.

## 2020-01-10 NOTE — Progress Notes (Signed)
PROGRESS NOTE    Jerry Wong  BMW:[id number] DOB: 1945-11-16 DOA: 01/08/2020 PCP: Pleas Koch, NP   Chief Complain: Shortness of breath  Brief Narrative: Patient is a 74 year old male with history of stage IV non-small cell lung cancer who presented with large hilar mass,mediastinal  lymphadenopathy, bilateral adrenal metastasis, bony metastatic disease presented with shortness of breath. There was also complained of altered mentation at home.  On presentation, he was found to have large.  Effusion on the left side, started on BiPAP.  Palliative care consulted for goals of care.  Currently on comfort care.  Assessment & Plan:   Active Problems:   Sepsis ([place])   Pressure injury of skin   Malnutrition of moderate degree   Pleural effusion   Acute hypoxic respiratory failure: Due to combination of pneumonia, pleural effusion, metastatic lung cancer.  Status post thoracentesis for large left-sided pleural effusion x2.  Required BiPAP on presentation. Currently on comfort care.  Sepsis/pneumonia: Was on antibiotics before.  Suspected to be from aspiration.  Currently on comfort care.  Acute metabolic encephalopathy: Confusion on  presentation.  Now on full comfort care  New onset A. fib: Initially on Cardizem drip and therapeutic Lovenox.  Now on comfort care  Stage IV lung cancer: Has large right hilar mass, mediastinal lymphadenopathy, bilateral adrenal metastasis, bony metastatic disease.  Diagnosed in thousand 21.  Was on second line chemotherapy.  Oncology following.  Anemia/thrombocytopenia: In the setting of malignancy.   Goals of care: Advanced malignancy with multiple comorbidities.  Currently on full comfort care.  Anticipate hospital death.  Palliative care following  Nutrition Problem: Moderate Malnutrition Etiology: chronic illness, cancer and cancer related treatments      DVT prophylaxis:SCD Code Status: DNR Family Communication: None at bedside Status is:  Inpatient  Remains inpatient appropriate because:Inpatient level of care appropriate due to severity of illness   Dispo: The patient is from: Home              Anticipated d/c is to: Anticipate hospital death              Anticipated d/c date is: 2 days              Patient currently is not medically stable to d/c.     Consultants: Oncology, palliative care Procedures: Thoracentesis Antimicrobials:  Anti-infectives (From admission, onward)   Start     Dose/Rate Route Frequency Ordered Stop   01/05/20 1800  ceFEPIme (MAXIPIME) 2 g in sodium chloride 0.9 % 100 mL IVPB  Status:  Discontinued        2 g 200 mL/hr over 30 Minutes Intravenous Every 8 hours 01/05/20 1219 01/07/20 1747   01/10/2020 2200  ceFEPIme (MAXIPIME) 2 g in sodium chloride 0.9 % 100 mL IVPB  Status:  Discontinued        2 g 200 mL/hr over 30 Minutes Intravenous Every 12 hours 12/31/2019 1914 01/05/20 1219   01/19/2020 1530  cefTRIAXone (ROCEPHIN) 1 g in sodium chloride 0.9 % 100 mL IVPB  Status:  Discontinued        1 g 200 mL/hr over 30 Minutes Intravenous  Once 01/20/2020 1524 01/17/2020 1529   01/23/2020 1530  azithromycin (ZITHROMAX) 500 mg in sodium chloride 0.9 % 250 mL IVPB  Status:  Discontinued        500 mg 250 mL/hr over 60 Minutes Intravenous  Once 01/03/2020 1524 01/01/2020 1529   01/05/2020 1415  ceFEPIme (MAXIPIME) 2 g in sodium chloride 0.9 %  100 mL IVPB        2 g 200 mL/hr over 30 Minutes Intravenous  Once 01/05/2020 1402 01/15/2020 1500   01/07/2020 1415  metroNIDAZOLE (FLAGYL) IVPB 500 mg        500 mg 100 mL/hr over 60 Minutes Intravenous  Once 01/01/2020 1402 01/14/2020 1602   01/05/2020 1415  vancomycin (VANCOCIN) IVPB 1000 mg/200 mL premix  Status:  Discontinued        1,000 mg 200 mL/hr over 60 Minutes Intravenous  Once 01/07/2020 1402 12/31/2019 1413   01/02/2020 1415  vancomycin (VANCOREADY) IVPB 1250 mg/250 mL        1,250 mg 166.7 mL/hr over 90 Minutes Intravenous  Once 01/03/2020 1413 01/19/2020 1807       Subjective:  Patient seen and examined at the bedside this morning.  On full comfort care.  Comfortable during my evaluation.  Having shallow breathings.  Wife at the bedside.   Objective: Vitals:   01/09/20 0809 01/09/20 1200 01/09/20 1406 01/10/20 0448  BP: (!) 154/121  100/65   Pulse:  (!) 58 63   Resp:  (!) 27 10   Temp:   (!) 100.6 F (38.1 C)   TempSrc:   Oral   SpO2:  98% 95%   Weight:    67 kg  Height:        Intake/Output Summary (Last 24 hours) at 01/10/2020 1033 Last data filed at 01/10/2020 0946 Gross per 24 hour  Intake 48.85 ml  Output --  Net 48.85 ml   Filed Weights   01/06/20 0500 01/07/20 0441 01/10/20 0448  Weight: 68.9 kg 67.3 kg 67 kg    Examination:  General exam: Terminally ill HEENT:Eyes closed Respiratory system: Shallow respiration Cardiovascular system: S1 & S2 heard, RRR. No JVD, murmurs, rubs, gallops or clicks. No pedal edema. Gastrointestinal system: Abdomen is nondistended, soft and nontender. No organomegaly or masses felt. Normal bowel sounds heard. Central nervous system: Not alert and oriented.  Extremities: No edema, no clubbing ,no cyanosis  Data Reviewed: I have personally reviewed following labs and imaging studies  CBC: Recent Labs  Lab 01/21/2020 1330 01/05/20 0025 01/06/20 1137 01/07/20 0246  WBC 7.7 4.6 9.9 15.3*  NEUTROABS 6.2 3.3 7.8* 11.8*  HGB 10.0* 8.6* 8.9* 9.0*  HCT 32.8* 27.7* 28.1* 28.5*  MCV 98.5 97.2 96.6 96.3  PLT 106* 82* 77* 66*   Basic Metabolic Panel: Recent Labs  Lab 01/07/2020 1330 01/05/20 0025 01/06/20 1137 01/07/20 0246  NA 143 141 148* 147*  K 4.8 4.0 3.0* 4.1  CL 109 110 114* 117*  CO2 20* 16* 18* 18*  GLUCOSE 131* 139* 133* 187*  BUN 51* 49* 46* 45*  CREATININE 1.11 1.03 1.07 1.07  CALCIUM 8.9 7.8* 8.1* 8.3*  MG  --   --  1.8 1.9   GFR: Estimated Creatinine Clearance: 59.1 mL/min (by C-G formula based on SCr of 1.07 mg/dL). Liver Function Tests: Recent Labs  Lab  01/16/2020 1330 01/06/20 1137 01/07/20 0246  AST 62* 38 33  ALT 62* 45* 37  ALKPHOS 212* 136* 145*  BILITOT 1.3* 1.9* 1.7*  PROT 6.9 5.9* 5.8*  ALBUMIN 2.3* 2.5* 2.4*   No results for input(s): LIPASE, AMYLASE in the last 168 hours. No results for input(s): AMMONIA in the last 168 hours. Coagulation Profile: Recent Labs  Lab 01/06/2020 1330  INR 1.3*   Cardiac Enzymes: No results for input(s): CKTOTAL, CKMB, CKMBINDEX, TROPONINI in the last 168 hours. BNP (last 3 results) No  results for input(s): PROBNP in the last 8760 hours. HbA1C: No results for input(s): HGBA1C in the last 72 hours. CBG: No results for input(s): GLUCAP in the last 168 hours. Lipid Profile: No results for input(s): CHOL, HDL, LDLCALC, TRIG, CHOLHDL, LDLDIRECT in the last 72 hours. Thyroid Function Tests: No results for input(s): TSH, T4TOTAL, FREET4, T3FREE, THYROIDAB in the last 72 hours. Anemia Panel: No results for input(s): VITAMINB12, FOLATE, FERRITIN, TIBC, IRON, RETICCTPCT in the last 72 hours. Sepsis Labs: Recent Labs  Lab 01/17/2020 2244 01/05/20 0025 01/06/20 1137 01/07/20 0246  LATICACIDVEN 3.7* 3.0* 1.5 2.2*    Recent Results (from the past 240 hour(s))  Blood culture (routine x 2)     Status: None   Collection Time: 01/07/2020  1:29 PM   Specimen: BLOOD  Result Value Ref Range Status   Specimen Description   Final    BLOOD RIGHT ANTECUBITAL Performed at [facility] [address], [place], [place] [address]    Special Requests   Final    BOTTLES DRAWN AEROBIC AND ANAEROBIC Blood Culture adequate volume Performed at [facility] [address] [place], [place] [address]    Culture   Final    NO GROWTH 5 DAYS Performed at [facility], [place] [address], [place], [place] [address]    Report Status 01/09/2020 FINAL  Final  Blood culture (routine x 2)     Status: None   Collection Time: 01/22/2020  1:30 PM   Specimen: BLOOD RIGHT HAND   Result Value Ref Range Status   Specimen Description   Final    BLOOD RIGHT HAND Performed at [facility] [address] [place], [place] [address]    Special Requests   Final    BOTTLES DRAWN AEROBIC AND ANAEROBIC Blood Culture results may not be optimal due to an inadequate volume of blood received in culture bottles Performed at [facility] [address] [place], [place] [address]    Culture   Final    NO GROWTH 5 DAYS Performed at [facility], [place] [address] [place], [place] [address]    Report Status 01/09/2020 FINAL  Final  Respiratory Panel by RT PCR (Flu A&B, Covid) - Nasopharyngeal Swab     Status: None   Collection Time: 01/03/2020  2:11 PM   Specimen: Nasopharyngeal Swab  Result Value Ref Range Status   SARS Coronavirus 2 by RT PCR NEGATIVE NEGATIVE Final    Comment: (NOTE) SARS-CoV-2 target nucleic acids are NOT DETECTED.  The SARS-CoV-2 RNA is generally detectable in upper respiratoy specimens during the acute phase of infection. The lowest concentration of SARS-CoV-2 viral copies this assay can detect is 131 copies/mL. A negative result does not preclude SARS-Cov-2 infection and should not be used as the sole basis for treatment or other patient management decisions. A negative result may occur with  improper specimen collection/handling, submission of specimen other than nasopharyngeal swab, presence of viral mutation(s) within the areas targeted by this assay, and inadequate number of viral copies (<131 copies/mL). A negative result must be combined with clinical observations, patient history, and epidemiological information. The expected result is Negative.  Fact Sheet for Patients:  PinkCheek.be  Fact Sheet for Healthcare Providers:  GravelBags.it  This test is no t yet approved or cleared by the [place] FDA and  has been authorized for  detection and/or diagnosis of SARS-CoV-2 by FDA under an Emergency Use Authorization (EUA). This  EUA will remain  in effect (meaning this test can be used) for the duration of the COVID-19 declaration under Section 564(b)(1) of the Act, 21 U.S.C. section 360bbb-3(b)(1), unless the authorization is terminated or revoked sooner.     Influenza A by PCR NEGATIVE NEGATIVE Final   Influenza B by PCR NEGATIVE NEGATIVE Final    Comment: (NOTE) The Xpert Xpress SARS-CoV-2/FLU/RSV assay is intended as an aid in  the diagnosis of influenza from Nasopharyngeal swab specimens and  should not be used as a sole basis for treatment. Nasal washings and  aspirates are unacceptable for Xpert Xpress SARS-CoV-2/FLU/RSV  testing.  Fact Sheet for Patients: PinkCheek.be  Fact Sheet for Healthcare Providers: GravelBags.it  This test is not yet approved or cleared by the [place] FDA and  has been authorized for detection and/or diagnosis of SARS-CoV-2 by  FDA under an Emergency Use Authorization (EUA). This EUA will remain  in effect (meaning this test can be used) for the duration of the  Covid-19 declaration under Section 564(b)(1) of the Act, 21  U.S.C. section 360bbb-3(b)(1), unless the authorization is  terminated or revoked. Performed at [facility], [place] [address], [place], [place] [address]   Urine culture     Status: None   Collection Time: 01/16/2020  2:54 PM   Specimen: In/Out Cath Urine  Result Value Ref Range Status   Specimen Description   Final    IN/OUT CATH URINE Performed at [facility] [address] [place], [place] [address]    Special Requests   Final    NONE Performed at [facility], [place] [address] [place], [place] [address]    Culture   Final    NO GROWTH Performed at [facility], [place] [address], [place], [place] [address]    Report Status  01/06/2020 FINAL  Final  Body fluid culture (includes gram stain)     Status: None   Collection Time: 01/09/2020  7:17 PM   Specimen: Pleural Fluid  Result Value Ref Range Status   Specimen Description   Final    PLEURAL Performed at [facility] [address] [place], [place] [address]    Special Requests   Final    NONE Performed at [facility], [place] [address] [place], [place] [address]    Gram Stain   Final    FEW WBC PRESENT,BOTH PMN AND MONONUCLEAR NO ORGANISMS SEEN    Culture   Final    NO GROWTH Performed at [facility], [place] [address] [place], [place] [address]    Report Status 01/08/2020 FINAL  Final  MRSA PCR Screening     Status: None   Collection Time: 01/11/2020  8:51 PM   Specimen: Nasopharyngeal  Result Value Ref Range Status   MRSA by PCR NEGATIVE NEGATIVE Final    Comment:        The GeneXpert MRSA Assay (FDA approved for NASAL specimens only), is one component of a comprehensive MRSA colonization surveillance program. It is not intended to diagnose MRSA infection nor to guide or monitor treatment for MRSA infections. Performed at [facility], [place] [address], [place], [place] [address]   C Difficile Quick Screen w PCR reflex     Status: None   Collection Time: 01/06/20  5:45 PM   Specimen: STOOL  Result Value Ref Range Status   C Diff antigen NEGATIVE NEGATIVE Final   C Diff  toxin NEGATIVE NEGATIVE Final   C Diff interpretation No C. difficile detected.  Final    Comment: Performed at [facility], [place] [address], [place], [place] [address]         Radiology Studies: No results found.      Scheduled Meds: . chlorhexidine  15 mL Mouth Rinse BID  . Chlorhexidine Gluconate Cloth  6 each Topical Daily  . lip balm   Topical BID  . mouth rinse  15 mL Mouth Rinse q12n4p   Continuous Infusions: . chlorproMAZINE (THORAZINE) IV    . morphine 7 mg/hr  (01/10/20 1028)     LOS: 6 days    Time spent:15 mins.More than 50% of that time was spent in counseling and/or coordination of care.      Shelly Coss, MD [facility] P11/15/2021, 10:33 AM

## 2020-01-10 NOTE — Progress Notes (Signed)
Chaplain engaged in initial visit with Jerry Wong, his wife, two sons, and daughter-in-law.  Chaplain offered support.  During visit, chaplain learned that Jerry Wong and his wife have an anniversary coming up.  They have been married almost 36 years having met at work.  Mrs. Stegmaier became tearful as she expressed this upcoming milestone.  Mrs. Sciascia described Jerry Wong as being one of the most fearless and intelligent men that she knows.  She also noted that he is quite humorous and has  brought a lot of joy to their lives.  Jerry Wong loves animals as well, specifically his chickens and dogs.  Mrs. Matson expressed that she and her husband had a number of similarities from their humor to the shows they loved to watch.    Mrs. Boxx expressed that Jerry Wong was diagnosed June or July after becoming sick with pneumonia and that he was already in stage four.  Mrs. Zavadil detailed the diagnosis to today as being "fast" and feeling like a whirlwind.  Chaplain affirmed that thought and asked how Jerry Wong had been able to cope with his diagnosis. Mrs. Horne noted that he still utilized humor.  She went into describing his decline over time.  Chaplain offered the ministries of support, listening and presence, and affirmed the journey that Jerry Wong and his family have bee on.  Chaplain will follow-up as needed.    01/10/20 1000  Clinical Encounter Type  Visited With Patient and family together  Visit Type Initial;Spiritual support  Referral From Palliative care team  Consult/Referral To Chaplain  Spiritual Encounters  Spiritual Needs Emotional;Grief support

## 2020-01-10 NOTE — Care Management Important Message (Signed)
Important Message  Patient Details IM Letter given to the Patient. Name: Jerry Wong MRN: [id number] Date of Birth: December 27, 1945   Medicare Important Message Given:  Yes     Kerin Salen 01/10/2020, 11:16 AM

## 2020-01-11 ENCOUNTER — Inpatient Hospital Stay: Payer: Medicare Other

## 2020-01-11 ENCOUNTER — Ambulatory Visit: Payer: Medicare Other

## 2020-01-11 ENCOUNTER — Other Ambulatory Visit: Payer: Medicare Other

## 2020-01-11 ENCOUNTER — Ambulatory Visit: Payer: Medicare Other | Admitting: Physician Assistant

## 2020-01-11 DIAGNOSIS — Z515 Encounter for palliative care: Secondary | ICD-10-CM | POA: Diagnosis not present

## 2020-01-11 DIAGNOSIS — J9601 Acute respiratory failure with hypoxia: Secondary | ICD-10-CM | POA: Diagnosis not present

## 2020-01-11 DIAGNOSIS — R0602 Shortness of breath: Secondary | ICD-10-CM | POA: Diagnosis not present

## 2020-01-11 DIAGNOSIS — J90 Pleural effusion, not elsewhere classified: Secondary | ICD-10-CM | POA: Diagnosis not present

## 2020-01-11 DIAGNOSIS — R6520 Severe sepsis without septic shock: Secondary | ICD-10-CM | POA: Diagnosis not present

## 2020-01-11 DIAGNOSIS — A419 Sepsis, unspecified organism: Secondary | ICD-10-CM | POA: Diagnosis not present

## 2020-01-11 NOTE — Progress Notes (Signed)
PROGRESS NOTE    Jerry Wong  MWN:[id number] DOB: [birth year] DOA: 01/10/2020 PCP: Pleas Koch, NP   Chief Complain: Shortness of breath  Brief Narrative: Patient is a 74 year old male with history of stage IV non-small cell lung cancer who presented with large hilar mass,mediastinal  lymphadenopathy, bilateral adrenal metastasis, bony metastatic disease presented with shortness of breath. There was also complained of altered mentation at home.  On presentation, he was found to have large.  Effusion on the left side, started on BiPAP.  Palliative care consulted for goals of care.  Currently on comfort care.  Assessment & Plan:   Active Problems:   Sepsis ([place])   Pressure injury of skin   Malnutrition of moderate degree   Pleural effusion   Palliative care by specialist   General weakness   Dying care   Acute hypoxic respiratory failure: Due to combination of pneumonia, pleural effusion, metastatic lung cancer.  Status post thoracentesis for large left-sided pleural effusion x2.  Required BiPAP on presentation. Currently on comfort care.  Sepsis/pneumonia: Was on antibiotics before.  Suspected to be from aspiration.  Currently on comfort care.  Acute metabolic encephalopathy: Confusion on  presentation.  Now on full comfort care  New onset A. fib: Initially on Cardizem drip and therapeutic Lovenox.  Now on comfort care  Stage IV lung cancer: Has large right hilar mass, mediastinal lymphadenopathy, bilateral adrenal metastasis, bony metastatic disease.  Diagnosed in thousand 21.  Was on second line chemotherapy.  Oncology following.  Anemia/thrombocytopenia: In the setting of malignancy.   Goals of care: Advanced malignancy with multiple comorbidities.  Currently on full comfort care.  Anticipate hospital death.  Palliative care following  Nutrition Problem: Moderate Malnutrition Etiology: chronic illness, cancer and cancer related treatments      DVT  prophylaxis:SCD Code Status: DNR Family Communication: Family members at bedside Status is: Inpatient  Remains inpatient appropriate because:Inpatient level of care appropriate due to severity of illness   Dispo: The patient is from: Home              Anticipated d/c is to: Anticipate hospital death              Patient currently is not medically stable to d/c.     Consultants: Oncology, palliative care Procedures: Thoracentesis Antimicrobials:  Anti-infectives (From admission, onward)   Start     Dose/Rate Route Frequency Ordered Stop   01/05/20 1800  ceFEPIme (MAXIPIME) 2 g in sodium chloride 0.9 % 100 mL IVPB  Status:  Discontinued        2 g 200 mL/hr over 30 Minutes Intravenous Every 8 hours 01/05/20 1219 01/07/20 1747   01/03/2020 2200  ceFEPIme (MAXIPIME) 2 g in sodium chloride 0.9 % 100 mL IVPB  Status:  Discontinued        2 g 200 mL/hr over 30 Minutes Intravenous Every 12 hours 01/14/2020 1914 01/05/20 1219   01/19/2020 1530  cefTRIAXone (ROCEPHIN) 1 g in sodium chloride 0.9 % 100 mL IVPB  Status:  Discontinued        1 g 200 mL/hr over 30 Minutes Intravenous  Once 01/14/2020 1524 01/11/2020 1529   12/27/2019 1530  azithromycin (ZITHROMAX) 500 mg in sodium chloride 0.9 % 250 mL IVPB  Status:  Discontinued        500 mg 250 mL/hr over 60 Minutes Intravenous  Once 01/20/2020 1524 01/10/2020 1529   01/13/2020 1415  ceFEPIme (MAXIPIME) 2 g in sodium chloride 0.9 % 100 mL IVPB  2 g 200 mL/hr over 30 Minutes Intravenous  Once 01/07/2020 1402 01/25/2020 1500   01/14/2020 1415  metroNIDAZOLE (FLAGYL) IVPB 500 mg        500 mg 100 mL/hr over 60 Minutes Intravenous  Once 01/21/2020 1402 01/18/2020 1602   01/17/2020 1415  vancomycin (VANCOCIN) IVPB 1000 mg/200 mL premix  Status:  Discontinued        1,000 mg 200 mL/hr over 60 Minutes Intravenous  Once 01/15/2020 1402 01/08/2020 1413   01/16/2020 1415  vancomycin (VANCOREADY) IVPB 1250 mg/250 mL        1,250 mg 166.7 mL/hr over 90 Minutes Intravenous   Once 01/21/2020 1413 01/11/2020 1807      Subjective:  Patient seen and examined at the bedside this morning.  Shortly bedside.  Overall he looked comfortable.  On morphine drip at 8 mg/h.  He was having shallow and irregular breathing pattern   Objective: Vitals:   01/09/20 1406 01/10/20 0448 01/10/20 1428 01/11/20 0014  BP: 100/65  (!) 88/43 (!) 83/55  Pulse: 63  69 71  Resp: 10   10  Temp: (!) 100.6 F (38.1 C)  97.9 F (36.6 C) 97.6 F (36.4 C)  TempSrc: Oral  Oral Axillary  SpO2: 95%  92% 92%  Weight:  67 kg    Height:        Intake/Output Summary (Last 24 hours) at 01/11/2020 0830 Last data filed at 01/11/2020 0000 Gross per 24 hour  Intake 0 ml  Output --  Net 0 ml   Filed Weights   01/06/20 0500 01/07/20 0441 01/10/20 0448  Weight: 68.9 kg 67.3 kg 67 kg    Examination:  General exam: Unresponsive Respiratory system: Shallow, irregular breathing Cardiovascular system: S1 & S2 heard Gastrointestinal system: Abdomen is nondistended, soft and nontender. Central nervous system: not Alert and oriented Skin: No rashes, lesions or ulcers,no icterus ,no pallor   Data Reviewed: I have personally reviewed following labs and imaging studies  CBC: Recent Labs  Lab 01/07/2020 1330 01/05/20 0025 01/06/20 1137 01/07/20 0246  WBC 7.7 4.6 9.9 15.3*  NEUTROABS 6.2 3.3 7.8* 11.8*  HGB 10.0* 8.6* 8.9* 9.0*  HCT 32.8* 27.7* 28.1* 28.5*  MCV 98.5 97.2 96.6 96.3  PLT 106* 82* 77* 66*   Basic Metabolic Panel: Recent Labs  Lab 01/23/2020 1330 01/05/20 0025 01/06/20 1137 01/07/20 0246  NA 143 141 148* 147*  K 4.8 4.0 3.0* 4.1  CL 109 110 114* 117*  CO2 20* 16* 18* 18*  GLUCOSE 131* 139* 133* 187*  BUN 51* 49* 46* 45*  CREATININE 1.11 1.03 1.07 1.07  CALCIUM 8.9 7.8* 8.1* 8.3*  MG  --   --  1.8 1.9   GFR: Estimated Creatinine Clearance: 59.1 mL/min (by C-G formula based on SCr of 1.07 mg/dL). Liver Function Tests: Recent Labs  Lab 01/06/2020 1330 01/06/20 1137  01/07/20 0246  AST 62* 38 33  ALT 62* 45* 37  ALKPHOS 212* 136* 145*  BILITOT 1.3* 1.9* 1.7*  PROT 6.9 5.9* 5.8*  ALBUMIN 2.3* 2.5* 2.4*   No results for input(s): LIPASE, AMYLASE in the last 168 hours. No results for input(s): AMMONIA in the last 168 hours. Coagulation Profile: Recent Labs  Lab 01/16/2020 1330  INR 1.3*   Cardiac Enzymes: No results for input(s): CKTOTAL, CKMB, CKMBINDEX, TROPONINI in the last 168 hours. BNP (last 3 results) No results for input(s): PROBNP in the last 8760 hours. HbA1C: No results for input(s): HGBA1C in the last 72 hours.  CBG: No results for input(s): GLUCAP in the last 168 hours. Lipid Profile: No results for input(s): CHOL, HDL, LDLCALC, TRIG, CHOLHDL, LDLDIRECT in the last 72 hours. Thyroid Function Tests: No results for input(s): TSH, T4TOTAL, FREET4, T3FREE, THYROIDAB in the last 72 hours. Anemia Panel: No results for input(s): VITAMINB12, FOLATE, FERRITIN, TIBC, IRON, RETICCTPCT in the last 72 hours. Sepsis Labs: Recent Labs  Lab 01/07/2020 2244 01/05/20 0025 01/06/20 1137 01/07/20 0246  LATICACIDVEN 3.7* 3.0* 1.5 2.2*    Recent Results (from the past 240 hour(s))  Blood culture (routine x 2)     Status: None   Collection Time: 01/03/2020  1:29 PM   Specimen: BLOOD  Result Value Ref Range Status   Specimen Description   Final    BLOOD RIGHT ANTECUBITAL Performed at [facility] [address] [place], [place] [address]    Special Requests   Final    BOTTLES DRAWN AEROBIC AND ANAEROBIC Blood Culture adequate volume Performed at [facility] [address], [place], [place] [address]    Culture   Final    NO GROWTH 5 DAYS Performed at [facility], [place] [address], [place], [place] [address]    Report Status 01/09/2020 FINAL  Final  Blood culture (routine x 2)     Status: None   Collection Time: 01/01/2020  1:30 PM   Specimen: BLOOD RIGHT HAND  Result Value Ref Range Status    Specimen Description   Final    BLOOD RIGHT HAND Performed at [facility] [address] [place], [place] [address]    Special Requests   Final    BOTTLES DRAWN AEROBIC AND ANAEROBIC Blood Culture results may not be optimal due to an inadequate volume of blood received in culture bottles Performed at [facility] [address], [place], [place] [address]    Culture   Final    NO GROWTH 5 DAYS Performed at [facility], [place] [address] [place],  [address]    Report Status 01/09/2020 FINAL  Final  Respiratory Panel by RT PCR (Flu A&B, Covid) - Nasopharyngeal Swab     Status: None   Collection Time: 01/11/2020  2:11 PM   Specimen: Nasopharyngeal Swab  Result Value Ref Range Status   SARS Coronavirus 2 by RT PCR NEGATIVE NEGATIVE Final    Comment: (NOTE) SARS-CoV-2 target nucleic acids are NOT DETECTED.  The SARS-CoV-2 RNA is generally detectable in upper respiratoy specimens during the acute phase of infection. The lowest concentration of SARS-CoV-2 viral copies this assay can detect is 131 copies/mL. A negative result does not preclude SARS-Cov-2 infection and should not be used as the sole basis for treatment or other patient management decisions. A negative result may occur with  improper specimen collection/handling, submission of specimen other than nasopharyngeal swab, presence of viral mutation(s) within the areas targeted by this assay, and inadequate number of viral copies (<131 copies/mL). A negative result must be combined with clinical observations, patient history, and epidemiological information. The expected result is Negative.  Fact Sheet for Patients:  PinkCheek.be  Fact Sheet for Healthcare Providers:  GravelBags.it  This test is no t yet approved or cleared by the [place] FDA and  has been authorized for detection and/or diagnosis of SARS-CoV-2  by FDA under an Emergency Use Authorization (EUA). This EUA will remain  in effect (meaning this test can be used) for the duration of the COVID-19 declaration under  Section 564(b)(1) of the Act, 21 U.S.C. section 360bbb-3(b)(1), unless the authorization is terminated or revoked sooner.     Influenza A by PCR NEGATIVE NEGATIVE Final   Influenza B by PCR NEGATIVE NEGATIVE Final    Comment: (NOTE) The Xpert Xpress SARS-CoV-2/FLU/RSV assay is intended as an aid in  the diagnosis of influenza from Nasopharyngeal swab specimens and  should not be used as a sole basis for treatment. Nasal washings and  aspirates are unacceptable for Xpert Xpress SARS-CoV-2/FLU/RSV  testing.  Fact Sheet for Patients: PinkCheek.be  Fact Sheet for Healthcare Providers: GravelBags.it  This test is not yet approved or cleared by the [place] FDA and  has been authorized for detection and/or diagnosis of SARS-CoV-2 by  FDA under an Emergency Use Authorization (EUA). This EUA will remain  in effect (meaning this test can be used) for the duration of the  Covid-19 declaration under Section 564(b)(1) of the Act, 21  U.S.C. section 360bbb-3(b)(1), unless the authorization is  terminated or revoked. Performed at [facility], [place] [address] [place], [place] [address]   Urine culture     Status: None   Collection Time: 01/03/2020  2:54 PM   Specimen: In/Out Cath Urine  Result Value Ref Range Status   Specimen Description   Final    IN/OUT CATH URINE Performed at [facility] [address] [place], [place] [address]    Special Requests   Final    NONE Performed at [facility], [place] [address] [place], [place] [address]    Culture   Final    NO GROWTH Performed at [facility], [place] [address], [place], [place] [address]    Report Status 01/06/2020 FINAL  Final  Body fluid  culture (includes gram stain)     Status: None   Collection Time: 01/24/2020  7:17 PM   Specimen: Pleural Fluid  Result Value Ref Range Status   Specimen Description   Final    PLEURAL Performed at [facility] [address], [place], [place] [address]    Special Requests   Final    NONE Performed at [facility], [place] [address] [place], [place] [address]    Gram Stain   Final    FEW WBC PRESENT,BOTH PMN AND MONONUCLEAR NO ORGANISMS SEEN    Culture   Final    NO GROWTH Performed at [facility], [place] [address], [place], [place] [address]    Report Status 01/08/2020 FINAL  Final  MRSA PCR Screening     Status: None   Collection Time: 01/19/2020  8:51 PM   Specimen: Nasopharyngeal  Result Value Ref Range Status   MRSA by PCR NEGATIVE NEGATIVE Final    Comment:        The GeneXpert MRSA Assay (FDA approved for NASAL specimens only), is one component of a comprehensive MRSA colonization surveillance program. It is not intended to diagnose MRSA infection nor to guide or monitor treatment for MRSA infections. Performed at [facility], [place] [address] [place], [place] [address]   C Difficile Quick Screen w PCR reflex     Status: None   Collection Time: 01/06/20  5:45 PM   Specimen: STOOL  Result Value Ref Range Status   C Diff antigen NEGATIVE NEGATIVE Final   C Diff toxin NEGATIVE NEGATIVE Final   C Diff interpretation No C. difficile detected.  Final    Comment: Performed  at [facility], [place] [address] [place], [place] [address]         Radiology Studies: No results found.      Scheduled Meds: . chlorhexidine  15 mL Mouth Rinse BID  . Chlorhexidine Gluconate Cloth  6 each Topical Daily  . lip balm   Topical BID  . mouth rinse  15 mL Mouth Rinse q12n4p   Continuous Infusions: . chlorproMAZINE (THORAZINE) IV    . morphine 8 mg/hr (01/11/20 0513)     LOS: 7 days    Time  spent:15 mins.More than 50% of that time was spent in counseling and/or coordination of care.      Shelly Coss, MD [facility] P11/16/2021, 8:30 AM

## 2020-01-11 NOTE — Progress Notes (Signed)
Shift summary- Family has remained at the bedside, pt has maintained the open mouth breathing all shift having more frequent episodes of apnea and shallow breaths. Deep oral secretions barely cleared with yankauer but not excessive enough to need robinul. Morphine drip increased to 8mg  for a rapid HR & need for oral suctioning.

## 2020-01-11 NOTE — Progress Notes (Signed)
Daily Progress Note   Patient Name: Jerry Wong       Date: 01/11/2020 DOB: [birth year]  Age: 74 y.o. MRN#: [id number] Attending Physician: Shelly Coss, MD Primary Care Physician: Pleas Koch, NP Admit Date: 01/14/2020  Reason for Consultation/Follow-up: Terminal Care  Subjective: I saw and examined Mr. Grayer.  He is actively dying.  Appears comfortable on exam.    His son and daughter in law were present at the bedside.  They report that he has been comfortable today.  Provided anticipatory guidance regarding end of life.  Length of Stay: 7  Current Medications: Scheduled Meds:  . chlorhexidine  15 mL Mouth Rinse BID  . Chlorhexidine Gluconate Cloth  6 each Topical Daily  . lip balm   Topical BID  . mouth rinse  15 mL Mouth Rinse q12n4p    Continuous Infusions: . chlorproMAZINE (THORAZINE) IV    . morphine 8 mg/hr (01/11/20 1422)    PRN Meds: acetaminophen **OR** acetaminophen, albuterol, chlorproMAZINE (THORAZINE) IV, docusate sodium, glycopyrrolate **OR** glycopyrrolate **OR** glycopyrrolate, haloperidol **OR** haloperidol **OR** haloperidol lactate, ipratropium-albuterol, LORazepam **OR** LORazepam **OR** LORazepam, LORazepam, morphine injection, ondansetron **OR** ondansetron (ZOFRAN) IV, polyethylene glycol, polyvinyl alcohol  Physical Exam         Unresponsive Shallow breathing Has edema Abdomen not distended Apneic pauses Eyes open fixed stare upwards  Vital Signs: BP (!) 58/45   Pulse (!) 110   Temp 98.5 F (36.9 C)   Resp 12   Ht 5\' 9"  (1.753 m)   Wt 67 kg   SpO2 92%   BMI 21.81 kg/m  SpO2: SpO2: 92 % O2 Device: O2 Device: Nasal Cannula O2 Flow Rate: O2 Flow Rate (L/min): 4 L/min  Intake/output summary:   Intake/Output Summary (Last 24  hours) at 01/11/2020 1607 Last data filed at 01/11/2020 0000 Gross per 24 hour  Intake 0 ml  Output --  Net 0 ml   LBM: Last BM Date: 01/09/20 Baseline Weight: Weight: 68 kg Most recent weight: Weight: 67 kg      PPS 10% Palliative Assessment/Data:      Patient Active Problem List   Diagnosis Date Noted  . Palliative care by specialist   . General weakness   . Dying care   . Pleural effusion   . Malnutrition of moderate degree 01/05/2020  .  Pressure injury of skin 01/14/2020  . Antineoplastic chemotherapy induced anemia 11/30/2019  . Hypokalemia 10/13/2019  . Decreased appetite 10/13/2019  . Chronic respiratory failure with hypoxia ([place]) 09/21/2019  . Non-small cell carcinoma of right lung, stage 4 ([place]) 09/14/2019  . Encounter for antineoplastic chemotherapy 09/14/2019  . Encounter for antineoplastic immunotherapy 09/14/2019  . Goals of care, counseling/discussion 09/14/2019  . Postobstructive pneumonia 09/09/2019  . Sepsis ([place]) 09/03/2019  . Acute kidney injury superimposed on chronic kidney disease ([place]) 09/03/2019  . Acute on chronic respiratory failure with hypoxia ([place]) 09/03/2019  . Cough 09/02/2019  . Decreased renal function 01/15/2019  . Chronic tension-type headache, not intractable 10/26/2018  . Edema of both ankles 10/26/2018  . Insomnia secondary to chronic pain 12/29/2017  . Prostate cancer ([place]) 12/10/2016  . Prediabetes 12/10/2016  . Preventative health care 12/10/2016  . Essential hypertension 06/03/2016  . Hyperlipidemia 06/03/2016  . Medication monitoring encounter 08/24/2013  . Chronic headaches 08/21/2012  . Chronic neck and back pain 08/21/2012    Palliative Care Assessment & Plan   Patient Profile:    Assessment:  comfort measures Actively dying Non small cell lung cancer   Recommendations/Plan:  Full comfort measures.  Comfortable on my exam today.  This will be a hospital death.  Prognosis hours to some very limited  number of days.   Goals of Care and Additional Recommendations:  Limitations on Scope of Treatment: Full Comfort Care  Code Status:    Code Status Orders  (From admission, onward)         Start     Ordered   01/07/20 1751  Do not attempt resuscitation (DNR)  Continuous       Question Answer Comment  In the event of cardiac or respiratory ARREST Do not call a "code blue"   In the event of cardiac or respiratory ARREST Do not perform Intubation, CPR, defibrillation or ACLS   In the event of cardiac or respiratory ARREST Use medication by any route, position, wound care, and other measures to relive pain and suffering. May use oxygen, suction and manual treatment of airway obstruction as needed for comfort.      01/07/20 1750        Code Status History    Date Active Date Inactive Code Status Order ID Comments User Context   12/31/2019 1843 01/07/2020 1750 DNR [id number]  Laurin Coder, MD ED   01/19/2020 1816 01/24/2020 1843 DNR [id number]  Lacretia Leigh, MD ED   09/03/2019 0728 09/09/2019 2141 Full Code [id number]  Norval Morton, MD ED   Advance Care Planning Activity    Advance Directive Documentation     Most Recent Value  Type of Advance Directive Healthcare Power of [place], Living will  Pre-existing out of facility DNR order (yellow form or pink MOST form) --  "MOST" Form in Place? --       Prognosis:   Hours - Days  Discharge Planning:  Anticipated Hospital Death  Care plan was discussed with [facility] MD, son and daughter-in-law holding vigil at the bedside.   Thank you for allowing the Palliative Medicine Team to assist in the care of this patient.   Time In: 1230 Time Out: 1250 Total Time 20 Prolonged Time Billed No    Greater than 50%  of this time was spent counseling and coordinating care related to the above assessment and plan.  Micheline Rough, MD  Please contact Palliative Medicine Team phone at [phone] for questions and concerns.

## 2020-01-18 ENCOUNTER — Ambulatory Visit: Payer: Medicare Other | Admitting: Physician Assistant

## 2020-01-18 ENCOUNTER — Ambulatory Visit: Payer: Medicare Other

## 2020-01-18 ENCOUNTER — Other Ambulatory Visit: Payer: Medicare Other

## 2020-01-19 ENCOUNTER — Other Ambulatory Visit: Payer: Self-pay | Admitting: Internal Medicine

## 2020-01-21 ENCOUNTER — Ambulatory Visit: Payer: Medicare Other

## 2020-01-25 ENCOUNTER — Other Ambulatory Visit: Payer: Medicare Other

## 2020-01-26 NOTE — Progress Notes (Signed)
Pt appears to be resting comfortabley, wife, son & dtr in law all at bedside. Morphine drip remains at 8mg 

## 2020-01-26 NOTE — Death Summary Note (Signed)
Death Summary  Jerry Wong GYI:[id number] DOB: 1946/01/22 DOA: 2020/01/08  PCP: Pleas Koch, NP  Admit date: January 08, 2020 Date of Death: 01/16/2020 Time of Death: 0900   History of present illness:  Patient is a 74 year old male with history of stage IV non-small cell lung cancer who presented with large hilar mass,mediastinal  lymphadenopathy, bilateral adrenal metastasis, bony metastatic disease presented with shortness of breath. There was also complained of altered mentation at home.  On presentation, he was found to have large pleural effusion on the left side, started on BiPAP.  Palliative care consulted for goals of care due to his poor prognosis.    His care was transitioned to comfort.  He died peacefully today at 64 AM.  Following problems were addressed during his hospitalization:  Acute hypoxic respiratory failure: Due to combination of pneumonia, pleural effusion, metastatic lung cancer.  Status post thoracentesis for large left-sided pleural effusion x2.  Required BiPAP on presentation.  Sepsis/pneumonia: Was on antibiotics before.  Suspected to be from aspiration.    Acute metabolic encephalopathy: Confusion on  presentation.   New onset A. fib: Initially on Cardizem drip and therapeutic Lovenox.   Stage IV lung cancer: Has large right hilar mass, mediastinal lymphadenopathy, bilateral adrenal metastasis, bony metastatic disease.  Diagnosed in thousand 21.  Was on second line chemotherapy.  Oncology were following.  Anemia/thrombocytopenia: In the setting of malignancy.   Goals of care: Advanced malignancy with multiple comorbidities.  Care transitioned to comfort.  Final Diagnoses:  1.  Acute hypoxic respiratory failure secondary to pneumonia, pleural effusion   The results of significant diagnostics from this hospitalization (including imaging, microbiology, ancillary and laboratory) are listed below for reference.    Significant Diagnostic Studies: CT Head  Wo Contrast  Result Date: 01/08/20 CLINICAL DATA:  Altered mental status.  Lung carcinoma EXAM: CT HEAD WITHOUT CONTRAST TECHNIQUE: Contiguous axial images were obtained from the base of the skull through the vertex without intravenous contrast. COMPARISON:  None. FINDINGS: Brain: There is mild age related volume loss. There is no intracranial mass, hemorrhage, extra-axial fluid collection, or midline shift. There is mild small vessel disease in the centra semiovale bilaterally. Elsewhere brain parenchyma appears unremarkable. No evident acute infarct. Vascular: No hyperdense vessel. There is calcification in each carotid siphon region. Skull: Bony calvarium appears intact. Sinuses/Orbits: Paranasal sinuses are clear. Orbits appear symmetric bilaterally. Other: Mastoid air cells are clear. IMPRESSION: Age related volume loss with slight periventricular small vessel disease. No mass or hemorrhage. No acute infarct. Foci of arterial vascular calcification noted. Electronically Signed   By: Lowella Grip III M.D.   On: Jan 08, 2020 17:01   CT Chest W Contrast  Result Date: 01-08-2020 CLINICAL DATA:  74 year old male with abdominal pain. Stage IV lung cancer. EXAM: CT CHEST, ABDOMEN, AND PELVIS WITH CONTRAST TECHNIQUE: Multidetector CT imaging of the chest, abdomen and pelvis was performed following the standard protocol during bolus administration of intravenous contrast. CONTRAST:  110mL OMNIPAQUE IOHEXOL 300 MG/ML  SOLN COMPARISON:  CT of the chest abdomen pelvis dated 12/17/2019. FINDINGS: Evaluation of this exam is limited due to respiratory motion artifact. CT CHEST FINDINGS Cardiovascular: There is mild cardiomegaly. No pericardial effusion. There is retrograde flow of contrast from the right atrium into the IVC suggestive of right heart dysfunction. There is mild atherosclerotic calcification of thoracic aorta. No aneurysmal dilatation or dissection. The origins of the great vessels of the aortic arch  appear patent as visualized. The central pulmonary arteries appear patent. Mediastinum/Nodes: Interval  decrease in the size of the lymph nodes in the prevascular space compared to prior CT. Slightly decreased in the size of the lymph node anterior to the carina measuring approximately 8 mm in short axis (previously 11 mm). Decrease in the size of level IV lymph nodes compared to the prior CT measuring up to 11 mm in short axis on the right (8/3) previously measuring 14 mm. The esophagus and the thyroid gland are grossly unremarkable. No mediastinal fluid collection. Lungs/Pleura: There is a moderate size right pleural effusion, increased in size since the prior CT. There is associated compressive atelectasis of the majority of the right lower lobe, although pneumonia is not excluded. There is a 7.6 x 5.7 cm (previously 4.8 x 3.3) mass in the right perihilar region along the major fissure. Patchy ground-glass density and diffuse interstitial thickening with associated air bronchogram in the right upper and right middle lobes is new since the prior CT and may represent pneumonia or further extension of tumor. Clinical correlation is recommended. No pneumothorax. There is mass effect and compression of the bronchus intermedius and right middle and right lower lobe bronchi. There is complete occlusion of the right lower lobe bronchus just after bifurcation of the bronchus intermedius. Musculoskeletal: There is a mildly enlarged lymph node in the left axilla measuring 11 mm in short axis. There is a lytic lesion involving the left second rib with associated pathologic fracture also seen on the prior CT. There is a lytic lesion involving the right humeral head, present on the prior CT. Multiple additional lytic lesions involving the upper sternum, C7, T1, and T2 relatively similar to prior CT. CT ABDOMEN PELVIS FINDINGS No intra-abdominal free air or free fluid. Hepatobiliary: The liver and gallbladder appear  unremarkable. Pancreas: Unremarkable. No pancreatic ductal dilatation or surrounding inflammatory changes. Spleen: Normal in size without focal abnormality. Adrenals/Urinary Tract: Bilateral adrenal masses measuring approximately 4.6 x 4.1 cm on the left (previously 4.9 x 3.9 cm) and 2.4 x 1.9 cm on the right (previously 2.9 x 2.0 cm). There is mild bilateral renal parenchyma atrophy. Multiple bilateral renal cysts and smaller hypodense lesions which are too small to characterize. There is no hydronephrosis on either side. There is symmetric enhancement of the renal parenchyma. The visualized ureters and urinary bladder appear unremarkable. Stomach/Bowel: There are scattered sigmoid diverticula without active inflammatory changes. There is no bowel obstruction or active inflammation. The appendix is normal. Vascular/Lymphatic: Moderate aortoiliac atherosclerotic disease. The IVC is unremarkable. No portal venous gas. Left para-aortic/retroperitoneal adenopathy measuring 1.6 cm in short axis (previously 1.9 cm). Reproductive: The prostate is not visualized. Other: Midline vertical anterior pelvic wall incisional scar. Musculoskeletal: Multiple osseous lytic lesions relatively similar to prior CT. No acute osseous pathology. IMPRESSION: 1. Interval increase in the size of the right perihilar mass compared to the prior CT. There is associated mass effect and compression of the bronchus intermedius and right middle and right lower lobe bronchi. There is complete occlusion of the right lower lobe bronchus just after bifurcation of the bronchus intermedius. 2. Patchy area of ground-glass and interstitial density in the right upper and right middle lobe, new since the prior CT. This may represent pneumonia or tumoral extension. 3. Overall interval decrease in the cervical, mediastinal, and retroperitoneal nodal disease as well as decrease in the size of the adrenal metastasis. 4. Extensive osseous metastases relatively  similar to prior CT. 5. Interval increase in the size of the right pleural effusion with associated compressive atelectasis of the  majority of the right lower lobe. 6. No bowel obstruction. Normal appendix. 7. Aortic Atherosclerosis (ICD10-I70.0). Electronically Signed   By: Anner Crete M.D.   On: 01/20/2020 17:22   CT Chest W Contrast  Result Date: 12/18/2019 CLINICAL DATA:  Restaging non-small cell lung cancer. Status post chemotherapy and XRT. EXAM: CT CHEST, ABDOMEN, AND PELVIS WITH CONTRAST TECHNIQUE: Multidetector CT imaging of the chest, abdomen and pelvis was performed following the standard protocol during bolus administration of intravenous contrast. CONTRAST:  134mL OMNIPAQUE IOHEXOL 300 MG/ML  SOLN COMPARISON:  PET-CT 09/17/2019 FINDINGS: CT CHEST FINDINGS Cardiovascular: The heart is upper limits of normal in size. No significant pericardial effusion. Aortic atherosclerosis. Coronary artery calcification identified. Mediastinum/Nodes: Normal appearance of the thyroid gland. The trachea appears patent and is midline. Normal appearance of the esophagus. Progression of thoracic adenopathy: Left supraclavicular lymph node measures 1.4 cm, image 11/2. New from previous exam. Right supraclavicular lymph node measures 1.3 cm, image 12/2. Also new from previous exam. Multiple enlarged superior mediastinal and prevascular lymph nodes: Index left pre-vascular lymph node measures 1.1 cm, image 26/2. New from previous exam. Right paratracheal lymph node measures 1 cm, image 19/2. Also new from previous exam. Previously referenced enlarged subcarinal lymph node measures 0.8 cm, image 39/2. On the prior exam this measured 2.3 cm. Conglomeration of enlarged right hilar lymph nodes measured 2.2 x 1.5 cm on today's exam, image 37/2. Previously 3.8 x 3.1 cm. Lungs/Pleura: New right pleural effusion. The right lower lobe lung mass has decreased in size from previous exam. On today's study this measures 3.2 by  3.7 by 4.1 cm, image 48/2 and image 86/4. On the previous exam using the same measurements scheme this measured 5.5 x 7.7 x 6.5 cm Signs of lymphangitic spread of tumor is again noted within the right lower lobe with interlobular septal thickening and nodularity. Musculoskeletal: Lytic lesion involving the anterolateral aspect of the left second rib measures 1.6 cm. On the previous PET-CT there is a focal area of hypermetabolism corresponding to this lesion without changes on the corresponding CT images. Similarly, there are multiple lytic bone lesions within the thoracic spine which roughly correlate 2 areas of hypermetabolism on previous PET-CT as seen on image 98/5 CT ABDOMEN PELVIS FINDINGS Hepatobiliary: No suspicious subtle, nonspecific area of low attenuation within inferior right hepatic lobe measures 8 mm. This was not confidently noted on previous imaging. Gallbladder unremarkable. Pancreas: Unremarkable. No pancreatic ductal dilatation or surrounding inflammatory changes. Spleen: Normal in size without focal abnormality. 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 Previously 3.3 x 2.1 cm. New right adrenal gland metastasis measures 2.9 x 2.0 cm, image 65/2.Bilateral kidney cysts. No mass or hydronephrosis. Urinary bladder is unremarkable Stomach/Bowel: Stomach is within normal limits. Appendix appears normal. No evidence of bowel wall thickening, distention, or inflammatory changes. Vascular/Lymphatic: Aortic atherosclerosis. No aneurysm. New left retroperitoneal lymph node measures 3.2 x 1.9 cm, image 77/2. No pelvic or inguinal adenopathy. Reproductive: Signs of previous prostatectomy. Other: No free fluid or fluid collections. Musculoskeletal: Multifocal lytic bone metastases are identified. These are new when compared with the previous imaging including 1 cm lesion within the L2 vertebra as well as a 1.4 cm lesion within L3. Multiple bilateral lytic lesions  are noted involving the iliac bone which by CT are new. New lucent lesions are identified within bilateral proximal femurs. IMPRESSION: 1. Mixed response to therapy. Interval improvement in right lower lobe lung mass as well as right hilar  and subcarinal adenopathy. Interval development of bilateral mediastinal and supraclavicular adenopathy. There is also new left retroperitoneal adenopathy. 2. 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 New right adrenal gland metastasis. 3. 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 By CT these are new when compared with previous PET-CT from 09/17/2019. 4. New right pleural effusion. 5. Coronary artery calcification. 6. Aortic atherosclerosis. Aortic Atherosclerosis (ICD10-I70.0). Electronically Signed   By: Kerby Moors M.D.   On: 12/18/2019 12:36   CT ABDOMEN PELVIS W CONTRAST  Result Date: 01/07/2020 CLINICAL DATA:  74 year old male with abdominal pain. Stage IV lung cancer. EXAM: CT CHEST, ABDOMEN, AND PELVIS WITH CONTRAST TECHNIQUE: Multidetector CT imaging of the chest, abdomen and pelvis was performed following the standard protocol during bolus administration of intravenous contrast. CONTRAST:  134mL OMNIPAQUE IOHEXOL 300 MG/ML  SOLN COMPARISON:  CT of the chest abdomen pelvis dated 12/17/2019. FINDINGS: Evaluation of this exam is limited due to respiratory motion artifact. CT CHEST FINDINGS Cardiovascular: There is mild cardiomegaly. No pericardial effusion. There is retrograde flow of contrast from the right atrium into the IVC suggestive of right heart dysfunction. There is mild atherosclerotic calcification of thoracic aorta. No aneurysmal dilatation or dissection. The origins of the great vessels of the aortic arch appear patent as visualized. The central pulmonary arteries appear patent. Mediastinum/Nodes: Interval decrease in the size of the lymph nodes in the prevascular space compared to prior CT. Slightly decreased in the size of the  lymph node anterior to the carina measuring approximately 8 mm in short axis (previously 11 mm). Decrease in the size of level IV lymph nodes compared to the prior CT measuring up to 11 mm in short axis on the right (8/3) previously measuring 14 mm. The esophagus and the thyroid gland are grossly unremarkable. No mediastinal fluid collection. Lungs/Pleura: There is a moderate size right pleural effusion, increased in size since the prior CT. There is associated compressive atelectasis of the majority of the right lower lobe, although pneumonia is not excluded. There is a 7.6 x 5.7 cm (previously 4.8 x 3.3) mass in the right perihilar region along the major fissure. Patchy ground-glass density and diffuse interstitial thickening with associated air bronchogram in the right upper and right middle lobes is new since the prior CT and may represent pneumonia or further extension of tumor. Clinical correlation is recommended. No pneumothorax. There is mass effect and compression of the bronchus intermedius and right middle and right lower lobe bronchi. There is complete occlusion of the right lower lobe bronchus just after bifurcation of the bronchus intermedius. Musculoskeletal: There is a mildly enlarged lymph node in the left axilla measuring 11 mm in short axis. There is a lytic lesion involving the left second rib with associated pathologic fracture also seen on the prior CT. There is a lytic lesion involving the right humeral head, present on the prior CT. Multiple additional lytic lesions involving the upper sternum, C7, T1, and T2 relatively similar to prior CT. CT ABDOMEN PELVIS FINDINGS No intra-abdominal free air or free fluid. Hepatobiliary: The liver and gallbladder appear unremarkable. Pancreas: Unremarkable. No pancreatic ductal dilatation or surrounding inflammatory changes. Spleen: Normal in size without focal abnormality. Adrenals/Urinary Tract: Bilateral adrenal masses measuring approximately 4.6 x 4.1  cm on the left (previously 4.9 x 3.9 cm) and 2.4 x 1.9 cm on the right (previously 2.9 x 2.0 cm). There is mild bilateral renal parenchyma atrophy. Multiple bilateral renal cysts and smaller hypodense lesions which are too small to  characterize. There is no hydronephrosis on either side. There is symmetric enhancement of the renal parenchyma. The visualized ureters and urinary bladder appear unremarkable. Stomach/Bowel: There are scattered sigmoid diverticula without active inflammatory changes. There is no bowel obstruction or active inflammation. The appendix is normal. Vascular/Lymphatic: Moderate aortoiliac atherosclerotic disease. The IVC is unremarkable. No portal venous gas. Left para-aortic/retroperitoneal adenopathy measuring 1.6 cm in short axis (previously 1.9 cm). Reproductive: The prostate is not visualized. Other: Midline vertical anterior pelvic wall incisional scar. Musculoskeletal: Multiple osseous lytic lesions relatively similar to prior CT. No acute osseous pathology. IMPRESSION: 1. Interval increase in the size of the right perihilar mass compared to the prior CT. There is associated mass effect and compression of the bronchus intermedius and right middle and right lower lobe bronchi. There is complete occlusion of the right lower lobe bronchus just after bifurcation of the bronchus intermedius. 2. Patchy area of ground-glass and interstitial density in the right upper and right middle lobe, new since the prior CT. This may represent pneumonia or tumoral extension. 3. Overall interval decrease in the cervical, mediastinal, and retroperitoneal nodal disease as well as decrease in the size of the adrenal metastasis. 4. Extensive osseous metastases relatively similar to prior CT. 5. Interval increase in the size of the right pleural effusion with associated compressive atelectasis of the majority of the right lower lobe. 6. No bowel obstruction. Normal appendix. 7. Aortic Atherosclerosis  (ICD10-I70.0). Electronically Signed   By: Anner Crete M.D.   On: 12/28/2019 17:22   CT Abdomen Pelvis W Contrast  Result Date: 12/18/2019 CLINICAL DATA:  Restaging non-small cell lung cancer. Status post chemotherapy and XRT. EXAM: CT CHEST, ABDOMEN, AND PELVIS WITH CONTRAST TECHNIQUE: Multidetector CT imaging of the chest, abdomen and pelvis was performed following the standard protocol during bolus administration of intravenous contrast. CONTRAST:  124mL OMNIPAQUE IOHEXOL 300 MG/ML  SOLN COMPARISON:  PET-CT 09/17/2019 FINDINGS: CT CHEST FINDINGS Cardiovascular: The heart is upper limits of normal in size. No significant pericardial effusion. Aortic atherosclerosis. Coronary artery calcification identified. Mediastinum/Nodes: Normal appearance of the thyroid gland. The trachea appears patent and is midline. Normal appearance of the esophagus. Progression of thoracic adenopathy: Left supraclavicular lymph node measures 1.4 cm, image 11/2. New from previous exam. Right supraclavicular lymph node measures 1.3 cm, image 12/2. Also new from previous exam. Multiple enlarged superior mediastinal and prevascular lymph nodes: Index left pre-vascular lymph node measures 1.1 cm, image 26/2. New from previous exam. Right paratracheal lymph node measures 1 cm, image 19/2. Also new from previous exam. Previously referenced enlarged subcarinal lymph node measures 0.8 cm, image 39/2. On the prior exam this measured 2.3 cm. Conglomeration of enlarged right hilar lymph nodes measured 2.2 x 1.5 cm on today's exam, image 37/2. Previously 3.8 x 3.1 cm. Lungs/Pleura: New right pleural effusion. The right lower lobe lung mass has decreased in size from previous exam. On today's study this measures 3.2 by 3.7 by 4.1 cm, image 48/2 and image 86/4. On the previous exam using the same measurements scheme this measured 5.5 x 7.7 x 6.5 cm Signs of lymphangitic spread of tumor is again noted within the right lower lobe with  interlobular septal thickening and nodularity. Musculoskeletal: Lytic lesion involving the anterolateral aspect of the left second rib measures 1.6 cm. On the previous PET-CT there is a focal area of hypermetabolism corresponding to this lesion without changes on the corresponding CT images. Similarly, there are multiple lytic bone lesions within the thoracic spine which roughly  correlate 2 areas of hypermetabolism on previous PET-CT as seen on image 98/5 CT ABDOMEN PELVIS FINDINGS Hepatobiliary: No suspicious subtle, nonspecific area of low attenuation within inferior right hepatic lobe measures 8 mm. This was not confidently noted on previous imaging. Gallbladder unremarkable. Pancreas: Unremarkable. No pancreatic ductal dilatation or surrounding inflammatory changes. Spleen: Normal in size without focal abnormality. 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 Previously 3.3 x 2.1 cm. New right adrenal gland metastasis measures 2.9 x 2.0 cm, image 65/2.Bilateral kidney cysts. No mass or hydronephrosis. Urinary bladder is unremarkable Stomach/Bowel: Stomach is within normal limits. Appendix appears normal. No evidence of bowel wall thickening, distention, or inflammatory changes. Vascular/Lymphatic: Aortic atherosclerosis. No aneurysm. New left retroperitoneal lymph node measures 3.2 x 1.9 cm, image 77/2. No pelvic or inguinal adenopathy. Reproductive: Signs of previous prostatectomy. Other: No free fluid or fluid collections. Musculoskeletal: Multifocal lytic bone metastases are identified. These are new when compared with the previous imaging including 1 cm lesion within the L2 vertebra as well as a 1.4 cm lesion within L3. Multiple bilateral lytic lesions are noted involving the iliac bone which by CT are new. New lucent lesions are identified within bilateral proximal femurs. IMPRESSION: 1. Mixed response to therapy. Interval improvement in right lower lobe lung mass  as well as right hilar and subcarinal adenopathy. Interval development of bilateral mediastinal and supraclavicular adenopathy. There is also new left retroperitoneal adenopathy. 2. 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 New right adrenal gland metastasis. 3. 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 By CT these are new when compared with previous PET-CT from 09/17/2019. 4. New right pleural effusion. 5. Coronary artery calcification. 6. Aortic atherosclerosis. Aortic Atherosclerosis (ICD10-I70.0). Electronically Signed   By: Kerby Moors M.D.   On: 12/18/2019 12:36   DG CHEST PORT 1 VIEW  Result Date: 01/06/2020 CLINICAL DATA:  Right thoracentesis EXAM: PORTABLE CHEST 1 VIEW COMPARISON:  01/17/2020 FINDINGS: Single frontal view of the chest demonstrates decreased right pleural effusion after thoracentesis. No evidence of pneumothorax. Persistent consolidation at the right lung base. Left chest is clear. IMPRESSION: 1. No complication after right thoracentesis. Decreased right pleural effusion. 2. Persistent right basilar consolidation consistent with known lung cancer. Electronically Signed   By: Randa Ngo M.D.   On: 01/06/2020 17:56   DG CHEST PORT 1 VIEW  Result Date: 01/24/2020 CLINICAL DATA:  Post thoracentesis EXAM: PORTABLE CHEST 1 VIEW COMPARISON:  Radiograph 11/04/2019 FINDINGS: Patient is post thoracentesis with a decrease in the overall volume of the pleural effusion accounting for differences in positioning from the comparison supine examination. No left effusion. No pneumothorax. Redemonstration of the right infrahilar mass, seen on comparison cross-sectional studies with surrounding heterogeneous opacity. Additional features suggest some mild-to-moderate interstitial edema as well with septal lines, vascular congestion on a background of cardiomegaly. Calcified aorta. No acute osseous or soft tissue abnormality. Degenerative changes are present in the imaged  spine and shoulders. Telemetry leads overlie the chest. IMPRESSION: 1. Decrease in the overall volume of the right pleural effusion status post thoracentesis. No pneumothorax. 2. Redemonstrated mass in the right infrahilar region with surrounding heterogeneous opacity. 3. Mild-to-moderate interstitial edema, vascular congestion and cardiomegaly, suggestive of CHF/volume overload as well. Electronically Signed   By: Lovena Le M.D.   On: 01/09/2020 19:32   DG Chest Port 1 View  Result Date: 12/29/2019 CLINICAL DATA:  Stage IV lung cancer on active treatment, sleeping more, slow to respond for 3-4  days, question sepsis EXAM: PORTABLE CHEST 1 VIEW COMPARISON:  Portable exam 1336 hours compared 10/15/2019 FINDINGS: Enlargement of cardiac silhouette with slight pulmonary vascular congestion. Mediastinal contours normal. Atherosclerotic calcification aorta. Persistent opacity at RIGHT middle lobe with increased RIGHT basilar infiltrate and small pleural effusion. LEFT lung clear. No pneumothorax or acute osseous findings. IMPRESSION: Chronic RIGHT middle lobe opacity with increased RIGHT pleural effusion and RIGHT lower lobe infiltrate. Electronically Signed   By: Lavonia Dana M.D.   On: 01/02/2020 13:58   VAS [place] LOWER EXTREMITY VENOUS (DVT)  Result Date: 01/08/2020  Lower Venous DVT Study Indications: Edema.  Risk Factors: Cancer. Limitations: Poor ultrasound/tissue interface. Comparison Study: No prior studies. Performing Technologist: Oliver Hum RVT  Examination Guidelines: A complete evaluation includes B-mode imaging, spectral Doppler, color Doppler, and power Doppler as needed of all accessible portions of each vessel. Bilateral testing is considered an integral part of a complete examination. Limited examinations for reoccurring indications may be performed as noted. The reflux portion of the exam is performed with the patient in reverse Trendelenburg.   +---------+---------------+---------+-----------+----------+-------------------+ RIGHT    CompressibilityPhasicitySpontaneityPropertiesThrombus Aging      +---------+---------------+---------+-----------+----------+-------------------+ CFV      Full           Yes      Yes                                      +---------+---------------+---------+-----------+----------+-------------------+ SFJ      Full                                                             +---------+---------------+---------+-----------+----------+-------------------+ FV Prox  Full                                                             +---------+---------------+---------+-----------+----------+-------------------+ FV Mid   Full                                                             +---------+---------------+---------+-----------+----------+-------------------+ FV DistalFull                                                             +---------+---------------+---------+-----------+----------+-------------------+ PFV      Full                                                             +---------+---------------+---------+-----------+----------+-------------------+ POP      Full  Yes      Yes                                      +---------+---------------+---------+-----------+----------+-------------------+ PTV      Full                                                             +---------+---------------+---------+-----------+----------+-------------------+ PERO                                                  Not well visualized +---------+---------------+---------+-----------+----------+-------------------+   +----+---------------+---------+-----------+----------+--------------+ LEFTCompressibilityPhasicitySpontaneityPropertiesThrombus Aging +----+---------------+---------+-----------+----------+--------------+ CFV Full           Yes       Yes                                 +----+---------------+---------+-----------+----------+--------------+     Summary: RIGHT: - There is no evidence of deep vein thrombosis in the lower extremity. However, portions of this examination were limited- see technologist comments above.  - No cystic structure found in the popliteal fossa.  LEFT: - No evidence of common femoral vein obstruction.  *See table(s) above for measurements and observations. Electronically signed by Ruta Hinds MD on 01/08/2020 at 11:48:52 AM.    Final     Microbiology: Recent Results (from the past 240 hour(s))  Blood culture (routine x 2)     Status: None   Collection Time: 01/22/2020  1:29 PM   Specimen: BLOOD  Result Value Ref Range Status   Specimen Description   Final    BLOOD RIGHT ANTECUBITAL Performed at [facility], [place] [address] [place], [place] [address]    Special Requests   Final    BOTTLES DRAWN AEROBIC AND ANAEROBIC Blood Culture adequate volume Performed at [place] [address], [place], [place] [address]    Culture   Final    NO GROWTH 5 DAYS Performed at [facility], [place] [address], [place], [place] [address]    Report Status 01/09/2020 FINAL  Final  Blood culture (routine x 2)     Status: None   Collection Time: 01/24/2020  1:30 PM   Specimen: BLOOD RIGHT HAND  Result Value Ref Range Status   Specimen Description   Final    BLOOD RIGHT HAND Performed at [place] [address], [place], [place] [address]    Special Requests   Final    BOTTLES DRAWN AEROBIC AND ANAEROBIC Blood Culture results may not be optimal due to an inadequate volume of blood received in culture bottles Performed at [place] [address], [place], [place] [address]    Culture   Final    NO GROWTH 5 DAYS Performed at [facility], [place] [address] [place], [place] [address]    Report Status 01/09/2020 FINAL   Final  Respiratory Panel by RT PCR (Flu A&B, Covid) - Nasopharyngeal Swab     Status: None   Collection Time:  01/06/2020  2:11 PM   Specimen: Nasopharyngeal Swab  Result Value Ref Range Status   SARS Coronavirus 2 by RT PCR NEGATIVE NEGATIVE Final    Comment: (NOTE) SARS-CoV-2 target nucleic acids are NOT DETECTED.  The SARS-CoV-2 RNA is generally detectable in upper respiratoy specimens during the acute phase of infection. The lowest concentration of SARS-CoV-2 viral copies this assay can detect is 131 copies/mL. A negative result does not preclude SARS-Cov-2 infection and should not be used as the sole basis for treatment or other patient management decisions. A negative result may occur with  improper specimen collection/handling, submission of specimen other than nasopharyngeal swab, presence of viral mutation(s) within the areas targeted by this assay, and inadequate number of viral copies (<131 copies/mL). A negative result must be combined with clinical observations, patient history, and epidemiological information. The expected result is Negative.  Fact Sheet for Patients:  PinkCheek.be  Fact Sheet for Healthcare Providers:  GravelBags.it  This test is no t yet approved or cleared by the [place] FDA and  has been authorized for detection and/or diagnosis of SARS-CoV-2 by FDA under an Emergency Use Authorization (EUA). This EUA will remain  in effect (meaning this test can be used) for the duration of the COVID-19 declaration under Section 564(b)(1) of the Act, 21 U.S.C. section 360bbb-3(b)(1), unless the authorization is terminated or revoked sooner.     Influenza A by PCR NEGATIVE NEGATIVE Final   Influenza B by PCR NEGATIVE NEGATIVE Final    Comment: (NOTE) The Xpert Xpress SARS-CoV-2/FLU/RSV assay is intended as an aid in  the diagnosis of influenza from Nasopharyngeal swab specimens and  should not be  used as a sole basis for treatment. Nasal washings and  aspirates are unacceptable for Xpert Xpress SARS-CoV-2/FLU/RSV  testing.  Fact Sheet for Patients: PinkCheek.be  Fact Sheet for Healthcare Providers: GravelBags.it  This test is not yet approved or cleared by the [place] FDA and  has been authorized for detection and/or diagnosis of SARS-CoV-2 by  FDA under an Emergency Use Authorization (EUA). This EUA will remain  in effect (meaning this test can be used) for the duration of the  Covid-19 declaration under Section 564(b)(1) of the Act, 21  U.S.C. section 360bbb-3(b)(1), unless the authorization is  terminated or revoked. Performed at [facility], [place] [address] [place], [place] [address]   Urine culture     Status: None   Collection Time: 01/20/2020  2:54 PM   Specimen: In/Out Cath Urine  Result Value Ref Range Status   Specimen Description   Final    IN/OUT CATH URINE Performed at [facility] [place] [address], [place], [place] [address]    Special Requests   Final    NONE Performed at [facility], [place] [address] [place], [place] [address]    Culture   Final    NO GROWTH Performed at [facility], [place] [address] [place], [place] [address]    Report Status 01/06/2020 FINAL  Final  Body fluid culture (includes gram stain)     Status: None   Collection Time: 01/07/2020  7:17 PM   Specimen: Pleural Fluid  Result Value Ref Range Status   Specimen Description   Final    PLEURAL Performed at [facility] [address] [place], [place] [address]    Special Requests   Final    NONE Performed at [facility], [place] [address] [place],  [place] [address]    Gram Stain   Final    FEW WBC PRESENT,BOTH PMN AND MONONUCLEAR NO ORGANISMS SEEN    Culture   Final    NO GROWTH Performed at [place], [address] [place], [place] [address]    Report Status 01/08/2020 FINAL  Final  MRSA PCR Screening     Status: None   Collection Time: 12/27/2019  8:51 PM   Specimen: Nasopharyngeal  Result Value Ref Range Status   MRSA by PCR NEGATIVE NEGATIVE Final    Comment:        The GeneXpert MRSA Assay (FDA approved for NASAL specimens only), is one component of a comprehensive MRSA colonization surveillance program. It is not intended to diagnose MRSA infection nor to guide or monitor treatment for MRSA infections. Performed at [facility], [place] [address], [place], [place] [address]   C Difficile Quick Screen w PCR reflex     Status: None   Collection Time: 01/06/20  5:45 PM   Specimen: STOOL  Result Value Ref Range Status   C Diff antigen NEGATIVE NEGATIVE Final   C Diff toxin NEGATIVE NEGATIVE Final   C Diff interpretation No C. difficile detected.  Final    Comment: Performed at [facility], [place] [address], [place], [place] [address]     Labs: Basic Metabolic Panel: Recent Labs  Lab 01/06/20 1137 01/07/20 0246  NA 148* 147*  K 3.0* 4.1  CL 114* 117*  CO2 18* 18*  GLUCOSE 133* 187*  BUN 46* 45*  CREATININE 1.07 1.07  CALCIUM 8.1* 8.3*  MG 1.8 1.9   Liver Function Tests: Recent Labs  Lab 01/06/20 1137 01/07/20 0246  AST 38 33  ALT 45* 37  ALKPHOS 136* 145*  BILITOT 1.9* 1.7*  PROT 5.9* 5.8*  ALBUMIN 2.5* 2.4*   No results for input(s): LIPASE, AMYLASE in the last 168 hours. No results for input(s): AMMONIA in the last 168 hours. CBC: Recent Labs  Lab 01/06/20 1137 01/07/20 0246  WBC 9.9 15.3*  NEUTROABS 7.8* 11.8*  HGB 8.9* 9.0*  HCT 28.1* 28.5*  MCV 96.6 96.3  PLT 77* 66*   Cardiac Enzymes: No results for input(s): CKTOTAL, CKMB, CKMBINDEX, TROPONINI in the last 168 hours. D-Dimer No results for input(s): DDIMER in the last 72 hours. BNP: Invalid input(s): POCBNP CBG: No results for input(s): GLUCAP in  the last 168 hours. Anemia work up No results for input(s): VITAMINB12, FOLATE, FERRITIN, TIBC, IRON, RETICCTPCT in the last 72 hours. Urinalysis    Component Value Date/Time   COLORURINE AMBER (A) 01/20/2020 1454   APPEARANCEUR HAZY (A) 01/20/2020 1454   LABSPEC 1.020 01/20/2020 1454   PHURINE 5.0 12/28/2019 1454   GLUCOSEU NEGATIVE 01/21/2020 1454   HGBUR MODERATE (A) 01/15/2020 1454   BILIRUBINUR NEGATIVE 01/03/2020 1454   KETONESUR NEGATIVE 12/30/2019 1454   PROTEINUR 30 (A) 01/01/2020 1454   NITRITE NEGATIVE 01/17/2020 1454   LEUKOCYTESUR NEGATIVE 01/11/2020 1454   Sepsis Labs Invalid input(s): PROCALCITONIN,  WBC,  LACTICIDVEN     SIGNED:  Shelly Coss, MD  [facility] 01/25/2020, 10:32 AM Pager [phone]  If 7PM-7AM, please contact night-coverage [email or web address] Password [email or web address]

## 2020-01-26 DEATH — deceased

## 2020-02-01 ENCOUNTER — Other Ambulatory Visit: Payer: Medicare Other

## 2020-02-01 ENCOUNTER — Ambulatory Visit: Payer: Medicare Other | Admitting: Internal Medicine

## 2020-02-01 ENCOUNTER — Ambulatory Visit: Payer: Medicare Other

## 2020-02-08 ENCOUNTER — Other Ambulatory Visit: Payer: Medicare Other

## 2020-02-08 ENCOUNTER — Ambulatory Visit: Payer: Medicare Other

## 2020-02-08 ENCOUNTER — Ambulatory Visit: Payer: Medicare Other | Admitting: Internal Medicine

## 2020-02-10 ENCOUNTER — Ambulatory Visit: Payer: Medicare Other

## 2020-02-15 ENCOUNTER — Other Ambulatory Visit: Payer: Medicare Other

## 2020-02-22 ENCOUNTER — Other Ambulatory Visit: Payer: Medicare Other

## 2021-02-27 ENCOUNTER — Telehealth: Payer: Self-pay | Admitting: Medical Oncology

## 2021-02-27 NOTE — Telephone Encounter (Signed)
FYI- Family called "What was my father's  diagnosis when he was under Mohamed's care"?   Pts son , Jerry Wong ,is asking because he was recently diagnosed with "calcified R Hilar lymph node ".  Release of information-Pt's wife was the only person on his release form and she is also deceased.  I told her I am not sure we can release that because pt's wife is only one on Release.

## 2022-02-15 IMAGING — PT NM PET TUM IMG INITIAL (PI) SKULL BASE T - THIGH
1 series · 12 of 14 positions shown · non-contrast
Comparison: CT chest September 02, 2019

CLINICAL DATA: Initial treatment strategy for non-small cell lung
cancer in a patient with history of prostate cancer.

EXAM:
NUCLEAR MEDICINE PET SKULL BASE TO THIGH
TECHNIQUE: 10.4 mCi F-18 FDG was injected intravenously. Full-ring PET imaging
was performed from the skull base to thigh after the radiotracer. CT
data was obtained and used for attenuation correction and anatomic
localization.
Fasting blood glucose: 111 mg/dl

[Series 1095: results mm oncology reading · 1.0mm · 0.50mm/px · 12 of 14 slices shown]
[im 1/14]
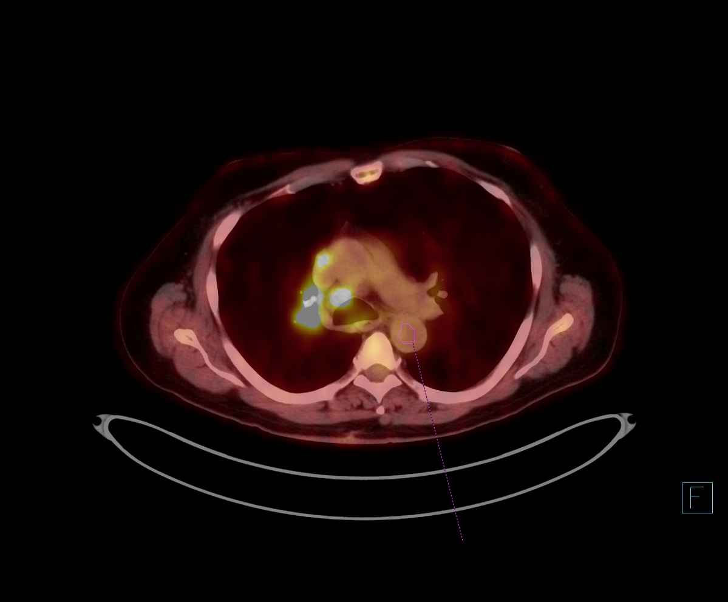
[im 2/14]
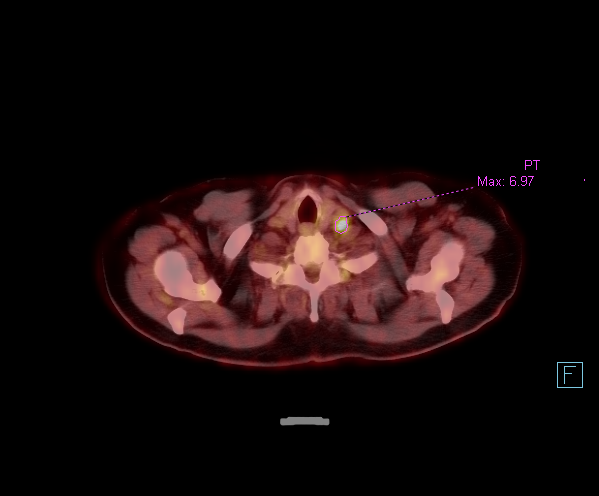
[im 3/14]
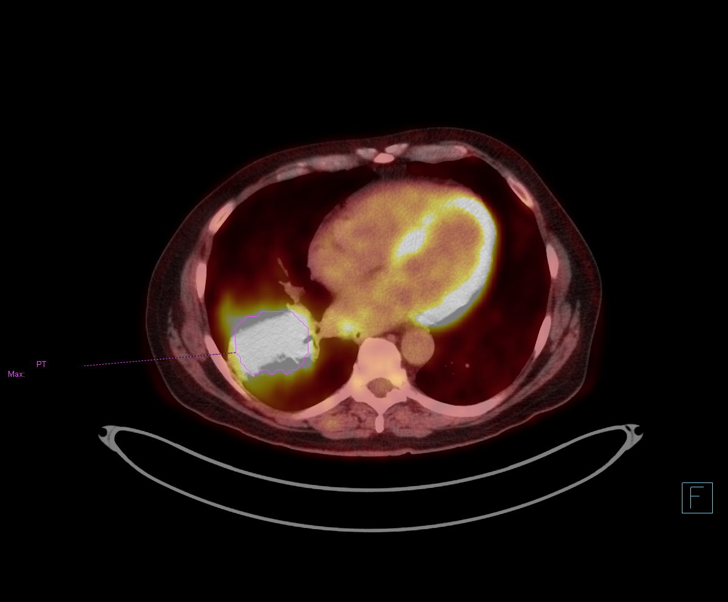
[im 5/14]
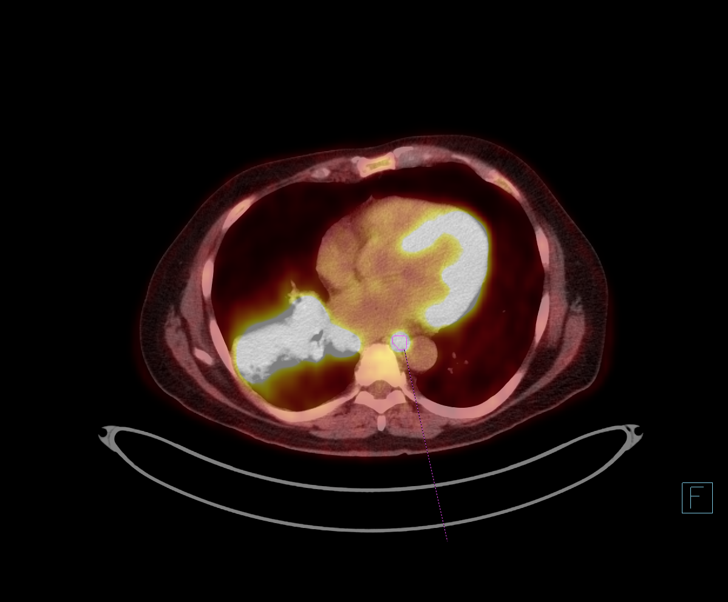
[im 6/14]
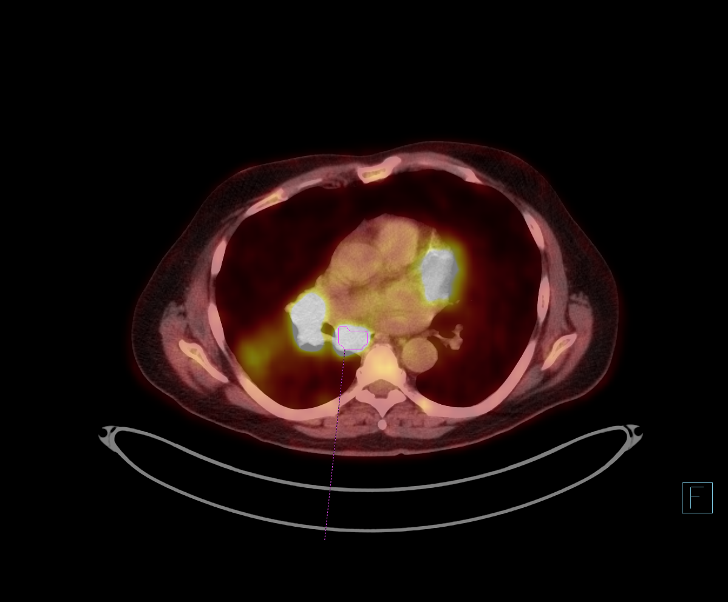
[im 7/14]
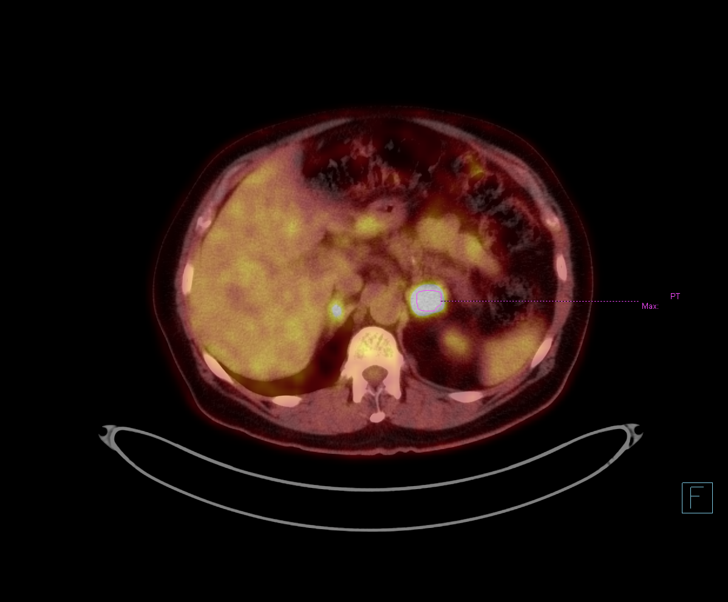
[im 8/14]
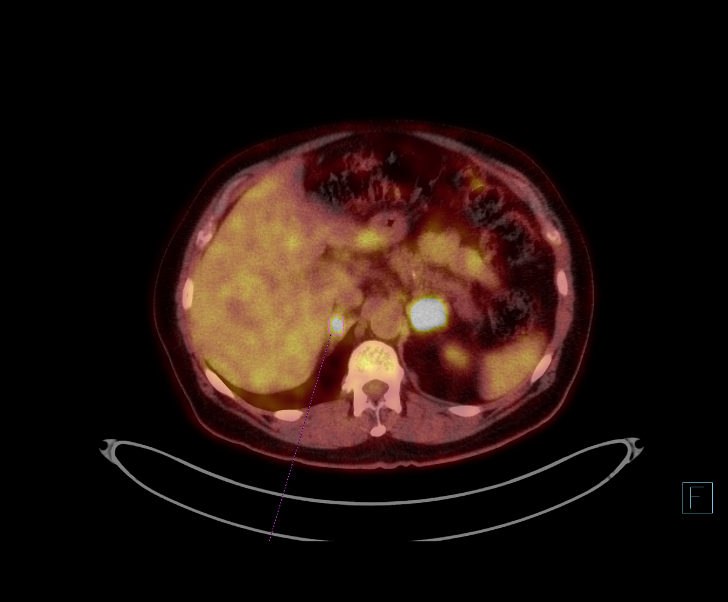
[im 9/14]
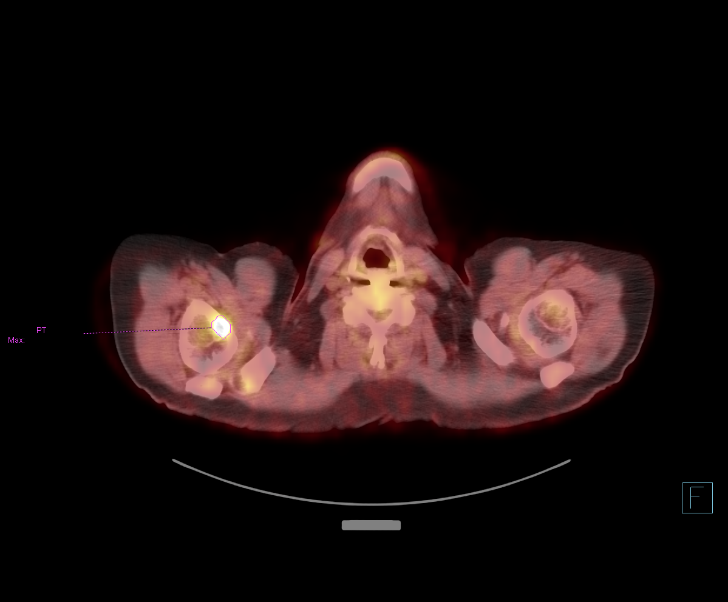
[im 10/14]
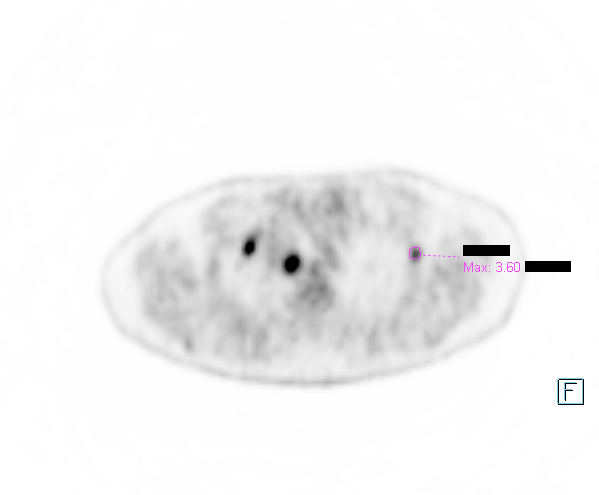
[im 12/14]
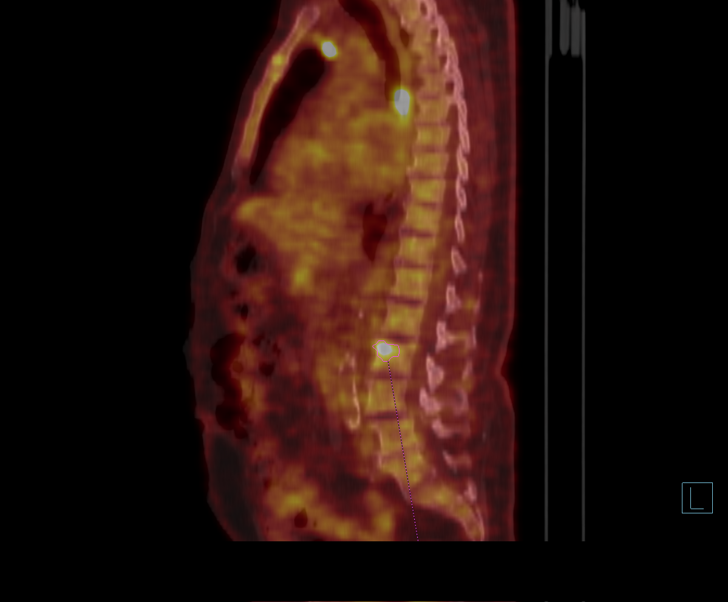
[im 13/14]
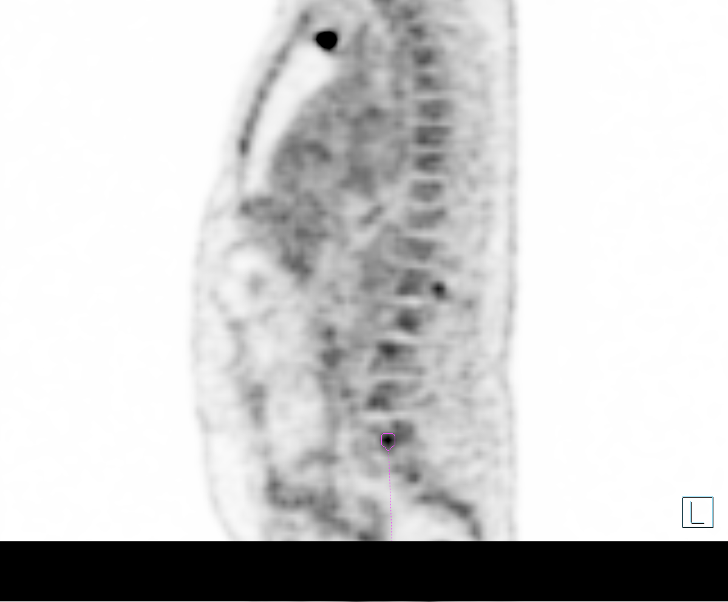
[im 14/14]
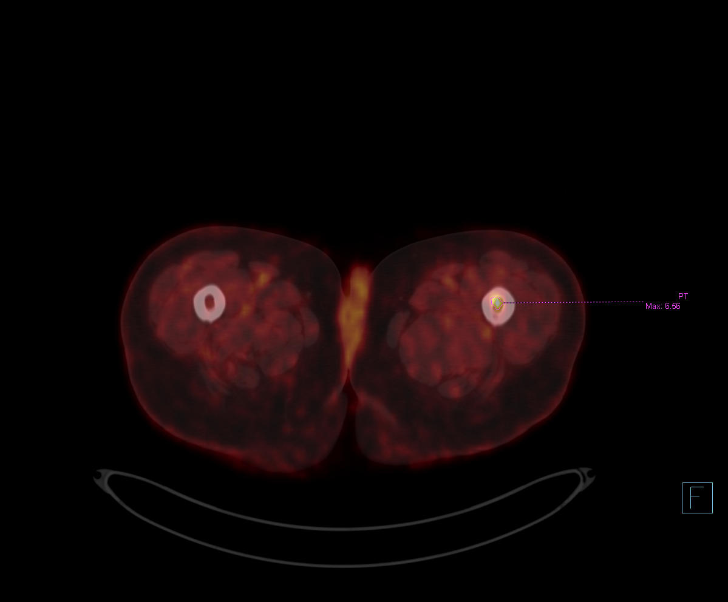

[12 of 14 positions shown; findings below may reference images not displayed]

FINDINGS: Mediastinal blood pool activity: SUV max

Liver activity: SUV max NA

NECK: LEFT level 4 lymph node just above the thoracic inlet (image
42, series 4) 8 mm (SUVmax = 7.0) no additional hypermetabolic lymph
nodes in the neck.

Incidental CT findings: none

CHEST: Hypermetabolic juxta hilar mass and nodal disease both in the
RIGHT hilum, contralateral mediastinum and thoracic inlet, also
associated with bony metastatic disease and adrenal metastases.

Masslike area in the RIGHT chest with ill-defined margins in is
nearly uniformly hypermetabolic (image 53, series [DATE] x 4.4 cm
(SUVmax = 13.3)

Anterior mediastinal lymph node on the LEFT lateral aspect of the
superior mediastinum (image 58, series 4) 1 cm (SUVmax = 19.3)

Small lymph node along the LEFT lateral aspect of the spine in the
posterior mediastinum adjacent to the aorta, behind heart (image 82,
series 4) (SUVmax = 17.5)

Similarly hypermetabolic lymph node in the subcarinal region (SUVmax
= 16.5) 2.1 cm

RIGHT paratracheal and 2 additional anterior mediastinal lymph nodes
displaying hypermetabolic features with similar FDG uptake. At least
3 small lymph nodes along the RIGHT paratracheal chain display
abnormal uptake. No contralateral hilar adenopathy no internal
mammary adenopathy

Incidental CT findings: Atheromatous plaque of the thoracic aorta.
Nonaneurysmal. No pericardial effusion. Background pulmonary
emphysema.

ABDOMEN/PELVIS: Bilateral adrenal lesions with increased metabolic
activity, largest on the LEFT measuring 2.0 x 3.3 cm (SUVmax = 22.
Smaller lesionw on the RIGHT associated with the body of the RIGHT
adrenal gland measuring less than a cm (SUVmax = 6.8) no additional
abnormal uptake within abdominal viscera.)

Incidental CT findings:

LEFT renal cysts. Aortic atherosclerosis without aneurysm. No
adenopathy by size criteria in the abdomen or in the
retroperitoneum.

No pelvic lymphadenopathy.

Post prostatectomy with signs of pelvic lymphadenectomy.

SKELETON: Multifocal bony metastatic disease. RIGHT proximal humerus
(image 36 of series 4 subtle abnormality in this location measuring
approximately 1 cm (SUVmax = 10.3)

LEFT second rib lesion (SUVmax = 3.6)

Focal area of increased FDG uptake in C3 with lucent area (SUVmax =
4.9 another focus of increased uptake in the anterior aspect of T1
adjacent degenerative change is indeterminate.)

LEFT pedicle uptake at T12 and posterior vertebral uptake at T1
suspicious for metastatic lesions. T12 uptake (SUVmax = 7.9)

Intense FDG uptake in the anterior aspect of the L2 vertebral body,
also involving L4, LEFT iliac crest, marrow space of the proximal
LEFT femur and the posterior LEFT seventh rib. There is also subtle
uptake in the anterior aspect of the RIGHT iliac crest.

Also with subtle uptake noted in the RIGHT distal clavicle.

Incidental CT findings: Spinal fusion in the cervical spine at
C4-C5.
IMPRESSION: Pulmonary neoplasm with nodal, visceral and bony metastases as
described.

Signs of prostatectomy with pelvic lymphadenectomy.

## 2022-02-19 IMAGING — DX DG CHEST 2V
3 series · 3 of 3 positions shown · non-contrast
Comparison: 09/07/2019

CLINICAL DATA: Fever.  Cough.

EXAM:
CHEST - 2 VIEW

[chest pa (1 of 2)]
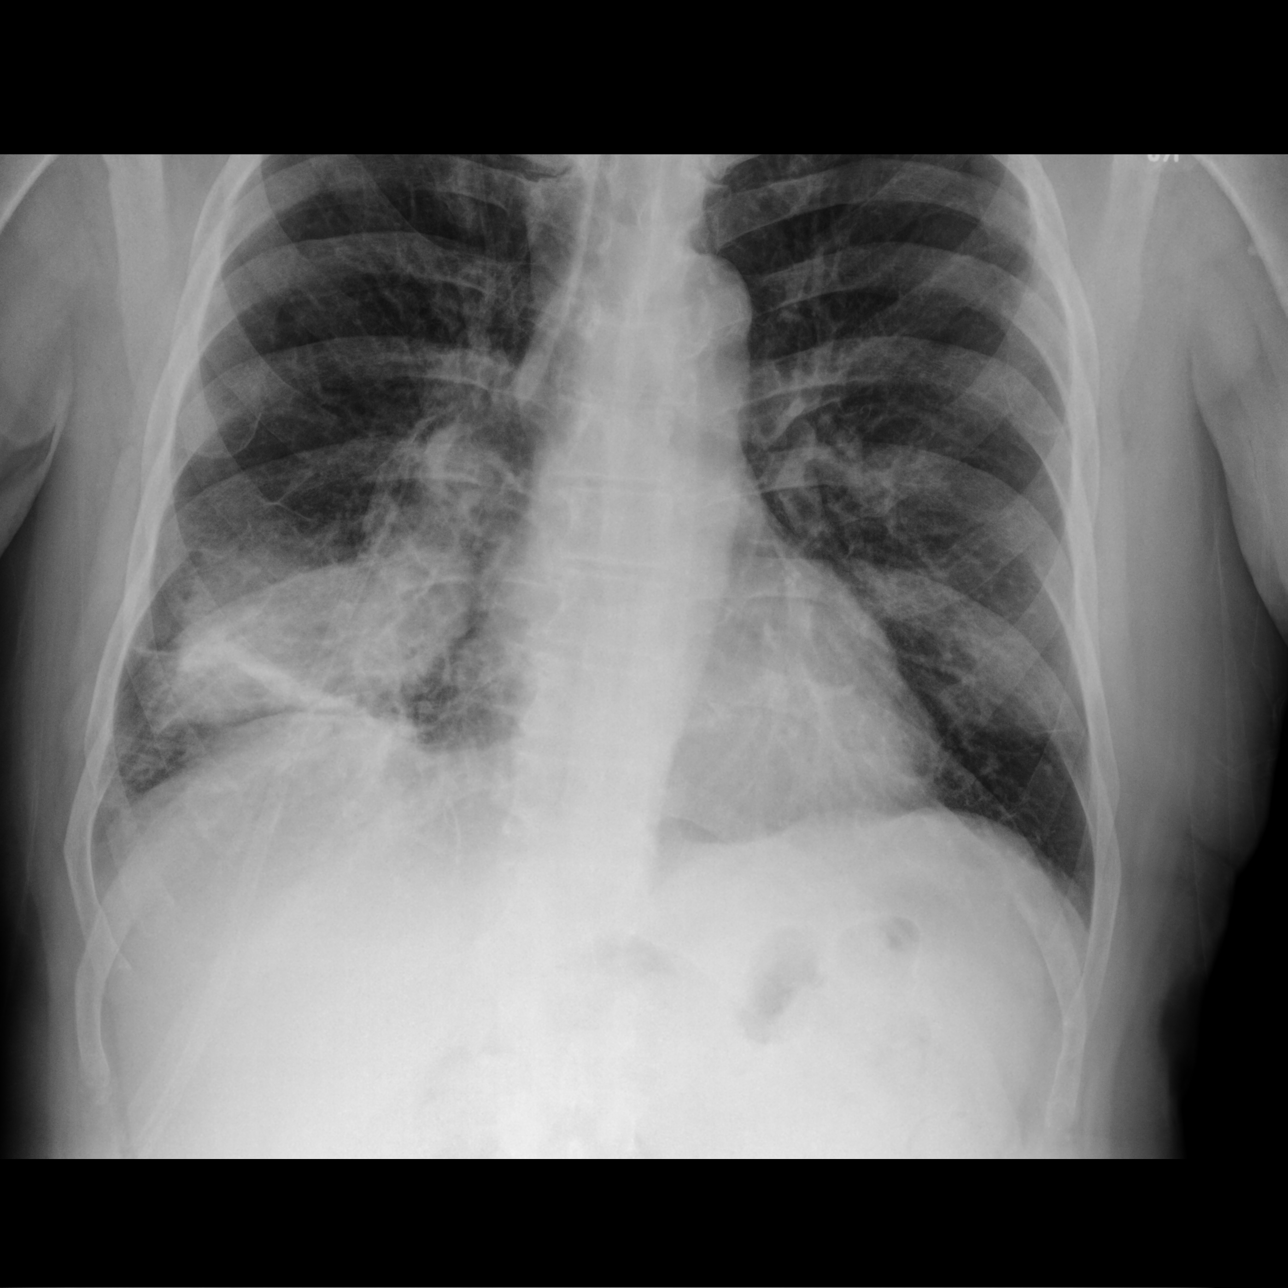

[chest lat]
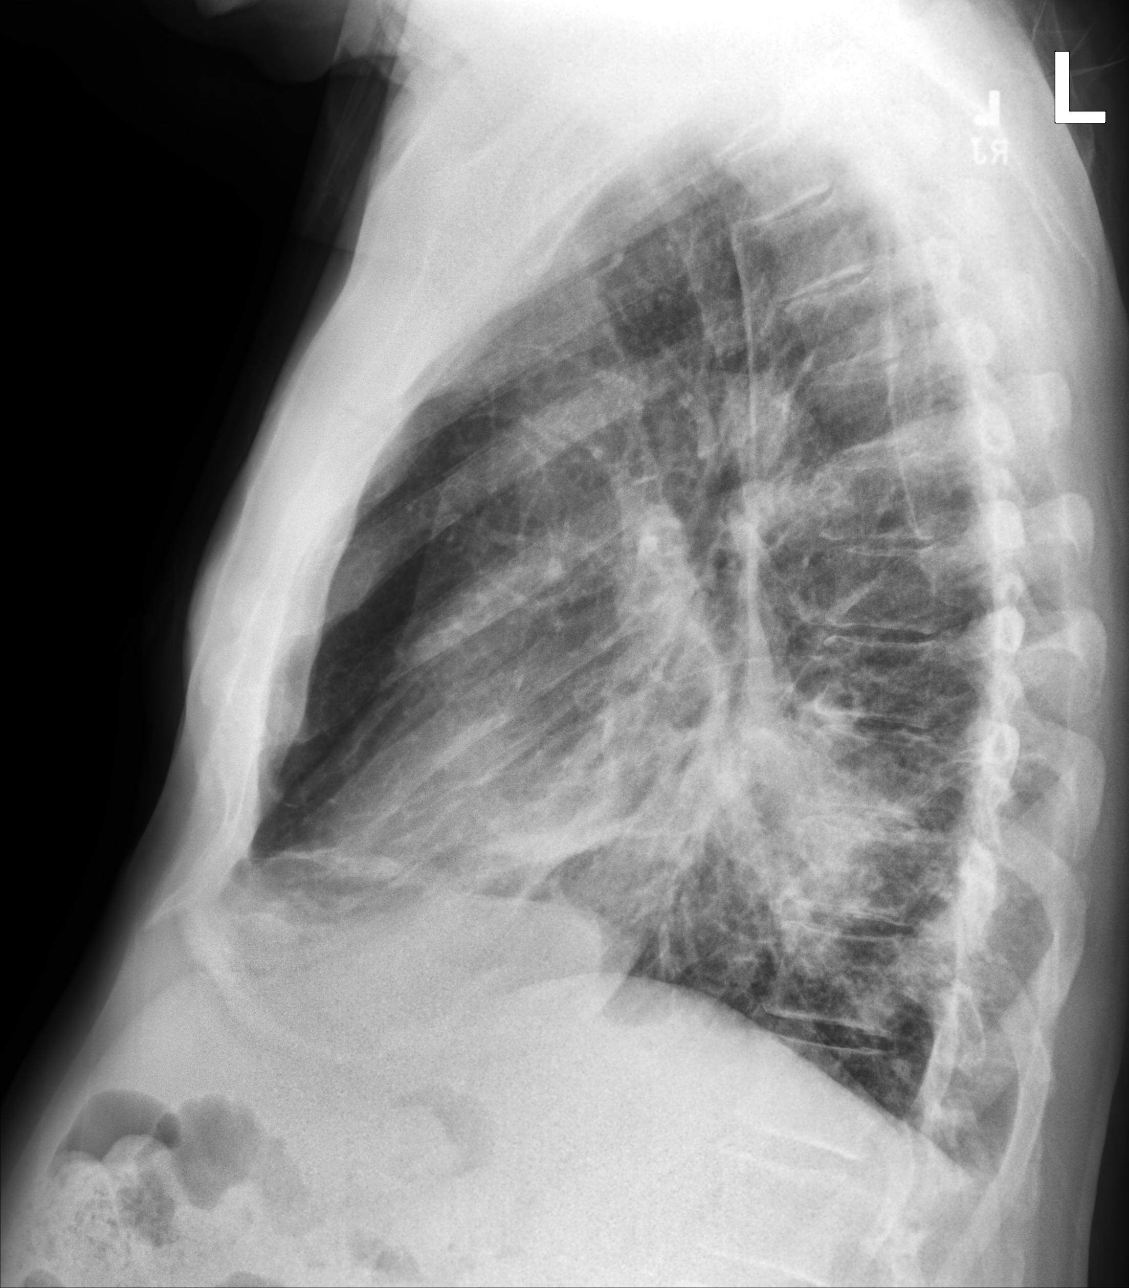

[chest pa (2 of 2)]
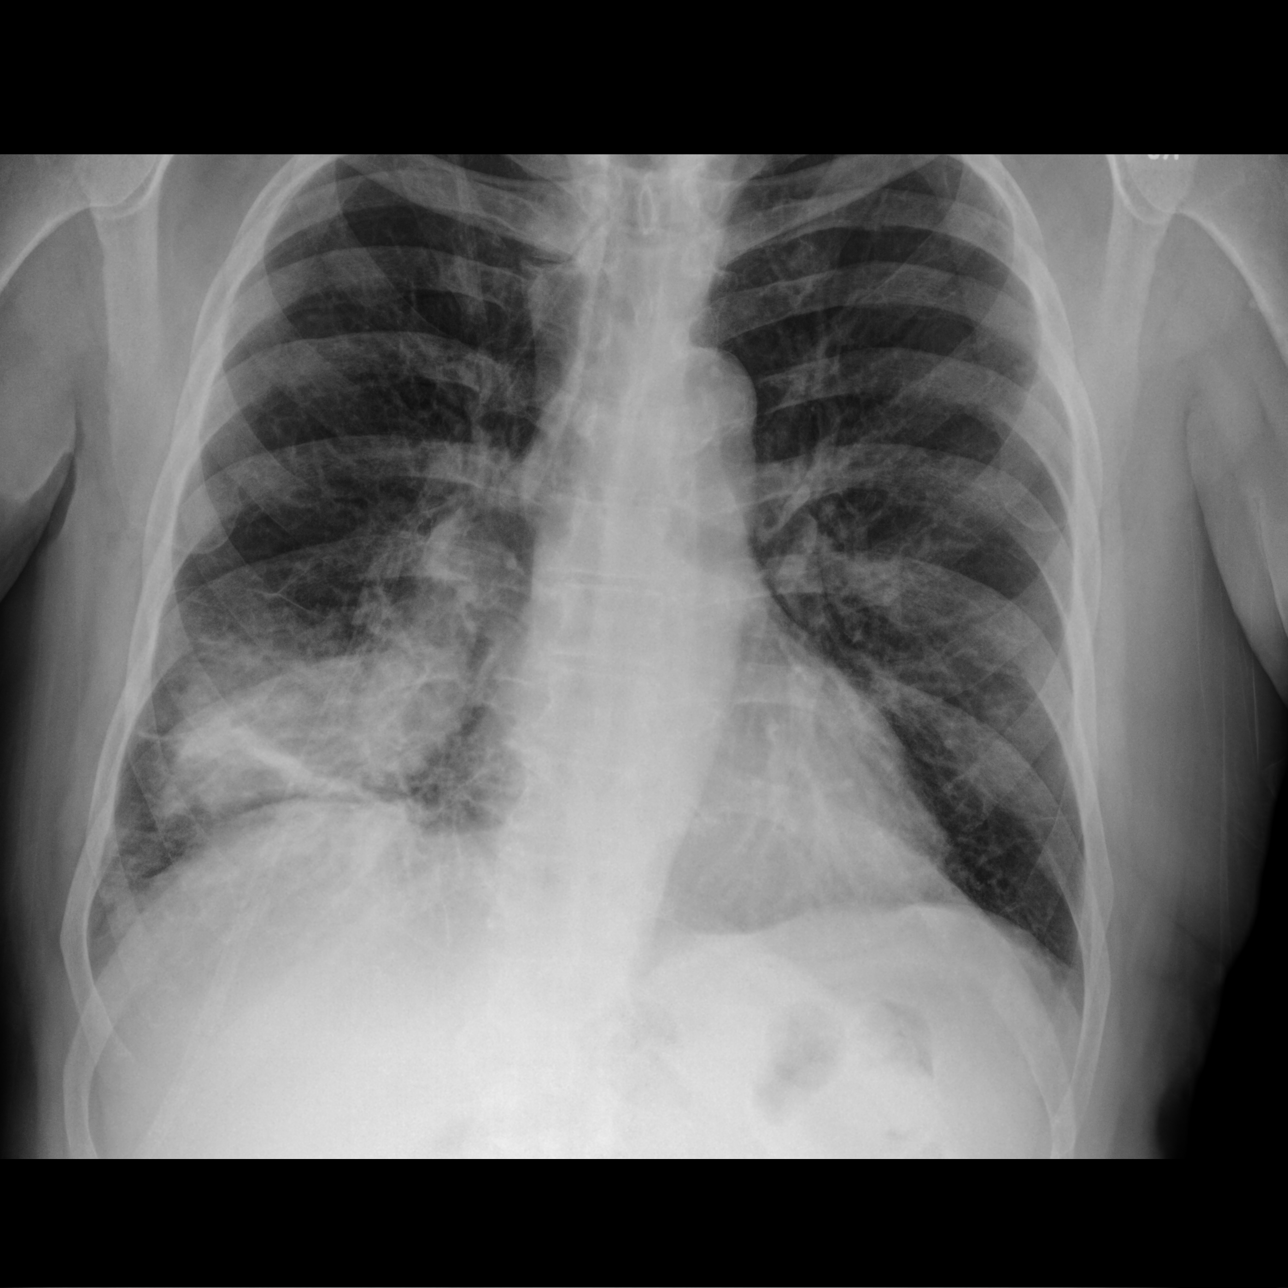

[3 of 3 positions shown; findings below may reference images not displayed]

FINDINGS: Midline trachea. Normal heart size and mediastinal contours.
Atherosclerosis in the transverse aorta. No pleural effusion or
pneumothorax. Slightly improved right lower lobe aeration. Possible
decrease in right lower lobe lung mass and/or surrounding airspace
disease. No new pulmonary opacity. Chronic interstitial thickening.
IMPRESSION: Improved right lower lobe aeration since 09/07/2019. This is at the
site of a right lower lobe lung mass detailed on PET. No evidence of
acute superimposed pneumonia or explanation for patient's symptoms.

Aortic Atherosclerosis ([id number]).

## 2023-01-15 NOTE — Telephone Encounter (Signed)
Telephone call
